# Patient Record
Sex: Female | Born: 1990 | Race: Black or African American | Hispanic: No | Marital: Single | Smoking: Never smoker
Health system: Southern US, Community
[De-identification: ages and names within clinical notes are randomized; demographics above are authoritative.]

## PROBLEM LIST (undated history)

## (undated) DIAGNOSIS — R4589 Other symptoms and signs involving emotional state: Secondary | ICD-10-CM

## (undated) DIAGNOSIS — R4689 Other symptoms and signs involving appearance and behavior: Secondary | ICD-10-CM

## (undated) DIAGNOSIS — R569 Unspecified convulsions: Secondary | ICD-10-CM

## (undated) DIAGNOSIS — F319 Bipolar disorder, unspecified: Secondary | ICD-10-CM

## (undated) DIAGNOSIS — I10 Essential (primary) hypertension: Secondary | ICD-10-CM

## (undated) DIAGNOSIS — G809 Cerebral palsy, unspecified: Secondary | ICD-10-CM

---

## 2017-07-10 ENCOUNTER — Emergency Department (HOSPITAL_COMMUNITY)
Admission: EM | Admit: 2017-07-10 | Discharge: 2017-07-12 | Disposition: A | Discharge status: Home / Self Care | Payer: Medicaid Other | Attending: Emergency Medicine | Admitting: Emergency Medicine

## 2017-07-10 ENCOUNTER — Encounter (HOSPITAL_COMMUNITY): Payer: Self-pay | Admitting: Nurse Practitioner

## 2017-07-10 ENCOUNTER — Other Ambulatory Visit: Payer: Self-pay

## 2017-07-10 DIAGNOSIS — R569 Unspecified convulsions: Secondary | ICD-10-CM | POA: Insufficient documentation

## 2017-07-10 DIAGNOSIS — G809 Cerebral palsy, unspecified: Secondary | ICD-10-CM | POA: Diagnosis not present

## 2017-07-10 DIAGNOSIS — R45851 Suicidal ideations: Secondary | ICD-10-CM | POA: Diagnosis not present

## 2017-07-10 DIAGNOSIS — Z008 Encounter for other general examination: Secondary | ICD-10-CM | POA: Diagnosis not present

## 2017-07-10 DIAGNOSIS — F23 Brief psychotic disorder: Secondary | ICD-10-CM | POA: Diagnosis not present

## 2017-07-10 DIAGNOSIS — F4325 Adjustment disorder with mixed disturbance of emotions and conduct: Secondary | ICD-10-CM | POA: Diagnosis not present

## 2017-07-10 DIAGNOSIS — I10 Essential (primary) hypertension: Secondary | ICD-10-CM | POA: Insufficient documentation

## 2017-07-10 DIAGNOSIS — R51 Headache: Secondary | ICD-10-CM | POA: Diagnosis not present

## 2017-07-10 DIAGNOSIS — R462 Strange and inexplicable behavior: Secondary | ICD-10-CM | POA: Diagnosis present

## 2017-07-10 HISTORY — DX: Cerebral palsy, unspecified: G80.9

## 2017-07-10 HISTORY — DX: Unspecified convulsions: R56.9

## 2017-07-10 HISTORY — DX: Essential (primary) hypertension: I10

## 2017-07-10 LAB — COMPREHENSIVE METABOLIC PANEL
ALT: 18 U/L (ref 14–54)
AST: 20 U/L (ref 15–41)
Albumin: 3 g/dL — ABNORMAL LOW (ref 3.5–5.0)
Alkaline Phosphatase: 48 U/L (ref 38–126)
Anion gap: 9 (ref 5–15)
BILIRUBIN TOTAL: 0.2 mg/dL — AB (ref 0.3–1.2)
BUN: 9 mg/dL (ref 6–20)
CHLORIDE: 108 mmol/L (ref 101–111)
CO2: 21 mmol/L — ABNORMAL LOW (ref 22–32)
CREATININE: 0.85 mg/dL (ref 0.44–1.00)
Calcium: 8.4 mg/dL — ABNORMAL LOW (ref 8.9–10.3)
GFR calc Af Amer: >60 mL/min (ref >60)
GFR calc non Af Amer: >60 mL/min (ref >60)
GLUCOSE: 86 mg/dL (ref 65–99)
POTASSIUM: 3.7 mmol/L (ref 3.5–5.1)
Sodium: 138 mmol/L (ref 135–145)
Total Protein: 6.8 g/dL (ref 6.5–8.1)

## 2017-07-10 LAB — URINALYSIS, ROUTINE W REFLEX MICROSCOPIC
BILIRUBIN URINE: NEGATIVE
Glucose, UA: NEGATIVE mg/dL
Ketones, ur: NEGATIVE mg/dL
Leukocytes, UA: NEGATIVE
Nitrite: NEGATIVE
Protein, ur: 30 mg/dL — AB
RBC / HPF: >50 RBC/hpf — ABNORMAL HIGH (ref 0–5)
SPECIFIC GRAVITY, URINE: 1.02 (ref 1.005–1.030)
pH: 5 (ref 5.0–8.0)

## 2017-07-10 LAB — CBC
HEMATOCRIT: 33 % — AB (ref 36.0–46.0)
Hemoglobin: 10.7 g/dL — ABNORMAL LOW (ref 12.0–15.0)
MCH: 26.4 pg (ref 26.0–34.0)
MCHC: 32.4 g/dL (ref 30.0–36.0)
MCV: 81.5 fL (ref 78.0–100.0)
Platelets: 206 10*3/uL (ref 150–400)
RBC: 4.05 MIL/uL (ref 3.87–5.11)
RDW: 13.3 % (ref 11.5–15.5)
WBC: 7.8 10*3/uL (ref 4.0–10.5)

## 2017-07-10 LAB — RAPID URINE DRUG SCREEN, HOSP PERFORMED
AMPHETAMINES: NOT DETECTED
BARBITURATES: NOT DETECTED
BENZODIAZEPINES: NOT DETECTED
Cocaine: NOT DETECTED
Opiates: NOT DETECTED
TETRAHYDROCANNABINOL: NOT DETECTED

## 2017-07-10 LAB — I-STAT BETA HCG BLOOD, ED (MC, WL, AP ONLY)

## 2017-07-10 LAB — SALICYLATE LEVEL: Salicylate Lvl: <7 mg/dL (ref 2.8–30.0)

## 2017-07-10 LAB — ETHANOL: Alcohol, Ethyl (B): <10 mg/dL (ref <10)

## 2017-07-10 LAB — ACETAMINOPHEN LEVEL: Acetaminophen (Tylenol), Serum: <10 ug/mL — ABNORMAL LOW (ref 10–30)
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
## 2017-07-10 NOTE — ED Notes (Signed)
Bed: WA30 Expected date:  Expected time:  Means of arrival:  Comments: Hall D 

## 2017-07-10 NOTE — ED Notes (Signed)
Case manager [phone] birindia

## 2017-07-10 NOTE — ED Notes (Signed)
TTS being completed. 

## 2017-07-10 NOTE — BH Assessment (Signed)
TTS attempted to contact case manager to get collateral information.  There was no answer and there was not a voicemail.  Will attempt to contact case manager at a later time.
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
## 2017-07-11 DIAGNOSIS — G40909 Epilepsy, unspecified, not intractable, without status epilepticus: Secondary | ICD-10-CM

## 2017-07-11 DIAGNOSIS — F4325 Adjustment disorder with mixed disturbance of emotions and conduct: Secondary | ICD-10-CM | POA: Diagnosis present

## 2017-07-11 DIAGNOSIS — Z79899 Other long term (current) drug therapy: Secondary | ICD-10-CM

## 2017-07-11 DIAGNOSIS — G809 Cerebral palsy, unspecified: Secondary | ICD-10-CM

## 2017-07-11 MED ORDER — ACETAMINOPHEN 325 MG PO TABS
650.0000 mg | ORAL_TABLET | Freq: Once | ORAL | Status: AC
  Administered 2017-07-11: 650 mg via ORAL
  Filled 2017-07-11: qty 2

## 2017-07-11 MED ORDER — GABAPENTIN 400 MG PO CAPS: 400.0000 mg | ORAL_CAPSULE | Freq: Three times a day (TID) | ORAL | 0 refills | Status: DC

## 2017-07-11 MED ORDER — GABAPENTIN 400 MG PO CAPS
400.0000 mg | ORAL_CAPSULE | Freq: Three times a day (TID) | ORAL | Status: DC
  Administered 2017-07-11 (×3): 400 mg via ORAL
  Filled 2017-07-11 (×3): qty 1

## 2017-07-11 NOTE — ED Notes (Signed)
Made EDP Jeraldine Loots aware that pt has headache and would like medication for it. Verbal order for Tylenol  PO given.

## 2017-07-11 NOTE — Discharge Instructions (Signed)
It was our pleasure to provide your ER care today - we hope that you feel better.  For mental health issues and/or crisis, go directly to [facility]. Also see resource guide provided for additional community resources.   Also follow up with primary care doctor in the coming week.   Return to ER if worse, new symptoms, fevers, worsening condition, other concern.
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
## 2017-07-11 NOTE — ED Notes (Signed)
[employer] notified of need for [employer] officer to do wellness check to the home of the patient to notify them that they must come get the patient.

## 2017-07-11 NOTE — Progress Notes (Addendum)
CSW awaiting return call from [facility] officer Floreen Comber as to whether [employer] has made family aware pt needs to be picked up.   [employer] officer Lucas Mallow can be reached at ph: [phone].  Pt's correct address is:  [address], [place] [address]  Pt's correct name is: Patricia [name] 11-02-1990 CSW will continue to follow for D/C needs.  CSW updated APS/CPS worker.  Dorothe Pea. Litzi Binning, LCSW, LCAS, CSI Clinical Social Worker Ph: [phone]

## 2017-07-11 NOTE — ED Notes (Signed)
NT made this RN aware while pt was using [employer] with speaking with social worker patient mentioned she would like medication for her headache.

## 2017-07-11 NOTE — ED Notes (Signed)
[employer] officer called TCU unit to Archivist that a wellness check was done and no family members were home at this time.

## 2017-07-11 NOTE — ED Notes (Signed)
Nanine Means, NP, said that pt might benefit from a Social Work Consult in the morning.
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
## 2017-07-11 NOTE — ED Notes (Signed)
Spoke with Dr. Denton Lank who said that pt can be discharged with outpt resources. She has had a Psychiatric, SW and TSS consult today. Outpt resources provided by Selena Batten with TTS.
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
## 2017-07-11 NOTE — ED Notes (Signed)
Christiane Ha Child psychotherapist at bedside with [employer]

## 2017-07-11 NOTE — Progress Notes (Addendum)
CSW spoke to [employer] who stated pt's brother is finding transporation to pick the pt now but [employer] is unsure of when it will exactly be.  If brother does not pick up, please call [phone] option 1 then option 3 and ask [employer] Dispatch to send a welfare check to pt's home to tell them to tell the pt's family up.  RN updated.   Please reconsult if future social work needs arise.  CSW signing off, as social work intervention is no longer needed.  Dorothe Pea. Brittne Kawasaki, LCSW, LCAS, CSI Clinical Social Worker Ph: [phone]
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
## 2017-07-11 NOTE — ED Notes (Addendum)
TTS at bedside with [employer]
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
## 2017-07-11 NOTE — ED Notes (Signed)
Patient resting in bed with her eyes closed. No signs of distress noted at this time. Sitter at bedside for safety.
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
## 2017-07-12 MED ORDER — CARBAMAZEPINE ER 200 MG PO TB12
400.0000 mg | ORAL_TABLET | Freq: Two times a day (BID) | ORAL | Status: DC
  Administered 2017-07-12: 400 mg via ORAL
  Filled 2017-07-12: qty 2

## 2017-07-12 MED ORDER — CARBAMAZEPINE ER 200 MG PO TB12: 200.0000 mg | ORAL_TABLET | Freq: Two times a day (BID) | ORAL | Status: DC

## 2017-07-12 NOTE — Progress Notes (Signed)
CSW contacted by [employer] [phone]) and informed that he made contact with Mymoni [phone]) at patient's residence and he agreed to pick patient up. Officer reported that he is uncertain of Mymoni's relationship to patient, noting he was the only one that spoke [place].   Patient is to be picked up by Mymoni [phone]). CSW updated patient's RN. CSW signing off, no other needs identified at this time.  Celso Sickle, [place] Clinical Social Worker [facility] Cell#: [phone]

## 2017-07-12 NOTE — Progress Notes (Signed)
Per chart review, patient was assessed by [facility] DSS worker on 07/11/17. Per chart review, "DSS worker has completed assessment of pts home and that it seems like a safe environment". Per chart review, multiple wellness checks have been performed to try to obtain transportation for patient.   CSW followed up with [facility] DSS worker (Mr. Manuela Neptune [phone]) that interviewed patient on 07/11/17 to clarify patient's ability to return home.  DSS worker reported that he obtained patient's mental health hx from family and reported that he feels patient needs a full psychiatric evaluation. CSW explained that patient was seen by psychiatrist and psychiatrically cleared. Per Mr. Freada Bergeron patient's family reported that patient has not been taking medicine and has been showing bizarre behaviors such as stripping. DSS worker reported that patient's home environment is suitable for patient to return home but does not feel patient is ready to return home due to current mental status.   CSW updated psychiatrist Jonnalagadda of reports from [facility] Worker, psychiatrist reported that patient has shown no behaviors at the hospital and there is no reason to re-evaluate patient. Per Psychiatrist Jonnalagadda, patient remains psychiatrically cleared. Patient to discharge home when family can be notified to pick patient up.  CSW contacted [employer] ([phone]) to conduct a wellness check to try to notify patient's family to obtain transportation for patient to discharge back home.   CSW awaiting return call from [facility] officer conducting wellness check.  CSW will continue to try to find transportation for patient to return home.  Celso Sickle, [place] Clinical Social Worker [facility] Cell#: [phone]

## 2017-07-14 ENCOUNTER — Other Ambulatory Visit: Payer: Self-pay

## 2017-07-14 ENCOUNTER — Ambulatory Visit (INDEPENDENT_AMBULATORY_CARE_PROVIDER_SITE_OTHER): Payer: Self-pay | Admitting: Family Medicine

## 2017-07-14 DIAGNOSIS — Z789 Other specified health status: Secondary | ICD-10-CM

## 2017-07-14 DIAGNOSIS — B1910 Unspecified viral hepatitis B without hepatic coma: Secondary | ICD-10-CM

## 2017-07-14 DIAGNOSIS — G40909 Epilepsy, unspecified, not intractable, without status epilepticus: Secondary | ICD-10-CM

## 2017-07-14 DIAGNOSIS — Z758 Other problems related to medical facilities and other health care: Secondary | ICD-10-CM

## 2017-07-14 DIAGNOSIS — Z0289 Encounter for other administrative examinations: Secondary | ICD-10-CM

## 2017-07-14 DIAGNOSIS — F819 Developmental disorder of scholastic skills, unspecified: Secondary | ICD-10-CM

## 2017-07-14 NOTE — Patient Instructions (Addendum)
It was good to see you today.  Come back and see me in about 1 month.    We will discuss your labs at that time from the Health Department.    I have referred you to a brain doctor.

## 2017-07-16 DIAGNOSIS — Z8619 Personal history of other infectious and parasitic diseases: Secondary | ICD-10-CM | POA: Insufficient documentation

## 2017-07-16 DIAGNOSIS — F819 Developmental disorder of scholastic skills, unspecified: Secondary | ICD-10-CM | POA: Insufficient documentation

## 2017-07-16 DIAGNOSIS — B1910 Unspecified viral hepatitis B without hepatic coma: Secondary | ICD-10-CM | POA: Insufficient documentation

## 2017-07-16 DIAGNOSIS — G40909 Epilepsy, unspecified, not intractable, without status epilepticus: Secondary | ICD-10-CM | POA: Insufficient documentation

## 2017-07-16 DIAGNOSIS — Z0289 Encounter for other administrative examinations: Secondary | ICD-10-CM | POA: Insufficient documentation

## 2017-07-16 DIAGNOSIS — Z789 Other specified health status: Secondary | ICD-10-CM | POA: Insufficient documentation

## 2017-07-16 NOTE — Assessment & Plan Note (Addendum)
On tegretol.  Awaiting carbamazepine level.  Will refer to neurology for any further recommendations as to her seizure regimen.  Not controlled, last was just prior to arriving in [name].  - sounds like true seizures -- tonic-clonic movements, LOC, post-ictal state afterwards.
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
## 2017-07-16 NOTE — Assessment & Plan Note (Addendum)
Seen at ED and diagnosed with adjustment disorder.  Prescribed Gapapentin but they never picked this up.  Has been doing better with transition since then per brother.   - Awaiting paperwork from health department.   - Per paperwork she was treated prophylactically and appropriately with Coartem for malaria, praziquantel for schisto, and albendazole for GI parasites.  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  If high absolute eosinophil count, will check titers for this.

## 2017-07-16 NOTE — Assessment & Plan Note (Signed)
Per overseas paperwork.  Awaiting lab work from health dept -- just obtained, not repeating yet so as not to duplicate labs.  May repeat next visit in 4 weeks pending paperwork.

## 2017-07-16 NOTE — Assessment & Plan Note (Signed)
Overall, mostly nonverbal due to cognitive delays.  However, can answer yes or no questions with [place] interpreter.
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
## 2017-07-17 ENCOUNTER — Telehealth: Payer: Self-pay

## 2017-07-17 NOTE — Telephone Encounter (Signed)
Maren Reamer, [facility] nurse assigned to [facility], would like to speak to PCP asap related to this patient and a seizure she witnessed while in the home this morning. She needs some guidance from PCP.  Call back is [phone].  Ples Specter, RN [facility] RN)

## 2017-07-18 NOTE — Telephone Encounter (Signed)
Attempted to call Marisue Humble back but had to leave voicemail.  Told her to send me an email (she has mine) or call when clinic reopens on Tuesday.  If sz's become recurrent or worsen, recommend patient go to ED to be evaluated.

## 2017-08-04 ENCOUNTER — Other Ambulatory Visit: Payer: Self-pay

## 2017-08-04 ENCOUNTER — Ambulatory Visit (INDEPENDENT_AMBULATORY_CARE_PROVIDER_SITE_OTHER): Payer: Medicaid Other | Admitting: Family Medicine

## 2017-08-04 ENCOUNTER — Encounter: Payer: Self-pay | Admitting: Family Medicine

## 2017-08-04 VITALS — BP 102/62 | HR 74 | Temp 98.3°F | Ht 62.0 in | Wt 162.8 lb

## 2017-08-04 DIAGNOSIS — G40909 Epilepsy, unspecified, not intractable, without status epilepticus: Secondary | ICD-10-CM | POA: Diagnosis not present

## 2017-08-04 DIAGNOSIS — Z0289 Encounter for other administrative examinations: Secondary | ICD-10-CM | POA: Diagnosis not present

## 2017-08-04 LAB — POCT URINE PREGNANCY: Preg Test, Ur: NEGATIVE

## 2017-08-04 NOTE — Patient Instructions (Addendum)
It was good to see you today.  Go to your Neurology (brain doctor) appointment this coming Thursday [facility] June [facility] at the scheduled time.

## 2017-08-04 NOTE — Progress Notes (Signed)
Subjective:    Patricia [name] is a 27 y.o. female who [facility] for FU from refugee clinic:  1.  Refugee clinic:  Doing well with transition.  She [facility], who contacted me last week as she is having worsening seizures.  Her brother, who is here with her today, corroborates the story.  Tonic-clonic seizures.  With post-ictal states, tongue chewing, incontinence.  She herself remains essentially nonverbal.  She is taking gabapentin for seizure control but it doesn't sound like this is really helping.    No acute changes other than ongoing seizures.  No fevers or chills.  Family is reportedly doing well with transition to life in the [place].    Her brother has tested positive for hep B.  We do not have health department records.  She has no scleral icterus or jaundice.    ROS as above per HPI.   The following portions of the patient's history were reviewed and updated as appropriate: allergies, current medications, past medical history, family and social history, and problem list. Patient is a nonsmoker.    PMH reviewed.  Past Medical History:  Diagnosis Date  . Cerebral palsy (HCC)   . Hypertension   . Seizures (HCC)    No past surgical history on file.  Medications reviewed. Current Outpatient Medications  Medication Sig Dispense Refill  . gabapentin (NEURONTIN) 400 MG capsule Take 1 capsule (400 mg total) by mouth 3 (three) times daily. 120 capsule 0   No current facility-administered medications for this visit.      Objective:   Physical Exam BP 102/62   Pulse 74   Temp 98.3 F (36.8 C) (Oral)   Ht 5\' 2"  (1.575 m)   Wt 162 lb 12.8 oz (73.8 kg)   LMP 06/11/2017 (Approximate)   SpO2 99%   BMI 29.78 kg/m  Gen:  Alert, cooperative patient who appears stated age in no acute distress.  Vital signs reviewed. HEENT: EOMI,  MMM Cardiac:  Regular rate and rhythm without murmur auscultated.  Good  S1/S2. Pulm:  Clear to auscultation bilaterally  Abd:  Soft/nondistended/nontender.  Good bowel sounds throughout all four quadrants.  No masses noted.  Exts: Non edematous BL  LE, warm and well perfused.  Neuro:  Alert.  Moves all limbs symmetrically.  No focal deficits noted.   No results found for this or any previous visit (from the past 72 hour(s)).

## 2017-08-05 ENCOUNTER — Encounter: Payer: Self-pay | Admitting: Family Medicine

## 2017-08-05 LAB — CBC WITH DIFFERENTIAL/PLATELET
BASOS: 0 %
Basophils Absolute: 0 10*3/uL (ref 0.0–0.2)
EOS (ABSOLUTE): 0.4 10*3/uL (ref 0.0–0.4)
EOS: 8 %
Hematocrit: 36.7 % (ref 34.0–46.6)
Hemoglobin: 11.8 g/dL (ref 11.1–15.9)
IMMATURE GRANS (ABS): 0 10*3/uL (ref 0.0–0.1)
IMMATURE GRANULOCYTES: 0 %
LYMPHS: 37 %
Lymphocytes Absolute: 2 10*3/uL (ref 0.7–3.1)
MCH: 25.8 pg — ABNORMAL LOW (ref 26.6–33.0)
MCHC: 32.2 g/dL (ref 31.5–35.7)
MCV: 80 fL (ref 79–97)
Monocytes Absolute: 0.5 10*3/uL (ref 0.1–0.9)
Monocytes: 9 %
NEUTROS PCT: 46 %
Neutrophils Absolute: 2.4 10*3/uL (ref 1.4–7.0)
PLATELETS: 232 10*3/uL (ref 150–450)
RBC: 4.58 x10E6/uL (ref 3.77–5.28)
RDW: 14.7 % (ref 12.3–15.4)
WBC: 5.3 10*3/uL (ref 3.4–10.8)

## 2017-08-05 LAB — COMPREHENSIVE METABOLIC PANEL
ALT: 18 IU/L (ref 0–32)
AST: 17 IU/L (ref 0–40)
Albumin/Globulin Ratio: 1.2 (ref 1.2–2.2)
Albumin: 4.1 g/dL (ref 3.5–5.5)
Alkaline Phosphatase: 61 IU/L (ref 39–117)
BUN/Creatinine Ratio: 10 (ref 9–23)
BUN: 8 mg/dL (ref 6–20)
Bilirubin Total: <0.2 mg/dL (ref 0.0–1.2)
CALCIUM: 9.2 mg/dL (ref 8.7–10.2)
CO2: 19 mmol/L — AB (ref 20–29)
CREATININE: 0.78 mg/dL (ref 0.57–1.00)
Chloride: 108 mmol/L — ABNORMAL HIGH (ref 96–106)
GFR calc Af Amer: 122 mL/min/{1.73_m2} (ref >59)
GFR, EST NON AFRICAN AMERICAN: 106 mL/min/{1.73_m2} (ref >59)
Globulin, Total: 3.3 g/dL (ref 1.5–4.5)
Glucose: 85 mg/dL (ref 65–99)
Potassium: 4.7 mmol/L (ref 3.5–5.2)
Sodium: 140 mmol/L (ref 134–144)
Total Protein: 7.4 g/dL (ref 6.0–8.5)

## 2017-08-05 LAB — RPR: RPR: NONREACTIVE

## 2017-08-05 LAB — VARICELLA ZOSTER ANTIBODY, IGG: VARICELLA: 830 {index} (ref >165)

## 2017-08-05 LAB — HIV ANTIBODY (ROUTINE TESTING W REFLEX): HIV Screen 4th Generation wRfx: NONREACTIVE

## 2017-08-05 LAB — TSH: TSH: 1.78 u[IU]/mL (ref 0.450–4.500)

## 2017-08-05 LAB — SICKLE CELL SCREEN: SICKLE CELL SCREEN: NEGATIVE

## 2017-08-05 NOTE — Assessment & Plan Note (Signed)
Not controlled.  She has appt scheduled this coming Thursday.  I have contacted the Congregational Nurse team who have ensured they will provide both transportation and interpreter for patient.

## 2017-08-05 NOTE — Assessment & Plan Note (Signed)
As per HPI -- the family seems to be doing well with the transition to the [place].  I see her parents as well.  I have contacted her liaison with Congregational nursing, who has reached out to the Case manager for [employer] regarding epilepsy treatment.

## 2017-08-06 ENCOUNTER — Telehealth: Payer: Self-pay | Admitting: *Deleted

## 2017-08-06 ENCOUNTER — Ambulatory Visit: Payer: Medicaid Other | Admitting: Neurology

## 2017-08-06 NOTE — Telephone Encounter (Signed)
Patient arrived early for her new patient appointment.  She was rescheduled by our office due to [facility] not arriving for the [facility]t.

## 2017-08-07 ENCOUNTER — Encounter: Payer: Self-pay | Admitting: Neurology

## 2017-08-13 ENCOUNTER — Encounter: Payer: Self-pay | Admitting: Neurology

## 2017-08-13 ENCOUNTER — Telehealth: Payer: Self-pay | Admitting: Neurology

## 2017-08-13 ENCOUNTER — Ambulatory Visit (INDEPENDENT_AMBULATORY_CARE_PROVIDER_SITE_OTHER): Payer: Medicaid Other | Admitting: Neurology

## 2017-08-13 VITALS — BP 93/66 | HR 88 | Wt 164.0 lb

## 2017-08-13 DIAGNOSIS — G40909 Epilepsy, unspecified, not intractable, without status epilepticus: Secondary | ICD-10-CM

## 2017-08-13 MED ORDER — LEVETIRACETAM 500 MG PO TABS: 500.0000 mg | ORAL_TABLET | Freq: Two times a day (BID) | ORAL | 3 refills | Status: DC

## 2017-08-13 NOTE — Patient Instructions (Signed)
We will get CT of the head and an EEG study.  We will start Keppra for the seizures.  Look out for drowsiness or irritability or anxiety.

## 2017-08-13 NOTE — Progress Notes (Signed)
Reason for visit: Seizures  Referring physician: Dr. Gwendolyn Grant  Patricia [name] is a 27 y.o. female  History of present illness:  Patricia [name] is a 27 year old left-handed African female with a history of seizures since age 49.  The patient comes in with her brother and an interpreter today.  The seizures were apparently of spontaneous onset, there is no history of head trauma.  There is no family history of seizures.  The patient has been noted to be somewhat cognitively slow throughout her life.  The patient is having about one seizure event a month.  The episodes are associated with dystonic posturing of the fingers of the hands, the patient would then begin to spin around and then she may fall down with a generalized seizure event.  The patient may resolve the seizure within 2 or 3 minutes, it may take her another minute or 2 to start speaking more normally without confusion.  The patient reports dizziness prior to the onset of the seizure.  The patient has no focal numbness or weakness of the face, arms, legs.  She has been on gabapentin taking 400 mg 3 times daily but this has not been effective and fully controlling the seizures.  She apparently has never had a scanning procedure such as CT or MRI of the brain, she has never had an EEG study.  She has 4 brothers and 6 sisters, one brother has hepatitis B, otherwise the siblings are in good health.  The patient comes into the office today for an evaluation.  Past Medical History:  Diagnosis Date  . Cerebral palsy (HCC)   . Hypertension   . Seizures (HCC)     History reviewed. No pertinent surgical history.  Family History  Problem Relation Age of Onset  . Hepatitis Brother     Social history:  reports that she has never smoked. She has never used smokeless tobacco. She reports that she drank alcohol. She reports that she has current or past drug history.  Medications:  Prior to Admission medications   Medication Sig Start Date  End Date Taking? Authorizing Provider  gabapentin (NEURONTIN) 400 MG capsule Take 1 capsule (400 mg total) by mouth 3 (three) times daily. 07/11/17  Yes Charm Rings, NP  levETIRAcetam (KEPPRA) 500 MG tablet Take 1 tablet (500 mg total) by mouth 2 (two) times daily. 08/13/17   York Spaniel, MD     No Known Allergies  ROS:  Out of a complete 14 system review of symptoms, the patient complains only of the following symptoms, and all other reviewed systems are negative.  Memory loss, headache, seizures, tremor Suicidal thoughts, hallucinations  Blood pressure 93/66, pulse 88, weight 164 lb (74.4 kg).  Physical Exam  General: The patient is alert and cooperative at the time of the examination.  Eyes: Pupils are equal, round, and reactive to light. Discs are flat bilaterally.  Neck: The neck is supple, no carotid bruits are noted.  Respiratory: The respiratory examination is clear.  Cardiovascular: The cardiovascular examination reveals a regular rate and rhythm, no obvious murmurs or rubs are noted.  Skin: Extremities are without significant edema.  Neurologic Exam  Mental status: The patient is alert and cooperative, the patient does not speak [place], she seems to have some difficulty following verbal commands.  Cranial nerves: Facial symmetry is present. There is good sensation of the face to pinprick and soft touch bilaterally. The strength of the facial muscles and the muscles to head  turning and shoulder shrug are normal bilaterally. Speech is well enunciated, no aphasia or dysarthria is noted. Extraocular movements are full. Visual fields are full by threat.  When counting fingers, the patient consistently misses the fingers on the right side with double simultaneous stimulation. The tongue is midline, and the patient has symmetric elevation of the soft palate. No obvious hearing deficits are noted.  Motor: The motor testing reveals 5 over 5 strength of all 4 extremities.  Good symmetric motor tone is noted throughout.  Sensory: Sensory testing is intact to pinprick, soft touch and vibration sensation on all 4 extremities. No evidence of extinction is noted.  Coordination: Cerebellar testing reveals good finger-nose-finger and heel-to-shin bilaterally.  Gait and station: Gait is normal. Tandem gait is normal. Romberg is negative. No drift is seen.  Reflexes: Deep tendon reflexes are symmetric and normal bilaterally. Toes are downgoing bilaterally.   Assessment/Plan:  1.  Intractable seizure disorder  2.  Mild mental retardation  The patient could potentially have a relative right homonymous visual field deficit by clinical examination.  The patient will be set up for CT scan of the brain with without contrast, I am not sure the patient will cooperate well enough for MRI evaluation.  The patient will have an EEG study.  She has been on gabapentin which is not a very effective antiepileptic medication.  She will be switched to Keppra taking 500 mg twice daily.  The patient will follow-up in 3 months.  They will call for any dose adjustments of the medication.  The gabapentin will be tapered by 1 capsule every 2 weeks until off the drug.  Marlan Palau. Keith Willis MD 08/13/2017 12:17 PM  [facility] [address] [place] [id number]  Phone [phone] Fax [phone]

## 2017-08-13 NOTE — Telephone Encounter (Signed)
Medicaid order sent to [facility]. They obtain the auth and will reach out to the pt to schedule.  °

## 2017-08-15 ENCOUNTER — Encounter (HOSPITAL_COMMUNITY): Payer: Self-pay | Admitting: Emergency Medicine

## 2017-08-15 ENCOUNTER — Emergency Department (HOSPITAL_COMMUNITY)
Admission: EM | Admit: 2017-08-15 | Discharge: 2017-08-18 | Disposition: A | Discharge status: Home / Self Care | Payer: Medicaid Other | Attending: Emergency Medicine | Admitting: Emergency Medicine

## 2017-08-15 DIAGNOSIS — I10 Essential (primary) hypertension: Secondary | ICD-10-CM | POA: Insufficient documentation

## 2017-08-15 DIAGNOSIS — R4182 Altered mental status, unspecified: Secondary | ICD-10-CM | POA: Diagnosis not present

## 2017-08-15 DIAGNOSIS — G809 Cerebral palsy, unspecified: Secondary | ICD-10-CM | POA: Insufficient documentation

## 2017-08-15 DIAGNOSIS — F4325 Adjustment disorder with mixed disturbance of emotions and conduct: Secondary | ICD-10-CM | POA: Diagnosis not present

## 2017-08-15 DIAGNOSIS — F919 Conduct disorder, unspecified: Secondary | ICD-10-CM | POA: Insufficient documentation

## 2017-08-15 DIAGNOSIS — R462 Strange and inexplicable behavior: Secondary | ICD-10-CM

## 2017-08-15 DIAGNOSIS — F322 Major depressive disorder, single episode, severe without psychotic features: Secondary | ICD-10-CM | POA: Insufficient documentation

## 2017-08-15 DIAGNOSIS — F068 Other specified mental disorders due to known physiological condition: Secondary | ICD-10-CM

## 2017-08-15 DIAGNOSIS — F23 Brief psychotic disorder: Secondary | ICD-10-CM

## 2017-08-15 DIAGNOSIS — F05 Delirium due to known physiological condition: Secondary | ICD-10-CM

## 2017-08-15 LAB — COMPREHENSIVE METABOLIC PANEL
ALT: 16 U/L (ref 14–54)
AST: 18 U/L (ref 15–41)
Albumin: 3.6 g/dL (ref 3.5–5.0)
Alkaline Phosphatase: 57 U/L (ref 38–126)
Anion gap: 4 — ABNORMAL LOW (ref 5–15)
BUN: 14 mg/dL (ref 6–20)
CHLORIDE: 110 mmol/L (ref 101–111)
CO2: 25 mmol/L (ref 22–32)
Calcium: 8.9 mg/dL (ref 8.9–10.3)
Creatinine, Ser: 0.83 mg/dL (ref 0.44–1.00)
Glucose, Bld: 101 mg/dL — ABNORMAL HIGH (ref 65–99)
POTASSIUM: 3.7 mmol/L (ref 3.5–5.1)
Sodium: 139 mmol/L (ref 135–145)
Total Bilirubin: 0.4 mg/dL (ref 0.3–1.2)
Total Protein: 7.4 g/dL (ref 6.5–8.1)

## 2017-08-15 LAB — RAPID URINE DRUG SCREEN, HOSP PERFORMED
AMPHETAMINES: NOT DETECTED
BENZODIAZEPINES: NOT DETECTED
Cocaine: NOT DETECTED
OPIATES: NOT DETECTED
Tetrahydrocannabinol: NOT DETECTED

## 2017-08-15 LAB — SALICYLATE LEVEL

## 2017-08-15 LAB — CBC
HCT: 35.7 % — ABNORMAL LOW (ref 36.0–46.0)
Hemoglobin: 11.8 g/dL — ABNORMAL LOW (ref 12.0–15.0)
MCH: 26.7 pg (ref 26.0–34.0)
MCHC: 33.1 g/dL (ref 30.0–36.0)
MCV: 80.8 fL (ref 78.0–100.0)
PLATELETS: 253 10*3/uL (ref 150–400)
RBC: 4.42 MIL/uL (ref 3.87–5.11)
RDW: 13.6 % (ref 11.5–15.5)
WBC: 6.2 10*3/uL (ref 4.0–10.5)

## 2017-08-15 LAB — I-STAT BETA HCG BLOOD, ED (MC, WL, AP ONLY): I-stat hCG, quantitative: <5 m[IU]/mL (ref <5)

## 2017-08-15 LAB — ACETAMINOPHEN LEVEL: Acetaminophen (Tylenol), Serum: <10 ug/mL — ABNORMAL LOW (ref 10–30)

## 2017-08-15 LAB — ETHANOL
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
## 2017-08-15 NOTE — ED Notes (Signed)
Patient doesn't respond on [employer] interpreter.

## 2017-08-16 ENCOUNTER — Emergency Department (HOSPITAL_COMMUNITY): Payer: Medicaid Other

## 2017-08-16 DIAGNOSIS — F23 Brief psychotic disorder: Secondary | ICD-10-CM

## 2017-08-16 DIAGNOSIS — F068 Other specified mental disorders due to known physiological condition: Secondary | ICD-10-CM

## 2017-08-16 DIAGNOSIS — F05 Delirium due to known physiological condition: Secondary | ICD-10-CM

## 2017-08-16 LAB — POC URINE PREG, ED: PREG TEST UR: NEGATIVE

## 2017-08-16 MED ORDER — HALOPERIDOL LACTATE 2 MG/ML PO CONC
0.5000 mg | Freq: Once | ORAL | Status: AC
  Administered 2017-08-16: 0.5 mg via ORAL
  Filled 2017-08-16: qty 0.3

## 2017-08-16 MED ORDER — HALOPERIDOL LACTATE 2 MG/ML PO CONC
0.5000 mg | Freq: Two times a day (BID) | ORAL | Status: DC
  Administered 2017-08-17 – 2017-08-18 (×3): 0.5 mg via ORAL
  Filled 2017-08-16 (×3): qty 0.3

## 2017-08-16 MED ORDER — HALOPERIDOL 0.5 MG PO TABS
0.5000 mg | ORAL_TABLET | Freq: Two times a day (BID) | ORAL | Status: DC
  Administered 2017-08-16: 0.5 mg via ORAL
  Filled 2017-08-16 (×4): qty 1

## 2017-08-16 MED ORDER — GABAPENTIN 400 MG PO CAPS
400.0000 mg | ORAL_CAPSULE | Freq: Three times a day (TID) | ORAL | Status: DC
  Administered 2017-08-16 – 2017-08-18 (×8): 400 mg via ORAL
  Filled 2017-08-16 (×8): qty 1

## 2017-08-16 MED ORDER — LEVETIRACETAM 500 MG PO TABS
500.0000 mg | ORAL_TABLET | Freq: Two times a day (BID) | ORAL | Status: DC
  Administered 2017-08-16 – 2017-08-18 (×5): 500 mg via ORAL
  Filled 2017-08-16 (×5): qty 1
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
## 2017-08-16 NOTE — ED Notes (Signed)
Pt states "there is nothing wrong with me, I was getting away from my mother and brothers." Asked to further explain, she states, "they are trying to hurt me." Denies pain. Declines answering if she is taking her psychiatric medicines, denies prior reports of "wanting to kill herself." Denies hearing voices. She is requesting a blanket and somewhere to sleep.
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
## 2017-08-16 NOTE — BH Assessment (Signed)
[facility]     08/16/17 Per Dr Jannifer FranklinAkintayo, patient will continued to be observed and monitored overnight for safety overnight with possible for discharge on 6/24 if her condition has improved

## 2017-08-17 DIAGNOSIS — F068 Other specified mental disorders due to known physiological condition: Secondary | ICD-10-CM | POA: Diagnosis not present

## 2017-08-17 NOTE — ED Notes (Signed)
Sleeping, only gets up to take medications
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
## 2017-08-18 DIAGNOSIS — F4325 Adjustment disorder with mixed disturbance of emotions and conduct: Secondary | ICD-10-CM | POA: Diagnosis not present

## 2017-08-18 MED ORDER — HALOPERIDOL LACTATE 2 MG/ML PO CONC: 0.5000 mg | Freq: Two times a day (BID) | ORAL | 0 refills | Status: DC
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
## 2017-08-18 NOTE — Discharge Instructions (Signed)
For your behavioral health needs, you are advised to continue treatment with your current outpatient provider.  If you do not have a provider, contact [facility].  New and returning patients are seen at their walk-in clinic.  Walk-in hours are Monday - Friday from 8:00 am - 3:00 pm.  Walk-in patients are seen on a first come, first served basis.  Try to arrive as early as possible for he best chance of being seen the same day:       [facility]      [address]      [place], [place] [address]      [phone]

## 2017-08-18 NOTE — BHH Suicide Risk Assessment (Signed)
Suicide Risk Assessment  Discharge Assessment   [facility]   Principal Problem: Adjustment disorder with mixed disturbance of emotions and conduct Discharge Diagnoses:  Patient Active Problem List   Diagnosis Date Noted  . Adjustment disorder with mixed disturbance of emotions and conduct [F43.25] 07/11/2017    Priority: High  . Post-ictal confusion [F05] 08/16/2017  . 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 Refugee health examination [Z02.89] 07/16/2017  . Language barrier [Z78.9] 07/16/2017  . Cognitive developmental delay [F81.9] 07/16/2017  . Seizure disorder ([facility]) [G40.909] 07/16/2017  . Hepatitis B [B19.10] 07/16/2017    Total Time spent with patient: 45 minutes  Musculoskeletal: Strength & Muscle Tone: within normal limits Gait & Station: normal Patient leans: N/A  Psychiatric Specialty Exam: Physical Exam  Nursing note and vitals reviewed. Constitutional: She is oriented to person, place, and time. She appears well-developed and well-nourished.  HENT:  Head: Normocephalic.  Neck: Normal range of motion.  Respiratory: Effort normal.  Musculoskeletal: Normal range of motion.  Neurological: She is alert and oriented to person, place, and time.  Psychiatric: She has a normal mood and affect. Her speech is normal and behavior is normal. Judgment and thought content normal. Cognition and memory are impaired.    Review of Systems  Psychiatric/Behavioral:       IDD, CP, and seizure d/o  All other systems reviewed and are negative.   Blood pressure (!) 94/51, pulse 71, temperature 97.9 F (36.6 C), temperature source Oral, resp. rate 16, SpO2 97 %.There is no height or weight on file to calculate BMI.  General Appearance: Casual  Eye Contact:  Good  Speech:  Normal Rate  Volume:  Normal  Mood:  Euthymic  Affect:  Congruent  Thought Process:  At her baseline  Orientation:  Other:  person  Thought Content:  WDL  Suicidal Thoughts:  No   Homicidal Thoughts:  No  Memory:  Immediate;   Fair Recent;   Fair Remote;   Fair  Judgement:  Fair  Insight:  Fair  Psychomotor Activity:  Decreased  Concentration:  Concentration: Fair and Attention Span: Fair  Recall:  FiservFair  Fund of Knowledge:  Fair  Language:  Fair  Akathisia:  No  Handed:  Right  AIMS (if indicated):     Assets:  Housing Leisure Time Physical Health Resilience Social Support  ADL's:  Intact  Cognition:  Impaired,  Moderate  Sleep:      Mental Status Per Nursing Assessment::   On Admission:   bizarre behaviors  Demographic Factors:  Adolescent or young adult  Loss Factors: NA  Historical Factors: NA  Risk Reduction Factors:   Sense of responsibility to family, Living with another person, especially a relative and Positive social support  Continued Clinical Symptoms:  None  Cognitive Features That Contribute To Risk:  None    Suicide Risk:  Minimal: No identifiable suicidal ideation.  Patients presenting with no risk factors but with morbid ruminations; may be classified as minimal risk based on the severity of the depressive symptoms    Plan Of Care/Follow-up recommendations:  Activity:  as tolerated Diet:  heart healthy diet  Omelia Marquart, NP 08/18/2017, 11:54 AM

## 2017-08-18 NOTE — ED Notes (Signed)
Belongings given back to pt from locker 29, one bag

## 2017-08-18 NOTE — Consult Note (Addendum)
[facility] Face-to-Face Psychiatry Consult   Reason for Consult:  Bizarre behaviors Referring Physician:  EDP Patient Identification: Patricia [name] MRN:  [id number] Principal Diagnosis: Adjustment disorder with mixed disturbance of emotions and conduct Diagnosis:   Patient Active Problem List   Diagnosis Date Noted  . Adjustment disorder with mixed disturbance of emotions and conduct [F43.25] 07/11/2017    Priority: High  . Post-ictal confusion [F05] 08/16/2017  . 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 Refugee health examination [Z02.89] 07/16/2017  . Language barrier [Z78.9] 07/16/2017  . Cognitive developmental delay [F81.9] 07/16/2017  . Seizure disorder (HCC) [G40.909] 07/16/2017  . Hepatitis B [B19.10] 07/16/2017    Total Time spent with patient: 30 minutes  Subjective:   Patricia [name] is a 27 y.o. female patient has stabilized.  HPI: 27 yo female who presented to the ED after bizarre behaviors.  Medications were started and she stabilized.  She was believed to be in a post-tical state on admission.  Compliant with medications with no threats to self or others, negative drug use.  Stable to return to the care of her family.  Past Psychiatric History: IDD, seizure disorder  Risk to Self: None Risk to Others: Homicidal Ideation: No Thoughts of Harm to Others: No Current Homicidal Intent: No Current Homicidal Plan: No Access to Homicidal Means: No Identified Victim: none History of harm to others?: No Assessment of Violence: None Noted Violent Behavior Description: none Does patient have access to weapons?: No Criminal Charges Pending?: No Does patient have a court date: No Prior Inpatient Therapy: Prior Inpatient Therapy: No Prior Outpatient Therapy: Prior Outpatient Therapy: (UTA) Does patient have an ACCT team?: No Does patient have Intensive In-House Services?  : No Does patient have [facility] services? : No Does patient have P4CC services?: No  Past Medical  History:  Past Medical History:  Diagnosis Date  . Cerebral palsy (HCC)   . Hypertension   . Seizures (HCC)    History reviewed. No pertinent surgical history. Family History:  Family History  Problem Relation Age of Onset  . Hepatitis Brother    Family Psychiatric  History: none Social History:  Social History   Substance and Sexual Activity  Alcohol Use Not Currently     Social History   Substance and Sexual Activity  Drug Use Not Currently    Social History   Socioeconomic History  . Marital status: Single    Spouse name: Not on file  . Number of children: 1  . Years of education: Not on file  . Highest education level: Not on file  Occupational History    Comment: none  Social Needs  . Financial resource strain: Not on file  . Food insecurity:    Worry: Not on file    Inability: Not on file  . Transportation needs:    Medical: Not on file    Non-medical: Not on file  Tobacco Use  . Smoking status: Never Smoker  . Smokeless tobacco: Never Used  Substance and Sexual Activity  . Alcohol use: Not Currently  . Drug use: Not Currently  . Sexual activity: Not on file  Lifestyle  . Physical activity:    Days per week: Not on file    Minutes per session: Not on file  . Stress: Not on file  Relationships  . Social connections:    Talks on phone: Not on file    Gets together: Not on file    Attends religious service: Not on file  Active member of club or organization: Not on file    Attends meetings of clubs or organizations: Not on file    Relationship status: Not on file  Other Topics Concern  . Not on file  Social History Narrative   Lives with mother, family   Additional Social History:N/A    Allergies:  No Known Allergies  Labs: No results found for this or any previous visit (from the past 48 hour(s)).  Current Facility-Administered Medications  Medication Dose Route Frequency Provider Last Rate Last Dose  . gabapentin (NEURONTIN) capsule  400 mg  400 mg Oral TID Nira Conn, MD   400 mg at 08/18/17 0947  . haloperidol (HALDOL) 2 MG/ML solution 0.5 mg  0.5 mg Oral BID Thedore Mins, MD   0.5 mg at 08/18/17 0946  . levETIRAcetam (KEPPRA) tablet 500 mg  500 mg Oral BID Nira Conn, MD   500 mg at 08/18/17 1610   Current Outpatient Medications  Medication Sig Dispense Refill  . gabapentin (NEURONTIN) 400 MG capsule Take 1 capsule (400 mg total) by mouth 3 (three) times daily. 120 capsule 0  . levETIRAcetam (KEPPRA) 500 MG tablet Take 1 tablet (500 mg total) by mouth 2 (two) times daily. 60 tablet 3    Musculoskeletal: Strength & Muscle Tone: within normal limits Gait & Station: normal Patient leans: N/A  Psychiatric Specialty Exam: Physical Exam  Nursing note and vitals reviewed. Constitutional: She is oriented to person, place, and time. She appears well-developed and well-nourished.  HENT:  Head: Normocephalic.  Neck: Normal range of motion.  Respiratory: Effort normal.  Musculoskeletal: Normal range of motion.  Neurological: She is alert and oriented to person, place, and time.  Psychiatric: She has a normal mood and affect. Her speech is normal and behavior is normal. Judgment and thought content normal. Cognition and memory are impaired.    Review of Systems  Psychiatric/Behavioral: Negative for suicidal ideas.       IDD by history, CP, and seizure d/o  All other systems reviewed and are negative.   Blood pressure (!) 94/51, pulse 71, temperature 97.9 F (36.6 C), temperature source Oral, resp. rate 16, SpO2 97 %.There is no height or weight on file to calculate BMI.  General Appearance: Casual  Eye Contact:  Good  Speech:  Normal Rate  Volume:  Normal  Mood:  Euthymic  Affect:  Congruent  Thought Process:  At her baseline  Orientation:  Other:  person  Thought Content:  WDL  Suicidal Thoughts:  No  Homicidal Thoughts:  No  Memory:  Immediate;   Fair Recent;   Fair Remote;    Fair  Judgement:  Fair  Insight:  Fair  Psychomotor Activity:  Decreased  Concentration:  Concentration: Fair and Attention Span: Fair  Recall:  [employer] of Knowledge:  Fair  Language:  Fair  Akathisia:  No  Handed:  Right  AIMS (if indicated):   N/A  Assets:  Housing Leisure Time Resilience Social Support  ADL's:  Intact  Cognition:  Impaired,  Moderate  Sleep:   N/A     Treatment Plan Summary: Adjustment disorder with mixed disturbance of emotions and conduct: Started Haldol 0.5 mg BID for mood stabilization  Disposition: No evidence of imminent risk to self or others at present.    Nanine Means, NP 08/18/2017 11:14 AM   Patient seen face-to-face for psychiatric evaluation, chart reviewed and case discussed with the physician extender and developed treatment plan. Reviewed the information  documented and agree with the treatment plan.  Juanetta BeetsJacqueline Gus Littler, DO 08/18/17 5:07 PM

## 2017-08-18 NOTE — Progress Notes (Addendum)
11:47am- CSW spoke with pt's brother via [employer] [phone]. CSW was informed by Crissie ReeseHaifa [phone] (intepretor) that brother would be to pick pt up today at 3pm. There are no further CSW needs. CSW will sign off.   CSW received call from Tom with TTS informing CSW that [facility]. SCW reached out to number int hr chart and left VM asking that this person call CSW back regarding pt. CSW reached out to number in LCSW Note Christiane Ha(Jonathan) but no answer. CSW will continue to reach out to person to pick pt up.   Claude MangesKierra S. Izzabella Besse, MSW, LCSW-A Emergency Department Clinical Social Worker [phone]

## 2017-08-26 ENCOUNTER — Ambulatory Visit: Payer: Medicaid Other

## 2017-09-08 ENCOUNTER — Ambulatory Visit (INDEPENDENT_AMBULATORY_CARE_PROVIDER_SITE_OTHER): Payer: Medicaid Other

## 2017-09-08 DIAGNOSIS — R569 Unspecified convulsions: Secondary | ICD-10-CM | POA: Diagnosis not present

## 2017-09-13 ENCOUNTER — Telehealth: Payer: Self-pay | Admitting: Neurology

## 2017-09-13 NOTE — Procedures (Signed)
     History: Patricia [name] is a 27 year old patient with a history of seizures since age 27.  The seizures were of spontaneous onset, and have been associated with mild mental retardation.  The patient has a history of cerebral palsy.  The episodes are associated with dystonic posturing of the fingers and hands, the patient may fall down with the seizure, she may begin to spin around before she falls.  The patient is being evaluated for these events.  This is a routine EEG.  No skull defects are noted.  Medications include Keppra and gabapentin.  EEG classification: Dysrhythmia grade 2 right hemisphere  Description of the recording: The background rhythm of this recording consists of a somewhat poorly modulated medium amplitude 10 Hz background activity that is reactive to eye opening and closure.  As the record progresses, there appears to be a relatively frequent asymmetry of slowing involving the right hemisphere predominantly with a slightly higher amplitude on the right.  At times, the 3 to 5 Hz slowing becomes much higher in amplitude and more prominent throughout the right hemisphere, at sometimes crossing over to the left temporal region as well.  At no time during the recording does there appear to be spike or spike-wave discharges.  Hyperventilation is performed, this results in a mild elevation and generalized slowing.  Photic stimulation is performed and results in a bilateral and symmetric photic driving response.  EKG monitor shows no evidence of cardiac rhythm abnormalities with a heart rate of 66.  Impression: This is an abnormal EEG recording secondary to right hemispheric slowing.  This study suggests a right brain abnormality with a lowered seizure threshold.  No electrographic seizures were recorded however.

## 2017-09-13 NOTE — Telephone Encounter (Signed)
I called concerning the patient.  The CT of the head was unremarkable, no structural abnormalities that would cause seizures.  The EEG study clearly is abnormal with slowing across the right hemisphere, this is likely the source of the seizures.  The patient will continue the Keppra, if the seizures continue they are to call for dose adjustments, or call if she is not tolerating the medication.  For whatever reason, the CT of the head was not done with contrast.  It was ordered with and without contrast.  It looks as if the CT scan was done through the emergency room.   CT head 08/16/17:  IMPRESSION: 1. No acute intracranial process. 2. Chronic sinus disease as above.   EEG 09/08/17:  Impression: This is an abnormal EEG recording secondary to right hemispheric slowing.  This study suggests a right brain abnormality with a lowered seizure threshold.  No electrographic seizures were recorded however.

## 2017-10-14 ENCOUNTER — Encounter: Payer: Self-pay | Admitting: Licensed Clinical Social Worker

## 2017-10-14 NOTE — Progress Notes (Signed)
Type of Service: Clinical Social Work  Social work consult from Dr. Gwendolyn GrantWalden reference Personal Care Services for patient.   Provider has spoken with patient and family. They are interested in moving forward with applying for the service. LCSW received completed and signed new request form for personal care services from Dr Gwendolyn GrantWalden.   Form [facility] to [employer] [employer] at [phone] by front office staff.   Patricia Hineseborah Lunette Tapp, LCSW Licensed Clinical Social Worker [facility]   [phone] 8:54 AM

## 2017-10-15 ENCOUNTER — Emergency Department (HOSPITAL_COMMUNITY)
Admission: EM | Admit: 2017-10-15 | Discharge: 2017-10-15 | Disposition: A | Discharge status: Home / Self Care | Payer: Medicaid Other | Attending: Emergency Medicine | Admitting: Emergency Medicine

## 2017-10-15 ENCOUNTER — Encounter (HOSPITAL_COMMUNITY): Payer: Self-pay | Admitting: Emergency Medicine

## 2017-10-15 ENCOUNTER — Ambulatory Visit: Payer: Medicaid Other | Admitting: Neurology

## 2017-10-15 ENCOUNTER — Other Ambulatory Visit: Payer: Self-pay

## 2017-10-15 DIAGNOSIS — F99 Mental disorder, not otherwise specified: Secondary | ICD-10-CM | POA: Diagnosis present

## 2017-10-15 DIAGNOSIS — G809 Cerebral palsy, unspecified: Secondary | ICD-10-CM

## 2017-10-15 DIAGNOSIS — I10 Essential (primary) hypertension: Secondary | ICD-10-CM | POA: Diagnosis not present

## 2017-10-15 DIAGNOSIS — F4325 Adjustment disorder with mixed disturbance of emotions and conduct: Secondary | ICD-10-CM | POA: Insufficient documentation

## 2017-10-15 DIAGNOSIS — Z79899 Other long term (current) drug therapy: Secondary | ICD-10-CM | POA: Diagnosis not present

## 2017-10-15 MED ORDER — LEVETIRACETAM 500 MG PO TABS: 500.0000 mg | ORAL_TABLET | Freq: Two times a day (BID) | ORAL | Status: DC

## 2017-10-15 MED ORDER — HALOPERIDOL LACTATE 2 MG/ML PO CONC
0.5000 mg | Freq: Two times a day (BID) | ORAL | Status: DC
  Filled 2017-10-15: qty 0.3

## 2017-10-15 NOTE — ED Triage Notes (Signed)
Pt brought by ambulance and [employer], pt was wondering in street taking to self. Hx psych issues., lives with family . Pt is is confused, word salad. Calm and cooperative at this time. No aggressive behavior with EMS. alert yet disoriented.

## 2017-10-15 NOTE — ED Provider Notes (Signed)
Family arrived and is here to take patient home.  Patient is doing well.  Family member has no concerns.  Patient has cerebral palsy/MR and has been known to walk away from family home.  Patient has no complaints and discharged in good condition.   Virgina NorfolkCuratolo, Wren Pryce, DO 10/15/17 2112

## 2017-10-15 NOTE — ED Notes (Signed)
Brother in department, speaks [place].

## 2017-10-15 NOTE — ED Notes (Signed)
Received return call from RidgeburySari, Case Mgr from [phone].  She stated "I have called her brothers and they are on the way to get her."  Requested she hold on to speak to EDP if needed.  Spoke with Dr. Lockie Molauratolo, he stated "I don't need anything, I'm going to cancel the blood, let me know when her brothers arrive and I will d/c her."  Sari, Case Mgr. Informed of conversation with EDP and verbalized understanding.

## 2017-10-15 NOTE — ED Notes (Signed)
Dalled [phone], received recording.  Message left requesting return call.

## 2017-10-15 NOTE — ED Notes (Signed)
Called [phone], left message requesting return call.

## 2017-10-15 NOTE — ED Notes (Signed)
Pt speaks swahili only, EDP at bedside, spoke to family social worker, pt is known MR , family in route to get pt and no need for psych eval per EDP.

## 2017-10-15 NOTE — ED Notes (Signed)
This nursed tried calling back family friend and no response

## 2017-10-15 NOTE — ED Notes (Signed)
Bed: WA28 Expected date:  Expected time:  Means of arrival:  Comments: EMS-hallucinations 

## 2017-10-15 NOTE — ED Provider Notes (Signed)
Family does not get off work until [employer]5pm. They know to come to the ER   Azalia Bilisampos, Shane Badeaux, MD 10/15/17 1615
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
## 2017-10-15 NOTE — ED Provider Notes (Signed)
[facility]-EMERGENCY DEPT Provider Note   CSN: [id number] Arrival date & time: 10/15/17  1219     History   Chief Complaint Chief Complaint  Patient presents with  . Psychiatric Evaluation    HPI Airanna Cambria Osten is a 27 y.o. female.  HPI 27 year old female who was found wandering in the street speaking to her self.  She is brought to the ER for evaluation.  It is reported that the patient was confused but she speaks Swahili and no interpreter was utilized.  Patient is calm and cooperative at this time.  Review of the chart demonstrates that she has mild MR and a history of cerebral palsy.  She is a recent immigrant to this country.  I spoke with her case manager who will contact family and family and/or the case manager will come and provide additional collateral information here in the emergency department.   Past Medical History:  Diagnosis Date  . Cerebral palsy (HCC)   . Hypertension   . Seizures [facility])     Patient Active Problem List   Diagnosis Date Noted  . Post-ictal confusion 08/16/2017  . 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 Refugee health examination 07/16/2017  . Language barrier 07/16/2017  . Cognitive developmental delay 07/16/2017  . Seizure disorder (HCC) 07/16/2017  . Hepatitis B 07/16/2017  . 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 No pertinent surgical history.   OB History   None      Home Medications    Prior to Admission medications   Medication Sig Start Date End Date Taking? Authorizing Provider  gabapentin (NEURONTIN) 400 MG capsule Take 1 capsule (400 mg total) by mouth 3 (three) times daily. 07/11/17   Charm Rings, NP  haloperidol (HALDOL) 2 MG/ML solution Take 0.3 mLs (0.6 mg total) by mouth 2 (two) times daily. 08/18/17   Charm Rings, NP  levETIRAcetam (KEPPRA) 500 MG tablet Take 1 tablet (500 mg total) by mouth 2 (two) times daily. 08/13/17   York Spaniel, MD    Family History Family History  Problem Relation Age of Onset  . Hepatitis Brother     Social History Social History   Tobacco Use  . Smoking status: Never Smoker  . Smokeless tobacco: Never Used  Substance Use Topics  . Alcohol use: Not Currently  . Drug use: Not Currently     Allergies   Patient has no known allergies.   Review of Systems Review of Systems  All other systems reviewed and are negative.    Physical Exam Updated Vital Signs There were no vitals taken for this visit.  Physical Exam  Constitutional: She is oriented to person, place, and time. She appears well-developed and well-nourished.  HENT:  Head: Normocephalic.  Eyes: EOM are normal.  Neck: Normal range of motion.  Cardiovascular: Normal rate.  Pulmonary/Chest: Effort normal and breath sounds normal.  Abdominal: Soft. She exhibits no distension.  Musculoskeletal: Normal range of motion.  Neurological: She is alert and oriented to person, place, and time.  Psychiatric: She has a normal mood and affect.  Nursing note and vitals reviewed.    ED Treatments / Results  Labs (all labs ordered are listed, but only abnormal results are displayed) Labs Reviewed - No data to display  EKG None  Radiology No results found.  Procedures Procedures (including critical care time)  Medications Ordered in ED Medications - No data to display   Initial  Impression / Assessment and Plan / ED Course  I have reviewed the triage vital signs and the nursing notes.  Pertinent labs & imaging results that were available during my care of the patient were reviewed by me and considered in my medical decision making (see chart for details).     Baseline labs will be obtained.  Awaiting additional information from family who are set to come to the emergency department.  Final Clinical Impressions(s) / ED Diagnoses   Final diagnoses:  None    ED Discharge Orders    None         Azalia Bilisampos, Chandni Gagan, MD 10/15/17 [phone]

## 2017-10-15 NOTE — ED Notes (Addendum)
Spoke to a family member, supposed to come pick patient up when gets out of work at 5 pm. Dr. Patria Maneampos stated to this nurse verbally to have family assess patient and if she is not mentally at her norm then collect labs if not patient can go home

## 2017-10-15 NOTE — ED Notes (Signed)
Called [phone], left message requesting return call.

## 2017-11-30 NOTE — Progress Notes (Signed)
[facility]  PATIENT: Fidencia Mccloud DOB: 31-Aug-1990   REASON FOR VISIT: Follow-up for seizure disorder HISTORY FROM: Interpreter brother and case worker    HISTORY OF PRESENT ILLNESS:UPDATE 10/9/2019CM Ms.Denner, 27 year old female returns for follow-up with a history of seizure disorder since the age of 36.  She speaks Swahili and has an Equities trader.  She also has a history of being cognitively slow.  Her seizure events are associated with dystonic posturing of the fingers in the hands and then fall with a generalized seizure event.  The seizure usually resolves in 2 to 3 minutes.  CT of the head performed 08/16/2017 was normal without acute intracranial process.  EEG performed 09/08/2017 This is an abnormal EEG recording secondary to right hemispheric slowing. This study suggests a right brain abnormality with a lowered seizure threshold. No electrographic seizures were recorded however.  The patient's brother states today that she has had approximately 10 seizures since her last visit here with Dr. Anne Hahn.  Her last one occurred last Thursday.  They have not called into the office to let us know that she was continuing with seizure events.  She returns for reevaluation    08/13/17 KWMs. Gaultney is a 27 year old left-handed African female with a history of seizures since age 66.  The patient comes in with her brother and an interpreter today.  The seizures were apparently of spontaneous onset, there is no history of head trauma.  There is no family history of seizures.  The patient has been noted to be somewhat cognitively slow throughout her life.  The patient is having about one seizure event a month.  The episodes are associated with dystonic posturing of the fingers of the hands, the patient would then begin to spin around and then she may fall down with a generalized seizure event.  The patient may resolve the seizure within 2 or 3 minutes, it may take her another  minute or 2 to start speaking more normally without confusion.  The patient reports dizziness prior to the onset of the seizure.  The patient has no focal numbness or weakness of the face, arms, legs.  She has been on gabapentin taking 400 mg 3 times daily but this has not been effective and fully controlling the seizures.  She apparently has never had a scanning procedure such as CT or MRI of the brain, she has never had an EEG study.  She has 4 brothers and 6 sisters, one brother has hepatitis B, otherwise the siblings are in good health.  The patient comes into the office today for an evaluation.  REVIEW OF SYSTEMS: Full 14 system review of systems performed and notable only for those listed, all others are neg:  Constitutional: Fatigue Cardiovascular: neg Ear/Nose/Throat: neg  Skin: neg Eyes: neg Respiratory: neg Gastroitestinal: neg  Hematology/Lymphatic: neg  Endocrine: neg Musculoskeletal:neg Allergy/Immunology: neg Neurological: Seizure, dizziness Psychiatric: neg Sleep : neg   ALLERGIES: No Known Allergies  HOME MEDICATIONS: Outpatient Medications Prior to Visit  Medication Sig Dispense Refill  . gabapentin (NEURONTIN) 400 MG capsule Take 1 capsule (400 mg total) by mouth 3 (three) times daily. 120 capsule 0  . haloperidol (HALDOL) 2 MG/ML solution Take 0.3 mLs (0.6 mg total) by mouth 2 (two) times daily. 240 mL 0  . levETIRAcetam (KEPPRA) 500 MG tablet Take 1 tablet (500 mg total) by mouth 2 (two) times daily. (Patient taking differently: Take 500 mg by mouth 2 (two) times daily. 1 in the am 2 in  the pm for 1 week then 2 twice daily twelve hours apart) 60 tablet 3   No facility-administered medications prior to visit.     PAST MEDICAL HISTORY: Past Medical History:  Diagnosis Date  . Cerebral palsy (HCC)   . Hypertension   . Seizures (HCC)     PAST SURGICAL HISTORY: History reviewed. No pertinent surgical history.  FAMILY HISTORY: Family History  Problem  Relation Age of Onset  . Hepatitis Brother     SOCIAL HISTORY: Social History   Socioeconomic History  . Marital status: Single    Spouse name: Not on file  . Number of children: 1  . Years of education: Not on file  . Highest education level: Not on file  Occupational History    Comment: none  Social Needs  . Financial resource strain: Not on file  . Food insecurity:    Worry: Not on file    Inability: Not on file  . Transportation needs:    Medical: Not on file    Non-medical: Not on file  Tobacco Use  . Smoking status: Never Smoker  . Smokeless tobacco: Never Used  Substance and Sexual Activity  . Alcohol use: Not Currently  . Drug use: Not Currently  . Sexual activity: Not on file  Lifestyle  . Physical activity:    Days per week: Not on file    Minutes per session: Not on file  . Stress: Not on file  Relationships  . Social connections:    Talks on phone: Not on file    Gets together: Not on file    Attends religious service: Not on file    Active member of club or organization: Not on file    Attends meetings of clubs or organizations: Not on file    Relationship status: Not on file  . Intimate partner violence:    Fear of current or ex partner: Not on file    Emotionally abused: Not on file    Physically abused: Not on file    Forced sexual activity: Not on file  Other Topics Concern  . Not on file  Social History Narrative   Lives with mother, family     PHYSICAL EXAM  Vitals:   12/02/17 1443  BP: 99/66  Pulse: 81  Weight: 170 lb 6.4 oz (77.3 kg)  Height: 5\' 2"  (1.575 m)   Body mass index is 31.17 kg/m.  Generalized: Well developed, in no acute distress  Skin no rash or edema Neurological examination   Mentation: Alert and cooperative she has difficulty following verbal commands.  She does not speak English  Cranial nerve II-XII: Pupils were equal round reactive to light extraocular movements were full, visual field were full ,when  counting fingers, the patient consistently misses the fingers on the right side with double simultaneous stimulation. Facial sensation and strength were normal. hearing was intact to finger rubbing bilaterally. Uvula tongue midline. head turning and shoulder shrug were normal and symmetric.Tongue protrusion into cheek strength was normal. Motor: normal bulk and tone, full strength in the BUE, BLE,  Sensory: normal and symmetric to light touch, pinprick, and  Vibration, in upper and lower extremities Coordination: finger-nose-finger, heel-to-shin bilaterally, no dysmetria Reflexes: Brachioradialis 2/2, biceps 2/2, triceps 2/2, patellar 2/2, Achilles 2/2, plantar responses were flexor bilaterally. Gait and Station: Rising up from seated position without assistance, normal stance,  moderate stride, good arm swing, smooth turning, able to perform tiptoe, and heel walking without difficulty. Tandem gait is steady  DIAGNOSTIC DATA (LABS, IMAGING, TESTING) - I reviewed patient records, labs, notes, testing and imaging myself where available.  Lab Results  Component Value Date   WBC 6.2 08/15/2017   HGB 11.8 (L) 08/15/2017   HCT 35.7 (L) 08/15/2017   MCV 80.8 08/15/2017   PLT 253 08/15/2017      Component Value Date/Time   NA 139 08/15/2017 2241   NA 140 08/04/2017 1111   K 3.7 08/15/2017 2241   CL 110 08/15/2017 2241   CO2 25 08/15/2017 2241   GLUCOSE 101 (H) 08/15/2017 2241   BUN 14 08/15/2017 2241   BUN 8 08/04/2017 1111   CREATININE 0.83 08/15/2017 2241   CALCIUM 8.9 08/15/2017 2241   PROT 7.4 08/15/2017 2241   PROT 7.4 08/04/2017 1111   ALBUMIN 3.6 08/15/2017 2241   ALBUMIN 4.1 08/04/2017 1111   AST 18 08/15/2017 2241   ALT 16 08/15/2017 2241   ALKPHOS 57 08/15/2017 2241   BILITOT 0.4 08/15/2017 2241   BILITOT <0.2 08/04/2017 1111   GFRNONAA >60 08/15/2017 2241   GFRAA >60 08/15/2017 2241    Lab Results  Component Value Date   TSH 1.780 08/04/2017      ASSESSMENT AND  PLAN  27 y.o. year old female  has a past medical history of Cerebral palsy (HCC), Hypertension, and Seizures (HCC). 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  Patient was placed on Keppra when last seen by Dr. Anne Hahn 500 mg twice daily.  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  CT of the head was normal on 08/16/17 EEG performed 09/08/2017 This is an abnormal EEG recording secondary to right hemispheric slowing. This study suggests a right brain abnormality with a lowered seizure threshold. No electrographic seizures were recorded however.  Patient did not taper gabapentin.   PLan: The gabapentin will be tapered by 1 capsule every 2 weeks until off the drug. Increase Keppra to 1 tablet in the morning 2 tablets at night for 1 week then 2 tablets twice a day Follow up in 3 months Call for further seizure activity after being on the drug at new dose Nilda Riggs, [address] [facility], [facility], APRN  [facility] [address] [place], [place] [address] [phone]

## 2017-12-02 ENCOUNTER — Encounter: Payer: Self-pay | Admitting: Nurse Practitioner

## 2017-12-02 ENCOUNTER — Ambulatory Visit: Payer: Medicaid Other | Admitting: Nurse Practitioner

## 2017-12-02 VITALS — BP 99/66 | HR 81 | Ht 62.0 in | Wt 170.4 lb

## 2017-12-02 DIAGNOSIS — Z789 Other specified health status: Secondary | ICD-10-CM

## 2017-12-02 DIAGNOSIS — F819 Developmental disorder of scholastic skills, unspecified: Secondary | ICD-10-CM | POA: Diagnosis not present

## 2017-12-02 DIAGNOSIS — G40909 Epilepsy, unspecified, not intractable, without status epilepticus: Secondary | ICD-10-CM

## 2017-12-02 MED ORDER — LEVETIRACETAM 500 MG PO TABS: 500.0000 mg | ORAL_TABLET | Freq: Two times a day (BID) | ORAL | 3 refills | Status: DC

## 2017-12-02 NOTE — Progress Notes (Signed)
I have read the note, and I agree with the clinical assessment and plan.  Patricia [name]   

## 2017-12-02 NOTE — Patient Instructions (Signed)
Increase Keppra to 1 tablet in the morning 2 tablets at night for 1 week then 2 tablets twice a day Follow up in 3 months Call for further seizure activity after being on the drug at new dose

## 2017-12-08 ENCOUNTER — Emergency Department (HOSPITAL_COMMUNITY)
Admission: EM | Admit: 2017-12-08 | Discharge: 2017-12-10 | Disposition: A | Discharge status: Home / Self Care | Payer: Medicaid Other | Attending: Emergency Medicine | Admitting: Emergency Medicine

## 2017-12-08 ENCOUNTER — Encounter (HOSPITAL_COMMUNITY): Payer: Self-pay | Admitting: Emergency Medicine

## 2017-12-08 ENCOUNTER — Other Ambulatory Visit: Payer: Self-pay

## 2017-12-08 DIAGNOSIS — T7611XA Adult physical abuse, suspected, initial encounter: Secondary | ICD-10-CM | POA: Diagnosis not present

## 2017-12-08 DIAGNOSIS — F329 Major depressive disorder, single episode, unspecified: Secondary | ICD-10-CM | POA: Diagnosis present

## 2017-12-08 DIAGNOSIS — F819 Developmental disorder of scholastic skills, unspecified: Secondary | ICD-10-CM

## 2017-12-08 DIAGNOSIS — F333 Major depressive disorder, recurrent, severe with psychotic symptoms: Secondary | ICD-10-CM | POA: Insufficient documentation

## 2017-12-08 DIAGNOSIS — R45851 Suicidal ideations: Secondary | ICD-10-CM | POA: Diagnosis not present

## 2017-12-08 DIAGNOSIS — R569 Unspecified convulsions: Secondary | ICD-10-CM

## 2017-12-08 LAB — COMPREHENSIVE METABOLIC PANEL
ALK PHOS: 53 U/L (ref 38–126)
ALT: 15 U/L (ref 0–44)
ANION GAP: 7 (ref 5–15)
AST: 18 U/L (ref 15–41)
Albumin: 3.6 g/dL (ref 3.5–5.0)
BILIRUBIN TOTAL: 0.4 mg/dL (ref 0.3–1.2)
BUN: 11 mg/dL (ref 6–20)
CALCIUM: 8.7 mg/dL — AB (ref 8.9–10.3)
CO2: 21 mmol/L — ABNORMAL LOW (ref 22–32)
Chloride: 110 mmol/L (ref 98–111)
Creatinine, Ser: 0.69 mg/dL (ref 0.44–1.00)
GFR calc Af Amer: >60 mL/min (ref >60)
GLUCOSE: 92 mg/dL (ref 70–99)
Potassium: 3.3 mmol/L — ABNORMAL LOW (ref 3.5–5.1)
Sodium: 138 mmol/L (ref 135–145)
TOTAL PROTEIN: 7.1 g/dL (ref 6.5–8.1)

## 2017-12-08 LAB — CBC
HEMATOCRIT: 37.2 % (ref 36.0–46.0)
HEMOGLOBIN: 11.5 g/dL — AB (ref 12.0–15.0)
MCH: 25.9 pg — AB (ref 26.0–34.0)
MCHC: 30.9 g/dL (ref 30.0–36.0)
MCV: 83.8 fL (ref 80.0–100.0)
Platelets: 192 10*3/uL (ref 150–400)
RBC: 4.44 MIL/uL (ref 3.87–5.11)
RDW: 13.5 % (ref 11.5–15.5)
WBC: 7.8 10*3/uL (ref 4.0–10.5)
nRBC: 0 % (ref 0.0–0.2)

## 2017-12-08 LAB — I-STAT BETA HCG BLOOD, ED (MC, WL, AP ONLY): I-stat hCG, quantitative: <5 m[IU]/mL (ref <5)

## 2017-12-08 NOTE — ED Notes (Signed)
Bed: ZO10 Expected date:  Expected time:  Means of arrival:  Comments: EMS 27 yo female mental health issues/SI-only speaks Swahili
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
## 2017-12-09 LAB — SALICYLATE LEVEL: Salicylate Lvl: <7 mg/dL (ref 2.8–30.0)

## 2017-12-09 LAB — RAPID URINE DRUG SCREEN, HOSP PERFORMED
Amphetamines: NOT DETECTED
Barbiturates: NOT DETECTED
Benzodiazepines: NOT DETECTED
Cocaine: NOT DETECTED
OPIATES: NOT DETECTED
Tetrahydrocannabinol: NOT DETECTED

## 2017-12-09 LAB — ETHANOL

## 2017-12-09 LAB — ACETAMINOPHEN LEVEL: Acetaminophen (Tylenol), Serum: <10 ug/mL — ABNORMAL LOW (ref 10–30)

## 2017-12-09 MED ORDER — LEVETIRACETAM 500 MG PO TABS
1000.0000 mg | ORAL_TABLET | Freq: Two times a day (BID) | ORAL | Status: DC
  Administered 2017-12-09 – 2017-12-10 (×2): 1000 mg via ORAL
  Filled 2017-12-09 (×2): qty 2

## 2017-12-09 MED ORDER — LEVETIRACETAM 500 MG PO TABS
500.0000 mg | ORAL_TABLET | Freq: Two times a day (BID) | ORAL | Status: DC
  Administered 2017-12-09 (×2): 500 mg via ORAL
  Filled 2017-12-09 (×2): qty 1
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
## 2017-12-09 NOTE — ED Notes (Signed)
Pt alert and oriented, pt denies any pain at this time. Pt denies any hi or avh. Pt stated she wants to walk into traffic. Pt noted with a sitter at  the bedside. Pt stable, will continue to monitor

## 2017-12-09 NOTE — Progress Notes (Signed)
Nurse, Latricia informed of pt disposition.  
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
## 2017-12-09 NOTE — Progress Notes (Signed)
Contacted number listed on pt bracelet.   [phone]  No answer.

## 2017-12-09 NOTE — ED Notes (Signed)
Assessment completed by Dr. Sharma Covert with use of translator. Pt has been calm and cooperative.
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
## 2017-12-09 NOTE — ED Notes (Signed)
Pt having a seizure, pt noted making a loud noise. Pt began turning her head in bed, pt noted with jerking of arms and legs. Pt also noted with staring at the ceiling during seizure. These symptoms lasted for about 45 seconds. Dr Ress walked in right after to assess patient. Electronic interpreter attempted to communicate with patient. Pt did not answer questions.  Pt alert and quietly resting in bed at this time. Will continue to monitor

## 2017-12-09 NOTE — ED Provider Notes (Signed)
Called to bedside for seizure activity. Per nursing report patient had generalized tonic clonic activity that lasted just under one minute. On arrival to the bedside patient is postictal appearing. Examination is significantly limited due to language barrier. Attempted to use the Swahili interpreter but patient did not respond to questioning through the interpreter. She is non-toxic appearing on examination. She can mimic when prompted and moves all extremities symmetrically. On record review in care everywhere she sought neurology on October 9 with recommendation to increase her Keppra to 500 mg in the morning and 1000 mg at night for one week followed by 1000 mg PO BID. It is unclear if she was taking the medications as directed or at the 500 mg BID dosing. Notes in epic mentioned that she has experienced 10 breakthrough seizures since July. Discussed with neurologist on call, will give her an additional 1 g of PO Keppra in the emergency department at this time and continue at the 1 g BID dosing. She is medically cleared for psychiatric evaluation and treatment.   Tilden Fossa, MD 12/10/17 0030

## 2017-12-09 NOTE — ED Notes (Signed)
Patient calm and cooperative eating dinner.

## 2017-12-10 DIAGNOSIS — R569 Unspecified convulsions: Secondary | ICD-10-CM

## 2017-12-10 LAB — URINALYSIS, ROUTINE W REFLEX MICROSCOPIC
Bilirubin Urine: NEGATIVE
Glucose, UA: NEGATIVE mg/dL
Hgb urine dipstick: NEGATIVE
Ketones, ur: 5 mg/dL — AB
Nitrite: NEGATIVE
PROTEIN: NEGATIVE mg/dL
Specific Gravity, Urine: 1.032 — ABNORMAL HIGH (ref 1.005–1.030)
pH: 5 (ref 5.0–8.0)

## 2017-12-10 MED ORDER — RISPERIDONE 0.5 MG PO TABS: 0.5000 mg | ORAL_TABLET | Freq: Every day | ORAL | 0 refills | Status: DC

## 2017-12-10 MED ORDER — RISPERIDONE 0.5 MG PO TABS
0.5000 mg | ORAL_TABLET | Freq: Every day | ORAL | Status: DC
  Administered 2017-12-10: 0.5 mg via ORAL
  Filled 2017-12-10: qty 1

## 2017-12-10 NOTE — Progress Notes (Addendum)
CSW received a call from pt's RN stating pt has been D/C'd and needs transport.  Per RN, pt gave verbal permission via the pt's sitter who speaks Swahili to call the pt's contact Merlinda Frederick at ph: [phone] for transport.  Pt stated she wanted to return home and that a taxi would be okay for transport via the staff-member who spoke Swahili.   CSW called pt's contact (with the pt's verbal permission obtained through a [facility] interpreter) who works for [employer] and left a HIPPA-compliant VM asking for a return call.\\8:42 PM  CSW received a call from pt's contact Merlinda Frederick at ph: [phone] who stated pt's brother's are:  [facility] at ph: [phone] and  Ebeli at ph: [phone]  CSW will continue to follow for D/C needs.    8:53 PM CSW received a phone call from pt's brother Ebeli at ph: [phone] who does not speak English but only Swahili.  CSW enlisted the assistance of a Public librarian interpreter who spoke with the pt's brother Thomasenia Sales who provided the pt's correct address:  [address] [place], [place] [address].  CSW spoke with registration who updated the pt's contact sheet with the pt's correct information (address, relatives).  Pt's correct address was given to the taxi company who took the pt home.  CSW will continue to follow for D/C needs.  Dorothe Pea. Seung Nidiffer, LCSW, LCAS, CSI Clinical Social Worker Ph: [phone]     ]

## 2017-12-10 NOTE — Consult Note (Signed)
[facility] Psych ED Discharge  12/10/2017 11:33 AM Patricia [name]  MRN:  [id number] Principal Problem: Post-ictal state [facility]) Discharge Diagnoses:  Patient Active Problem List   Diagnosis Date Noted  . Cognitive developmental delay [F81.9]     Subjective: Patricia [name] reports that she she is feeling better today since having a seizure overnight. She reports compliance with her seizure medication at home. She denies SI or HI today. She reports that she loves her mother and would like to return home. She reports that she sees people that others cannot see. These people make demeaning comments to her.    Total Time spent with patient: 30 minutes  Past Psychiatric History: None   Past Medical History:  Past Medical History:  Diagnosis Date  . Cognitive developmental delay   . Seizures (HCC)   The histories are not reviewed yet. Please review them in the "History" navigator section and refresh this SmartLink. Family History: No family history on file. Family Psychiatric  History: Denies Social History:  Social History   Substance and Sexual Activity  Alcohol Use Not Currently    Social History   Substance and Sexual Activity  Drug Use Not Currently   Social History   Socioeconomic History  . Marital status: Single    Spouse name: Not on file  . Number of children: Not on file  . Years of education: Not on file  . Highest education level: Not on file  Occupational History  . Not on file  Social Needs  . Financial resource strain: Not on file  . Food insecurity:    Worry: Not on file    Inability: Not on file  . Transportation needs:    Medical: Not on file    Non-medical: Not on file  Tobacco Use  . Smoking status: Unknown If Ever Smoked  Substance and Sexual Activity  . Alcohol use: Not Currently  . Drug use: Not Currently  . Sexual activity: Not on file  Lifestyle  . Physical activity:    Days per week: Not on file    Minutes per session: Not on file  . Stress:  Not on file  Relationships  . Social connections:    Talks on phone: Not on file    Gets together: Not on file    Attends religious service: Not on file    Active member of club or organization: Not on file    Attends meetings of clubs or organizations: Not on file    Relationship status: Not on file  Other Topics Concern  . Not on file  Social History Narrative  . Not on file    Has this patient used any form of tobacco in the last 30 days? (Cigarettes, Smokeless Tobacco, Cigars, and/or Pipes) Prescription not provided because: N/A  Current Medications: Current Facility-Administered Medications  Medication Dose Route Frequency Provider Last Rate Last Dose  . levETIRAcetam (KEPPRA) tablet 1,000 mg  1,000 mg Oral BID Tilden Fossa, MD   1,000 mg at 12/09/17 2302   Current Outpatient Medications  Medication Sig Dispense Refill  . levETIRAcetam (KEPPRA) 500 MG tablet Take 1,000 mg by mouth 2 (two) times daily.     PTA Medications:  (Not in a hospital admission)  Musculoskeletal: Strength & Muscle Tone: within normal limits Gait & Station: normal Patient leans: N/A  Psychiatric Specialty Exam: Physical Exam  Nursing note and vitals reviewed. Constitutional: She is oriented to person, place, and time. She appears well-developed and well-nourished.  HENT:  Head: Normocephalic and atraumatic.  Neck: Normal range of motion.  Respiratory: Effort normal.  Musculoskeletal: Normal range of motion.  Neurological: She is alert and oriented to person, place, and time.  Psychiatric: She has a normal mood and affect. Her speech is normal and behavior is normal. Judgment and thought content normal. Cognition and memory are normal.    Review of Systems  Psychiatric/Behavioral: Positive for hallucinations. Negative for substance abuse and suicidal ideas. The patient does not have insomnia.   All other systems reviewed and are negative.   Blood pressure (!) 103/58, pulse 71,  temperature 97.6 F (36.4 C), temperature source Oral, resp. rate 17, SpO2 100 %.There is no height or weight on file to calculate BMI.  General Appearance: Fairly Groomed, young, African female, wearing paper hospital scrubs and lying in bed. NAD.   Eye Contact:  Good  Speech:  Clear and Coherent and Normal Rate  Volume:  Normal  Mood:  Euthymic  Affect:  Congruent  Thought Process:  Goal Directed, Linear and Descriptions of Associations: Intact  Orientation:  Full (Time, Place, and Person)  Thought Content:  Logical and Hallucinations: Auditory Visual  Suicidal Thoughts:  No  Homicidal Thoughts:  No  Memory:  Immediate;   Good Recent;   Good Remote;   Good  Judgement:  Fair  Insight:  Fair  Psychomotor Activity:  Normal  Concentration:  Concentration: Good and Attention Span: Good  Recall:  Good  Fund of Knowledge:  Good  Language:  Good  Akathisia:  No  Handed:  Right  AIMS (if indicated):   N/A  Assets:  Communication Skills Desire for Improvement  ADL's:  Intact  Cognition:  WNL  Sleep:   N/A   Assessment:  Patricia [name] is a 27 y.o. female who was admitted with psychosis in the setting of seizures. She was witnessed to have a seizure overnight. She reports feeling better today. She denies SI or HI. 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 She does not appear to be responding to internal stimuli. It is unclear whether they are attributed to a postictal state versus cultural vs primary psychiatric condition. Recommend low dose antipsychotic for hallucinations. She will be scheduled follow up with [facility] for medication management.   Demographic Factors:  Adolescent or young adult and Unemployed  Loss Factors: NA  Historical Factors: NA  Risk Reduction Factors:   Sense of responsibility to family, Living with another person, especially a relative, Positive social support and Positive therapeutic relationship  Continued Clinical Symptoms:  Medical Diagnoses and  Treatments/Surgeries  Cognitive Features That Contribute To Risk:  None    Suicide Risk:  Minimal: No identifiable suicidal ideation.  Patients presenting with no risk factors but with morbid ruminations; may be classified as minimal risk based on the severity of the depressive symptoms    Plan Of Care/Follow-up recommendations:  -Start Risperdal 0.5 mg daily for psychosis.  -Continue Keppra 1000 mg BID for seizures.  -Patient will be provided resources with [facility] for follow up.  -Updated patient's case manager by phone with treatment plan.   Disposition: Discharge home.  Cherly Beach, DO 12/10/2017, 11:33 AM

## 2017-12-10 NOTE — BH Assessment (Signed)
[facility] Assessment Progress Note  Per Jacqueline Norman, DO, this pt does not require psychiatric hospitalization at this time.  Pt is to be discharged from [facility] with recommendation to follow up with [facility].  This has been included in pt's discharge instructions.  Pt's nurse has been notified.  Keller Mikels, MA Triage Specialist [phone]     

## 2017-12-10 NOTE — Discharge Instructions (Signed)
For your mental health needs, you are advised to follow up with [facility].  New and returning patients are seen at their walk-in clinic.  Walk-in hours are Monday - Friday from 8:00 am - 3:00 pm.  Walk-in patients are seen on a first come, first served basis.  Try to arrive as early as possible for he best chance of being seen the same day: ° °     [facility] °     [address] °     [place], [place] [address] °     [phone] °

## 2017-12-14 ENCOUNTER — Encounter: Payer: Self-pay | Admitting: Nurse Practitioner

## 2018-01-19 ENCOUNTER — Emergency Department (HOSPITAL_COMMUNITY)
Admission: EM | Admit: 2018-01-19 | Discharge: 2018-01-21 | Disposition: A | Discharge status: Home / Self Care | Payer: Medicaid Other | Attending: Emergency Medicine | Admitting: Emergency Medicine

## 2018-01-19 ENCOUNTER — Encounter (HOSPITAL_COMMUNITY): Payer: Self-pay

## 2018-01-19 DIAGNOSIS — F333 Major depressive disorder, recurrent, severe with psychotic symptoms: Secondary | ICD-10-CM | POA: Insufficient documentation

## 2018-01-19 DIAGNOSIS — G40909 Epilepsy, unspecified, not intractable, without status epilepticus: Secondary | ICD-10-CM | POA: Diagnosis not present

## 2018-01-19 DIAGNOSIS — Z79899 Other long term (current) drug therapy: Secondary | ICD-10-CM | POA: Insufficient documentation

## 2018-01-19 DIAGNOSIS — F329 Major depressive disorder, single episode, unspecified: Secondary | ICD-10-CM | POA: Diagnosis present

## 2018-01-19 DIAGNOSIS — G809 Cerebral palsy, unspecified: Secondary | ICD-10-CM | POA: Diagnosis not present

## 2018-01-19 DIAGNOSIS — I10 Essential (primary) hypertension: Secondary | ICD-10-CM | POA: Insufficient documentation

## 2018-01-19 DIAGNOSIS — R45851 Suicidal ideations: Secondary | ICD-10-CM | POA: Insufficient documentation

## 2018-01-19 DIAGNOSIS — F29 Unspecified psychosis not due to a substance or known physiological condition: Secondary | ICD-10-CM | POA: Diagnosis not present

## 2018-01-19 DIAGNOSIS — F4325 Adjustment disorder with mixed disturbance of emotions and conduct: Secondary | ICD-10-CM | POA: Diagnosis not present

## 2018-01-19 LAB — COMPREHENSIVE METABOLIC PANEL
ALBUMIN: 3.9 g/dL (ref 3.5–5.0)
ALT: 20 U/L (ref 0–44)
AST: 18 U/L (ref 15–41)
Alkaline Phosphatase: 59 U/L (ref 38–126)
Anion gap: 6 (ref 5–15)
BUN: 9 mg/dL (ref 6–20)
CHLORIDE: 112 mmol/L — AB (ref 98–111)
CO2: 22 mmol/L (ref 22–32)
CREATININE: 0.91 mg/dL (ref 0.44–1.00)
Calcium: 8.8 mg/dL — ABNORMAL LOW (ref 8.9–10.3)
GFR calc non Af Amer: >60 mL/min (ref >60)
GLUCOSE: 96 mg/dL (ref 70–99)
Potassium: 3.5 mmol/L (ref 3.5–5.1)
SODIUM: 140 mmol/L (ref 135–145)
Total Bilirubin: 0.5 mg/dL (ref 0.3–1.2)
Total Protein: 7.6 g/dL (ref 6.5–8.1)

## 2018-01-19 LAB — CBC
HCT: 38.5 % (ref 36.0–46.0)
HEMOGLOBIN: 11.9 g/dL — AB (ref 12.0–15.0)
MCH: 26.2 pg (ref 26.0–34.0)
MCHC: 30.9 g/dL (ref 30.0–36.0)
MCV: 84.6 fL (ref 80.0–100.0)
NRBC: 0 % (ref 0.0–0.2)
PLATELETS: 218 10*3/uL (ref 150–400)
RBC: 4.55 MIL/uL (ref 3.87–5.11)
RDW: 13.5 % (ref 11.5–15.5)
WBC: 5.7 10*3/uL (ref 4.0–10.5)

## 2018-01-19 LAB — ACETAMINOPHEN LEVEL: Acetaminophen (Tylenol), Serum: <10 ug/mL — ABNORMAL LOW (ref 10–30)

## 2018-01-19 LAB — I-STAT BETA HCG BLOOD, ED (MC, WL, AP ONLY)

## 2018-01-19 LAB — ETHANOL: Alcohol, Ethyl (B): <10 mg/dL (ref <10)

## 2018-01-19 LAB — SALICYLATE LEVEL

## 2018-01-19 MED ORDER — LEVETIRACETAM 500 MG PO TABS
500.0000 mg | ORAL_TABLET | Freq: Two times a day (BID) | ORAL | Status: DC
  Administered 2018-01-19 – 2018-01-21 (×4): 500 mg via ORAL
  Filled 2018-01-19 (×4): qty 1

## 2018-01-19 MED ORDER — ACETAMINOPHEN 325 MG PO TABS
650.0000 mg | ORAL_TABLET | Freq: Once | ORAL | Status: AC
  Administered 2018-01-19: 650 mg via ORAL
  Filled 2018-01-19: qty 2

## 2018-01-19 MED ORDER — GABAPENTIN 400 MG PO CAPS
400.0000 mg | ORAL_CAPSULE | Freq: Three times a day (TID) | ORAL | Status: DC
  Administered 2018-01-19 – 2018-01-21 (×5): 400 mg via ORAL
  Filled 2018-01-19 (×5): qty 1
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
## 2018-01-19 NOTE — ED Notes (Signed)
Pt resting eating dinner, is calm and cooperative and pleasant. The pt speaks Swahili interpretor machine at the bedside. I will continue to monitor.
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
## 2018-01-20 LAB — RAPID URINE DRUG SCREEN, HOSP PERFORMED
AMPHETAMINES: NOT DETECTED
BARBITURATES: NOT DETECTED
BENZODIAZEPINES: NOT DETECTED
Cocaine: NOT DETECTED
Opiates: NOT DETECTED
TETRAHYDROCANNABINOL: NOT DETECTED

## 2018-01-20 NOTE — ED Notes (Signed)
Pt was oriented to room and unit.  Pt is pleasant and cooperative.  Pt was re-wanded by security and directed to restroom.  Pt is in no distress.  15 minute checks and video monitoring in place.

## 2018-01-20 NOTE — ED Notes (Signed)
Patricia ConnJason Berry, NP, patient meets inpatient criteria. TTS to secure placement. RN informed of disposition.

## 2018-01-20 NOTE — ED Notes (Signed)
PSYCH Provider at bedside. 

## 2018-01-20 NOTE — ED Notes (Signed)
ATTEMPTING TO COLLECT URINE
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
## 2018-01-20 NOTE — BH Assessment (Signed)
[facility]  Per Juanetta BeetsJacqueline Norman, DO, this voluntary [facility] further observation and stabilization.  Pt's nurse, Morrie Sheldonshley, has been notified.  Doylene Canninghomas Elam Ellis, [place] Behavioral Health Coordinator [phone]

## 2018-01-20 NOTE — ED Notes (Signed)
Bed: WBH43 Expected date:  Expected time:  Means of arrival:  Comments: 

## 2018-01-21 ENCOUNTER — Encounter (HOSPITAL_COMMUNITY): Payer: Self-pay | Admitting: Emergency Medicine

## 2018-01-21 ENCOUNTER — Emergency Department (EMERGENCY_DEPARTMENT_HOSPITAL)
Admission: EM | Admit: 2018-01-21 | Discharge: 2018-01-24 | Disposition: A | Discharge status: Home / Self Care | Payer: Medicaid Other | Source: Home / Self Care | Attending: Emergency Medicine | Admitting: Emergency Medicine

## 2018-01-21 DIAGNOSIS — G809 Cerebral palsy, unspecified: Secondary | ICD-10-CM | POA: Insufficient documentation

## 2018-01-21 DIAGNOSIS — R45851 Suicidal ideations: Secondary | ICD-10-CM

## 2018-01-21 DIAGNOSIS — Z79899 Other long term (current) drug therapy: Secondary | ICD-10-CM

## 2018-01-21 DIAGNOSIS — F4325 Adjustment disorder with mixed disturbance of emotions and conduct: Secondary | ICD-10-CM | POA: Insufficient documentation

## 2018-01-21 DIAGNOSIS — F333 Major depressive disorder, recurrent, severe with psychotic symptoms: Secondary | ICD-10-CM | POA: Insufficient documentation

## 2018-01-21 DIAGNOSIS — I10 Essential (primary) hypertension: Secondary | ICD-10-CM | POA: Insufficient documentation

## 2018-01-21 MED ORDER — ZIPRASIDONE MESYLATE 20 MG IM SOLR
20.0000 mg | Freq: Once | INTRAMUSCULAR | Status: AC
  Administered 2018-01-21: 20 mg via INTRAMUSCULAR
  Filled 2018-01-21: qty 20

## 2018-01-21 MED ORDER — LEVETIRACETAM 500 MG PO TABS
500.0000 mg | ORAL_TABLET | Freq: Two times a day (BID) | ORAL | Status: DC
  Administered 2018-01-22 – 2018-01-24 (×5): 500 mg via ORAL
  Filled 2018-01-21 (×6): qty 1

## 2018-01-21 MED ORDER — STERILE WATER FOR INJECTION IJ SOLN
INTRAMUSCULAR | Status: AC
  Administered 2018-01-21: 10 mL
  Filled 2018-01-21: qty 10

## 2018-01-21 NOTE — ED Notes (Signed)
Patient sitting at nurses station near chair on floor refusing to go to room and get undress. EDP informed, pending medication.

## 2018-01-21 NOTE — ED Notes (Signed)
Pt d/c home per MD order. Swahili  Interpretor used to assist in reviewing discharge. Cab voucher provided . Pt ambulatory off unit.

## 2018-01-21 NOTE — ED Notes (Signed)
Patient in bed resting quietly. Calm, cooperative.  
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
## 2018-01-21 NOTE — ED Notes (Signed)
Psychiatry team at bedside

## 2018-01-21 NOTE — ED Triage Notes (Signed)
Patient here via [employer] and EMS for suicidal ideation and aggression. Discharged today, went home and destroyed property, broke mirrors. She states she "w[place] and would want her mother dead as well." She still somewhat aggressive and uncoperative with care team. This triage note obtained through swahili translation.

## 2018-01-21 NOTE — Progress Notes (Addendum)
12:36pm- CSW spoke with brother Benedetto Coonslibi and was informed pt should be send by taxi [place] [address], [place] [phone]. RNCM and CSW have [facility] [place] [facility] and scheduled taxi pick up at 1pm.    There are further CSW needs. CSW singing off.  CSW received call from Tom with TTS. CSW informed that pt is ready to discharge back home. CSW is attempting to speak with brother via interpretor on the phone.    Claude MangesKierra S. Pardeep Pautz, MSW, LCSW-A Emergency Department Clinical Social Worker [phone]

## 2018-01-21 NOTE — ED Notes (Signed)
Patient resting calmly in bed. Calm, cooperative.
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
## 2018-01-21 NOTE — BH Assessment (Addendum)
[facility]  Per Juanetta BeetsJacqueline Norman, DO, this pt does not require psychiatric hospitalization at this time.  [facility]  This has been included in pt's discharge instructions.  This Clinical research associatewriter has called the social work department to ask them to facilitate pt's return to the community.  Voice message was left at 11:40.  As of this writing, return call is pending.  Pt's nurse, Morrie Sheldonshley, has been notified.  Doylene Canninghomas Blaike Vickers, MA Triage Specialist [phone]   Addendum:  At 12:10 this writer reached Cristobal GoldmannKierra, LCSW, who agrees to contact pt's family.  Doylene Canninghomas Faustine Tates, [place] Behavioral Health Coordinator [phone]
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
## 2018-01-21 NOTE — Discharge Instructions (Signed)
For your mental health needs, you are advised to follow up with [facility].  New and returning patients are seen at their walk-in clinic.  Walk-in hours are Monday - Friday from 8:00 am - 3:00 pm.  Walk-in patients are seen on a first come, first served basis.  Try to arrive as early as possible for he best chance of being seen the same day: ° °     [facility] °     [address] °     [place], [place] [address] °     [phone] °

## 2018-01-21 NOTE — ED Notes (Signed)
Bath given

## 2018-01-21 NOTE — BHH Counselor (Signed)
Per Illene SilverBrook, AC, RN [facility]. TTS to seek placement.   Redmond Pullingreylese D Zaia Carre, MS, [facility], [facility] Triage Specialist [phone]

## 2018-01-21 NOTE — BH Assessment (Signed)
[facility] Assessment Progress Note  Case was staffed with Arville CareParks FNP who recommends a inpatient admission to assist with stabilization.

## 2018-01-22 ENCOUNTER — Encounter (HOSPITAL_COMMUNITY): Payer: Self-pay | Admitting: Psychiatry

## 2018-01-22 DIAGNOSIS — R45851 Suicidal ideations: Secondary | ICD-10-CM

## 2018-01-22 MED ORDER — ACETAMINOPHEN 325 MG PO TABS
650.0000 mg | ORAL_TABLET | Freq: Once | ORAL | Status: AC
  Administered 2018-01-22: 650 mg via ORAL
  Filled 2018-01-22: qty 2

## 2018-01-22 NOTE — ED Notes (Signed)
Patient resting in bed quietly, calm, cooperative. Linen changed, shower given.

## 2018-01-22 NOTE — ED Notes (Signed)
Patient in bed resting quietly, calm, cooperative.

## 2018-01-22 NOTE — BH Assessment (Addendum)
[facility]  Per Juanetta BeetsJacqueline Norman, DO, this pt requires psychiatric hospitalization.  Pt presents under IVC initiated by EDP Pricilla LovelessScott Goldston, MD.  At 14:35 this writer spoke to [place] [facility] and obtained [facility], authorization [id number] from 01/22/2018 - 01/28/2018.  Please note that authorization does not mean that pt has been accepted to the facility.  At 16:02 I called [facility] and spoke to Selena BattenKim, who accepted demographic information by telephone.  As of this writing referral information is in the process of being faxed [facility].  Final decision is pending.   Doylene Canninghomas Vassie Kugel, MA Triage Specialist [phone]   Addendum:  At 16:35 this writer called [facility] and spoke to Vonna KotykJay, who confirms receipt of referral information.  Final decision is pending.  Doylene Canninghomas Zaelyn Noack, [place] Behavioral Health Coordinator [phone]
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
## 2018-01-23 DIAGNOSIS — F4325 Adjustment disorder with mixed disturbance of emotions and conduct: Secondary | ICD-10-CM

## 2018-01-23 MED ORDER — GABAPENTIN 100 MG PO CAPS
200.0000 mg | ORAL_CAPSULE | Freq: Three times a day (TID) | ORAL | Status: DC
  Administered 2018-01-23 – 2018-01-24 (×3): 200 mg via ORAL
  Filled 2018-01-23 (×3): qty 2

## 2018-01-23 MED ORDER — GABAPENTIN 100 MG PO CAPS: 100.0000 mg | ORAL_CAPSULE | Freq: Three times a day (TID) | ORAL | Status: DC

## 2018-01-23 MED ORDER — LORAZEPAM 1 MG PO TABS
2.0000 mg | ORAL_TABLET | Freq: Once | ORAL | Status: AC
  Administered 2018-01-23: 2 mg via ORAL
  Filled 2018-01-23: qty 2

## 2018-01-23 NOTE — ED Notes (Signed)
Patient sleeping.  Neurontin held until patient awakens.  Sitter at bedside.
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
## 2018-01-24 ENCOUNTER — Emergency Department (HOSPITAL_COMMUNITY)
Admission: EM | Admit: 2018-01-24 | Discharge: 2018-01-30 | Disposition: A | Discharge status: Other Acute Inpatient Hospital | Payer: Medicaid Other | Attending: Emergency Medicine | Admitting: Emergency Medicine

## 2018-01-24 ENCOUNTER — Other Ambulatory Visit: Payer: Self-pay

## 2018-01-24 ENCOUNTER — Encounter (HOSPITAL_COMMUNITY): Payer: Self-pay | Admitting: Emergency Medicine

## 2018-01-24 DIAGNOSIS — Z046 Encounter for general psychiatric examination, requested by authority: Secondary | ICD-10-CM

## 2018-01-24 DIAGNOSIS — F29 Unspecified psychosis not due to a substance or known physiological condition: Secondary | ICD-10-CM | POA: Diagnosis not present

## 2018-01-24 HISTORY — DX: Other symptoms and signs involving emotional state: R45.89

## 2018-01-24 HISTORY — DX: Other symptoms and signs involving appearance and behavior: R46.89

## 2018-01-24 LAB — CBC WITH DIFFERENTIAL/PLATELET
Abs Immature Granulocytes: 0.01 10*3/uL (ref 0.00–0.07)
Basophils Absolute: 0 10*3/uL (ref 0.0–0.1)
Basophils Relative: 1 %
Eosinophils Absolute: 0.3 10*3/uL (ref 0.0–0.5)
Eosinophils Relative: 6 %
HCT: 42.3 % (ref 36.0–46.0)
HEMOGLOBIN: 13.3 g/dL (ref 12.0–15.0)
Immature Granulocytes: 0 %
Lymphocytes Relative: 42 %
Lymphs Abs: 2.3 10*3/uL (ref 0.7–4.0)
MCH: 26.1 pg (ref 26.0–34.0)
MCHC: 31.4 g/dL (ref 30.0–36.0)
MCV: 82.9 fL (ref 80.0–100.0)
MONO ABS: 0.5 10*3/uL (ref 0.1–1.0)
Monocytes Relative: 9 %
Neutro Abs: 2.3 10*3/uL (ref 1.7–7.7)
Neutrophils Relative %: 42 %
Platelets: 213 10*3/uL (ref 150–400)
RBC: 5.1 MIL/uL (ref 3.87–5.11)
RDW: 13.2 % (ref 11.5–15.5)
WBC: 5.5 10*3/uL (ref 4.0–10.5)
nRBC: 0 % (ref 0.0–0.2)

## 2018-01-24 LAB — COMPREHENSIVE METABOLIC PANEL
ALT: 12 U/L (ref 0–44)
AST: 15 U/L (ref 15–41)
Albumin: 3.6 g/dL (ref 3.5–5.0)
Alkaline Phosphatase: 56 U/L (ref 38–126)
Anion gap: 11 (ref 5–15)
BUN: 11 mg/dL (ref 6–20)
CO2: 19 mmol/L — ABNORMAL LOW (ref 22–32)
CREATININE: 0.85 mg/dL (ref 0.44–1.00)
Calcium: 9.4 mg/dL (ref 8.9–10.3)
Chloride: 106 mmol/L (ref 98–111)
Glucose, Bld: 90 mg/dL (ref 70–99)
Potassium: 3.4 mmol/L — ABNORMAL LOW (ref 3.5–5.1)
Sodium: 136 mmol/L (ref 135–145)
Total Bilirubin: 0.4 mg/dL (ref 0.3–1.2)
Total Protein: 7.2 g/dL (ref 6.5–8.1)

## 2018-01-24 LAB — ETHANOL: Alcohol, Ethyl (B): <10 mg/dL (ref <10)

## 2018-01-24 LAB — I-STAT BETA HCG BLOOD, ED (MC, WL, AP ONLY): I-stat hCG, quantitative: <5 m[IU]/mL (ref <5)

## 2018-01-24 LAB — ACETAMINOPHEN LEVEL: Acetaminophen (Tylenol), Serum: <10 ug/mL — ABNORMAL LOW (ref 10–30)

## 2018-01-24 LAB — SALICYLATE LEVEL: Salicylate Lvl: <7 mg/dL (ref 2.8–30.0)

## 2018-01-24 MED ORDER — GABAPENTIN 100 MG PO CAPS: 200.0000 mg | ORAL_CAPSULE | Freq: Three times a day (TID) | ORAL | 0 refills | Status: DC

## 2018-01-24 MED ORDER — STERILE WATER FOR INJECTION IJ SOLN
INTRAMUSCULAR | Status: AC
  Filled 2018-01-24: qty 10

## 2018-01-24 MED ORDER — LORAZEPAM 2 MG/ML IJ SOLN
INTRAMUSCULAR | Status: AC
  Filled 2018-01-24: qty 1

## 2018-01-24 MED ORDER — GABAPENTIN 100 MG PO CAPS
200.0000 mg | ORAL_CAPSULE | Freq: Three times a day (TID) | ORAL | Status: DC
  Administered 2018-01-25 – 2018-01-30 (×15): 200 mg via ORAL
  Filled 2018-01-24 (×15): qty 2

## 2018-01-24 MED ORDER — ACETAMINOPHEN 500 MG PO TABS
1000.0000 mg | ORAL_TABLET | Freq: Once | ORAL | Status: AC
  Administered 2018-01-24: 1000 mg via ORAL
  Filled 2018-01-24: qty 2

## 2018-01-24 MED ORDER — LORAZEPAM 2 MG/ML IJ SOLN
1.0000 mg | Freq: Once | INTRAMUSCULAR | Status: AC
  Administered 2018-01-24: 1 mg via INTRAMUSCULAR

## 2018-01-24 MED ORDER — LEVETIRACETAM 500 MG PO TABS
500.0000 mg | ORAL_TABLET | Freq: Two times a day (BID) | ORAL | Status: DC
  Administered 2018-01-25 – 2018-01-30 (×11): 500 mg via ORAL
  Filled 2018-01-24 (×11): qty 1

## 2018-01-24 MED ORDER — ZIPRASIDONE MESYLATE 20 MG IM SOLR
20.0000 mg | INTRAMUSCULAR | Status: DC | PRN
  Administered 2018-01-24 – 2018-01-30 (×5): 20 mg via INTRAMUSCULAR
  Filled 2018-01-24 (×5): qty 20

## 2018-01-24 NOTE — B[facility] Counselor (Signed)
TTS attempted to assess patient with Catha NottinghamJamison, NP, in the room and TTS on tele-psych. Patient refused to wake with multiple attempts by Catha NottinghamJamison, NP, and Nurse Becky. TTS will attempt to see patient later this date.
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
## 2018-01-24 NOTE — ED Notes (Signed)
Pt discharged to home. Left unit ambulatory accompanied by nurse tech. Left in stable condition. Cab voucher given and per NT, voucher was presented to cab driver.

## 2018-01-24 NOTE — [facility] Suicide Risk Assessment (Addendum)
Suicide Risk Assessment  Discharge Assessment   [facility]   Principal Problem: Adjustment disorder with mixed disturbance of emotions and conduct Discharge Diagnoses: Principal Problem:   Adjustment disorder with mixed disturbance of emotions and conduct   Total Time spent with patient: 30 minutes  Musculoskeletal: Strength & Muscle Tone: within normal limits Gait & Station: normal Patient leans: N/A  Psychiatric Specialty Exam: Physical Exam  Nursing note and vitals reviewed. Constitutional: She is oriented to person, place, and time. She appears well-developed and well-nourished.  HENT:  Head: Normocephalic.  Neck: Normal range of motion.  Respiratory: Effort normal.  Musculoskeletal: Normal range of motion.  Neurological: She is alert and oriented to person, place, and time.  Psychiatric: Her speech is normal and behavior is normal. Thought content normal. Her mood appears anxious. Her affect is blunt. Cognition and memory are impaired. She expresses impulsivity.    Review of Systems  Psychiatric/Behavioral: The patient is nervous/anxious.   All other systems reviewed and are negative.   Blood pressure 99/65, pulse 80, temperature 98.7 F (37.1 C), temperature source Oral, resp. rate 16, SpO2 100 %.There is no height or weight on file to calculate BMI.  General Appearance: Casual  Eye Contact:  Good  Speech:  Normal Rate  Volume:  Normal  Mood:  Anxious, mild  Affect:  Blunt  Thought Process:  Coherent and Descriptions of Associations: Intact  Orientation:  Full (Time, Place, and Person)  Thought Content:  WDL and Logical  Suicidal Thoughts:  No  Homicidal Thoughts:  No  Memory:  Immediate;   Good Recent;   Good Remote;   Good  Judgement:  Fair  Insight:  Fair  Psychomotor Activity:  Normal  Concentration:  Concentration: Good and Attention Span: Good  Recall:  Good  Fund of Knowledge:  Fair  Language:  Good  Akathisia:  No   Handed:  Right  AIMS (if indicated):     Assets:  Housing Leisure Time Physical Health Resilience Social Support  ADL's:  Intact  Cognition:  Impaired,  Mild  Sleep:      Mental Status Per Nursing Assessment::   On Admission:   suicidal ideations  Demographic Factors:  Adolescent or young adult  Loss Factors: NA  Historical Factors: Impulsivity  Risk Reduction Factors:   Sense of responsibility to family, Living with another person, especially a relative, Positive social support and Positive therapeutic relationship  Continued Clinical Symptoms:  Anxiety mild  Cognitive Features That Contribute To Risk:  None    Suicide Risk:  Minimal: No identifiable suicidal ideation.  Patients presenting with no risk factors but with morbid ruminations; may be classified as minimal risk based on the severity of the depressive symptoms    Plan Of Care/Follow-up recommendations:  Activity:  as tolerated  Diet:  heart healthy diet  LORD, JAMISON, NP 01/24/2018, 11:36 AM
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
## 2018-01-24 NOTE — Discharge Instructions (Signed)
Adjustment Disorder, Adult Adjustment disorder is a group of symptoms that can develop after a stressful life event, such as the loss of a job or serious physical illness. The symptoms can affect how you feel, think, and act. They may interfere with your relationships. Adjustment disorder increases your risk of suicide and substance abuse. If this disorder is not managed early, it can develop into a more serious condition, such as major depressive disorder or post-traumatic stress disorder. What are the causes? This condition happens when you have trouble recovering from or coping with a stressful life event. What increases the risk? You are more likely to develop this condition if:  You have had depression or anxiety.  You are being treated for a long-term (chronic) illness.  You are being treated for an illness that cannot be cured (terminal illness).  You have a family history of mental illness.  What are the signs or symptoms? Symptoms of this condition include:  Extreme trouble doing daily tasks, such as going to work.  Sadness, depression, or crying spells.  Worrying a lot.  Loss of enjoyment.  Change in appetite or weight.  Feelings of loss or hopelessness.  Thoughts of suicide.  Anxiety, worry, or nervousness.  Trouble sleeping.  Avoiding family and friends.  Fighting or vandalism.  Complaining of feeling sick without being ill.  Feeling dazed or disconnected.  Nightmares.  Trouble sleeping.  Irritability.  Reckless driving.  Poor work performance.  Ignoring bills.  Symptoms of this condition start within three months of the stressful event. They do not last more than six months, unless the stressful circumstances last longer. Normal grieving after the death of a loved one is not a symptom of this condition. How is this diagnosed? To diagnose this condition, your health care provider will ask about what has happened in your life and how it has  affected you. He or she may also ask about your medical history and your use of medicines, alcohol, and other substances. Your health care provider may do a physical exam and order lab tests or other studies. You may be referred to a mental health specialist. How is this treated? Treatment options for this condition include:  Counseling or talk therapy. Talk therapy is usually provided by mental health specialists.  Medicines. Certain medicines may help with depression, anxiety, and sleep.  Support groups. These offer emotional support, advice, and guidance. They are made up of people who have had similar experiences.  Observation and time. This is sometimes called "watchful waiting." In this treatment, health care providers monitor your health and behavior without other treatment. Adjustment disorder sometimes gets better on its own with time.  Follow these instructions at home:  Take over-the-counter and prescription medicines only as told by your health care provider.  Keep all follow-up visits as told by your health care provider. This is important. Contact a health care provider if:  Your symptoms do not improve in six months.  Your symptoms get worse. Get help right away if:  You have serious thoughts about hurting yourself or someone else. If you ever feel like you may hurt yourself or others, or have thoughts about taking your own life, get help right away. You can go to your nearest emergency department or call:  Your local emergency services (911 in the [place]  A suicide crisis helpline, such as the National Suicide Prevention Lifeline at 1-800-273-8255. This is open 24 hours a day.  Summary  Adjustment disorder is a group of   symptoms that can develop after a stressful life event, such as the loss of a job or serious physical illness. The symptoms can affect how you feel, think, and act. They may interfere with your relationships.  Symptoms of this condition start within  three months of the stressful event. They do not last more than six months, unless the stressful circumstances last longer.  Treatment may include talk therapy, medicines, participation in a support group, or observation to see if symptoms improve.  Contact your health care provider if your symptoms get worse or do not improve in six months.  If you ever feel like you may hurt yourself or others, or have thoughts about taking your own life, get help right away. This information is not intended to replace advice given to you by your health care provider. Make sure you discuss any questions you have with your health care provider. Document Released: 10/15/2005 Document Revised: 04/11/2016 Document Reviewed: 04/11/2016 Elsevier Interactive Patient Education  2018 Elsevier Inc.  

## 2018-01-24 NOTE — ED Notes (Signed)
Patient removed all of her clothing and attempting to walk into hallway. Refusing to stay in room. Security called and [employer] at bedside.

## 2018-01-24 NOTE — ED Notes (Addendum)
ALL belongings - 1 labeled belongings bag - inventoried - placed in [employer] [facility] - NO Valuables noted.
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
## 2018-01-24 NOTE — ED Notes (Signed)
1st Exam completed by Dr Rosalia Hammersay - [facility], copy sent to [facility] Records, original placed in folder for Magistrate, and all 3 sets on clipboard.
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
## 2018-01-24 NOTE — Progress Notes (Signed)
TTS spoke with Iona Hansenarey, RN who states the pt is still sleeping and unable to be assessed. Pt reportedly speaks Swahili. TTS to assess when pt is alert and able to participate.   Princess BruinsAquicha Jenevieve Kirschbaum, MSW, LCSW Therapeutic Triage Specialist  [phone]

## 2018-01-24 NOTE — ED Notes (Signed)
Pt now awake - got up from stretcher and voided in floor of room. Pt then sat back down on stretcher.
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
## 2018-01-24 NOTE — Progress Notes (Signed)
CSW received call from patient's nurse stating patient needs transportation assistance at discharge. CSW aware patient does not speak or read English and is able to provide patient with a taxi voucher assist the patient with returning home.  CSW spoke with patient's brother at [phone] through [facility] [phone]. Brother confirmed address on facesheet, is aware of patient discharge, and is agreeable to patient discharging home with taxi voucher.  CSW to provide patient's nurse with taxi voucher.    Patricia Cutterharlotte Chaya Dehaan, LCSW-A Clinical Social Worker [phone]

## 2018-01-24 NOTE — ED Notes (Addendum)
Pt arrived to [facility] via stretcher - wearing burgundy scrubs - pt noted to be sleeping - eyes closed, respirations even, unlabored. Urine cup placed on bedside table.

## 2018-01-25 LAB — URINALYSIS, ROUTINE W REFLEX MICROSCOPIC
BILIRUBIN URINE: NEGATIVE
Glucose, UA: NEGATIVE mg/dL
Hgb urine dipstick: NEGATIVE
Ketones, ur: NEGATIVE mg/dL
Nitrite: NEGATIVE
Protein, ur: NEGATIVE mg/dL
Specific Gravity, Urine: 1.032 — ABNORMAL HIGH (ref 1.005–1.030)
pH: 5 (ref 5.0–8.0)

## 2018-01-25 LAB — RAPID URINE DRUG SCREEN, HOSP PERFORMED
Amphetamines: NOT DETECTED
Barbiturates: NOT DETECTED
Benzodiazepines: POSITIVE — AB
Cocaine: NOT DETECTED
Opiates: NOT DETECTED
Tetrahydrocannabinol: NOT DETECTED

## 2018-01-25 MED ORDER — STERILE WATER FOR INJECTION IJ SOLN
INTRAMUSCULAR | Status: AC
  Administered 2018-01-25: 05:00:00
  Filled 2018-01-25: qty 10

## 2018-01-25 MED ORDER — IBUPROFEN 400 MG PO TABS: 400.0000 mg | ORAL_TABLET | Freq: Four times a day (QID) | ORAL | Status: DC | PRN

## 2018-01-25 MED ORDER — ACETAMINOPHEN 325 MG PO TABS
650.0000 mg | ORAL_TABLET | Freq: Four times a day (QID) | ORAL | Status: DC | PRN
  Administered 2018-01-25: 650 mg via ORAL
  Filled 2018-01-25: qty 2

## 2018-01-25 NOTE — ED Notes (Signed)
[facility] now [facility].  Have been unsuccessful w/ interpreter services d/t connection issues in area.  Per Ala DachFord, he is on the way over to complete TTS w/ pt.

## 2018-01-25 NOTE — Progress Notes (Signed)
Pt. meets criteria for inpatient treatment per , NP.  Referred out to the following hospitals: [facility]  [facility]  [facility]  [facility]  [facility]  [facility]  [facility]  [facility]  [facility]  [facility]       Disposition CSW will continue to follow for placement.  Timmothy EulerJean T. Kaylyn LimSutter, MSW, LCSWA Disposition Clinical Social Work [phone] ([phone] (office)
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
## 2018-01-25 NOTE — Progress Notes (Signed)
CSW spoke with RN to confirm that pt is still being recommended for inpt psych placement at this time per TTS note.TTS seeking placement at this time from CSW's knowledge.  ED CSW following in the event that pt is cleared.    Claude MangesKierra S. Donella Pascarella, MSW, LCSW-A Emergency Department Clinical Social Worker [phone]

## 2018-01-25 NOTE — Progress Notes (Signed)
Per psych NP, pt likely to be cleared for discharge tomorrow after being reassessed in the morning. CSW will follow for assistance in transitioning pt home. Per previous notes, pt requesting to go home earlier in the day.   CSW will leave handoff for daytime CSW to follow if pt's disposition is changed to discharge.   Montine CircleKelsy Lilias Lorensen, Silverio LayLCSWA [facility]  [phone]

## 2018-01-25 NOTE — ED Notes (Signed)
Pt attempting to leave unit despite being informed via interpreter line that she is IVC.  Attempts to deescalate unsuccessful.  Security to unit.  Pt fell to floor and began to undress in common area.  Assisted to room.  Geodon administered.  Interpreter on the phone during interaction.  Per interpreter by continued to say, "I want to go home."

## 2018-01-25 NOTE — Progress Notes (Signed)
Smiling on assessment and eating without issues.  Needs to be reassessed for discharge tomorrow and turned over to social work for assistance in resolving home issues.  Patricia [name], PMHNP

## 2018-01-25 NOTE — ED Provider Notes (Signed)
27 year old female here for hallucinations.  Please see previous providers note for full H&P.  Patient resting comfortably in exam bed.  She received Geodon approximately 2 hours prior to my evaluation.  Will allow patient to continue resting as she looks to be in no acute distress.  No changes to medical plan at this time.  Awaiting disposition per behavioral health.  Vitals:   01/24/18 1803 01/24/18 2156  BP: 112/72 (!) 101/57  Pulse: 61 61  Resp: 17 15  Temp: 97.9 F (36.6 C) 98.2 F (36.8 C)  SpO2: 98% 99%      Eyvonne MechanicHedges, [place], PA-C 01/25/18 [phone]    Doug SouJacubowitz, Sam, MD 01/25/18 [phone]
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
## 2018-01-25 NOTE — ED Provider Notes (Signed)
4:04 AM  D/w Ala DachFord with behavioral health.  They report patient meets inpatient criteria and they will seek placement.   Patricia [name], Layla MawKristen N, DO 01/25/18 [phone]

## 2018-01-26 ENCOUNTER — Encounter (HOSPITAL_COMMUNITY): Payer: Self-pay | Admitting: Registered Nurse

## 2018-01-26 NOTE — ED Notes (Signed)
Pt woke - ambulated to bathroom and back to room w/o difficulty. Lying on bed w/eyes closed. Respirations even, unlabored.

## 2018-01-26 NOTE — Progress Notes (Signed)
CSW aware that per NP note pt is still in need of inpt psych placement at this time. RN notified. CSW will follow for any further ED CSW needs.    Claude MangesKierra S. Aveleen Nevers, MSW, LCSW-A Emergency Department Clinical Social Worker [phone]

## 2018-01-26 NOTE — ED Notes (Signed)
Telepsych completed. Pt in shower.
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
## 2018-01-26 NOTE — BH Assessment (Signed)
BHH Assessment Progress Note   Patient was seen by TTS and Shuvon Rankin for re-assessment.  Patient continues to communicate a plan to kill herself by stabbing, she was laughing inappropriately during her assessment and she appeared to be responding to external visual hallucinations in the room as she was being assessed.  Therefore, continued inpatient treatment is recommended.  TTS and Social Work to continue to explore placement options.
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
## 2018-01-27 NOTE — Progress Notes (Addendum)
CSW called and left a HIPAA compliant vm for Patricia [name], with [employer], an immigration and refugee support organization, to assess whether this organization could facilitate patient getting an assessment for Care Coordination at [facility].  Patricia EulerJean T. Kaylyn LimSutter, MSW, LCSWA Disposition Clinical Social Work [phone] ([phone] (office)    @10 :00 Mr. [name] returned call and advised that pt's [employer] Case Worker is now a person named Patricia [name], [phone],  CSW left HIPAA compliant VM.

## 2018-01-27 NOTE — ED Notes (Signed)
TTS and interpreter bedside.

## 2018-01-27 NOTE — ED Notes (Signed)
Patient in shower 

## 2018-01-27 NOTE — Progress Notes (Signed)
Disposition CSW contacted [facility] to see if pt has a GafferCare Coordinator.  CSW spoke to Access clinician who indicated that patient did not have a Care Coordinator but was referred for Care Coordination back in September 2019.  That referral made it to the IDD Care Coordinator, Raymond GurneyJennifer Gustafson, who would have begun collecting information about patient's needs.  CSW was transferred to Ms. Gustafson's vm and left a message requesting a call back for information.  Timmothy EulerJean T. Kaylyn LimSutter, MSW, LCSWA Disposition Clinical Social Work [phone] (cell) [phone] (office)

## 2018-01-28 MED ORDER — STERILE WATER FOR INJECTION IJ SOLN
INTRAMUSCULAR | Status: AC
  Filled 2018-01-28: qty 10

## 2018-01-28 MED ORDER — LORAZEPAM 1 MG PO TABS
1.0000 mg | ORAL_TABLET | Freq: Once | ORAL | Status: AC
  Administered 2018-01-29: 1 mg via ORAL
  Filled 2018-01-28 (×2): qty 1

## 2018-01-28 NOTE — ED Notes (Signed)
RN spoke with Dr.Campos regarding soft BPs; EDP ok with pressures due to past soft pressures being patient's norm. Pt is A&Ox4 and asymptotic at this time; RN to continue to [facility],RN

## 2018-01-28 NOTE — ED Notes (Signed)
PA informed of trending low Bps, will assess q15 for 1 hr then q30

## 2018-01-28 NOTE — ED Provider Notes (Signed)
I have been asked by nursing staff to evaluate patient for yelling/crying and refusing to take p.o. medication.  Patient is a 27 year old Swahili speaking female who is on involuntarily committed for suicidal ideations, awaiting inpatient admission to psychiatric hospital.  Upon evaluation patient is lying naked on her back in the room screaming and Swahili.  Multiple security guards outside of door, consider on phone with Swahili interpreter.  Interpreter reports that patient is asking to be killed "just like her mother ".  Patient is inconsolable and will not answer any other questions, repeating the same phrase and Swahili over and over again.  Patient with as needed order for Geodon, has been given by nursing staff. ----------------------------- Review of previous work-up Beta-hCG negative Ethanol negative Tylenol negative Salicylate negative CMP nonacute CBC within normal limits UDS positive for benzodiazepines Urinalysis nonacute, likely contaminated multiple squames present, nitrite negative. Vital signs appear stable, blood pressure appears to run soft. Physical Exam  BP (!) 102/56 (BP Location: Left Arm)   Pulse 68   Temp 98.1 F (36.7 C) (Oral)   Resp 20   SpO2 99%   Physical Exam  Constitutional: She appears well-developed and well-nourished. She appears distressed (Yelling and crying).  HENT:  Head: Normocephalic and atraumatic.  Right Ear: External ear normal.  Left Ear: External ear normal.  Nose: Nose normal.  Eyes: Pupils are equal, round, and reactive to light. EOM are normal.  Neck: Trachea normal and normal range of motion. No tracheal deviation present.  Pulmonary/Chest: Effort normal. No respiratory distress.  Musculoskeletal: Normal range of motion.  Moving all extremities without difficulty.  Neurological: She is alert.  Skin: Skin is warm and dry.  Psychiatric: She has a normal mood and affect. Her behavior is normal.   ED Course/Procedures      Procedures  MDM  Patient given her as needed Geodon ordered by nursing staff.  Reevaluated approximately 15 minutes after Geodon was given, patient is now calm and cooperative laying in bed still muttering to herself.  No longer combative.   Note: Portions of this report may have been transcribed using voice recognition software. Every effort was made to ensure accuracy; however, inadvertent computerized transcription errors may still be present.   Elizabeth PalauMorelli, Daphyne Miguez A, PA-C 01/28/18 1630    Raeford RazorKohut, Stephen, MD 01/29/18 1544

## 2018-01-28 NOTE — ED Notes (Signed)
Pt sitting on ground, refusing via interpreter to get up and go back in room stating, "I don't want to be here."  Pt was non-disruptive, then abruptly stood up and attempted to leave. Has been informed by interpreter today that she is involuntary and unable to leave. Attempted to radio security at this time, battery dead. Pt then removed bottom half of clothing and sat on ground outside of room. Refused via interpreter, Dr Juleen ChinaKohut informed. PO ativan ordered, offered to pt and explained via interpreter that it was to prevent a seizure and it would help pt calm down. Pt states "I do not want to take medication." Removed top of scrubs.  Refused to get dressed, began screaming, "I don't want to be here!" via interpreter. Carried to bed by security, immediately got back out of bed and began screaming on floor, "I don't want to be here, I want to die!" Began crawling under bed and reaching for cord. PRN Geodon administered with verbal permission from PA. Replaced in bed, continued screaming, "I just want to die!" "Take me back to [place]!"  Began kicking held by security to prevent injury to staff but no restraints required and released once kicking subsided. RN and sitter with interpreter at bedside throughout, monitored until pt calmed down and went to sleep. Stated via interpreter she had a seizure, but no seizure like activity noted and pt was responsive and answering questions immediately before episode

## 2018-01-28 NOTE — Progress Notes (Signed)
CSW received vm from Care Coordinator, Raymond GurneyJennifer Gustafson at [facility] [phone]).  Left a return vm and will await call back for collaboration on pt's care.  Timmothy EulerJean T. Kaylyn LimSutter, MSW, LCSWA Disposition Clinical Social Work [phone] (cell) [phone] (office)
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
## 2018-01-28 NOTE — ED Notes (Signed)
BREAKFAST TRAY ORDERED 

## 2018-01-29 MED ORDER — STERILE WATER FOR INJECTION IJ SOLN
INTRAMUSCULAR | Status: AC
  Administered 2018-01-29: 10 mL
  Filled 2018-01-29: qty 10

## 2018-01-29 NOTE — ED Notes (Signed)
ORDERED A REG BREAKFAST NO SHARPS--Patricia [name]  

## 2018-01-29 NOTE — ED Provider Notes (Signed)
Morning round on psychiatric holding.  Nurse reports patient has been calm and cooperative, sleeping comfortably, no acute distress.  No outbursts since yesterday.  Patient's blood pressures lower since time of arrival, unchanged, this appears to be patient's baseline.  Blood pressure from 8 days ago similar, 90/60.  No intervention necessary at this time.  RN to continue monitoring patient's blood pressure. Physical Exam  BP (!) 89/55 (BP Location: Left Arm)   Pulse 75   Temp 98.1 F (36.7 C) (Oral)   Resp 16   SpO2 100%   Physical Exam  Constitutional: She appears well-developed and well-nourished. No distress.  HENT:  Head: Normocephalic and atraumatic.  Right Ear: External ear normal.  Left Ear: External ear normal.  Nose: Nose normal.  Neck: Trachea normal. No tracheal deviation present.  Pulmonary/Chest: Effort normal. No respiratory distress.  Skin: Skin is warm and dry.  Patient sleeping comfortably, no acute distress, respiratory rate regular, no respiratory distress.  Patient allowed to continue sleeping.  ED Course/Procedures     Procedures  MDM  Patient at 114 hours into her psychiatric hold.  Labs reviewed and are unremarkable.  Urinalysis likely contaminated, large amount of squamous epithelium cells present, nitrite negative.  Vital signs stable, blood pressure has been running on the low side, likely patient's baseline.  Plan at this time is to continue holding, awaiting psychiatric placement.  RN to inform provider regarding patient's medical needs.   Note: Portions of this report may have been transcribed using voice recognition software. Every effort was made to ensure accuracy; however, inadvertent computerized transcription errors may still be present.   Bill SalinasMorelli, Tania Steinhauser A, PA-C 01/29/18 [phone]    Doug SouJacubowitz, Sam, MD 01/29/18 [phone]
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
## 2018-01-29 NOTE — ED Notes (Signed)
Dinner tray delivered. Pt ate 100%

## 2018-01-29 NOTE — Progress Notes (Signed)
CSW spoke with clinician, Jake SharkHarold, at [facility] who shared that there is a denial letter for I/DD care coordination services for pt (service requested by [facility]) as there is no documentation of any testing noting that pt is I/DD. He suggested CSW contact [facility] to assess if pt was on their "Do not admit" list prior to completing a Diversion Exception. CSW spoke with [facility]'s admissions charge RN, Armoni, who stated that [facility] does not have a "Do not admit" list but if there was any diagnosis in medical notes from clinician's or doctors noting an I/DD diagnosis, regardless if there has been psychological testing or not, a Diversion Exception would required by [facility] to be approved. [facility] clinical staff have reported that without I/DD diagnostic testing showing cognitive limitations, such requests would be denied.   CSW followed up with the following hospitals regarding referrals:   [facility]: "Still reviewing".    Referral information also sent to the following hospitals: [facility]  Wells GuilesSarah Kathey Simer, [place], [place] Disposition CSW [facility] [phone]

## 2018-01-29 NOTE — ED Notes (Signed)
Patient ambulatory to bathroom with steady gait at this time 

## 2018-01-30 MED ORDER — STERILE WATER FOR INJECTION IJ SOLN
INTRAMUSCULAR | Status: AC
  Administered 2018-01-30: 1.2 mL
  Filled 2018-01-30: qty 10

## 2018-01-30 NOTE — ED Provider Notes (Signed)
10:35 AM Patient accepted to [facility]. Dr. Loyola Mastornwall is accepting physician. On repeat exam the patient is standing up, in no distress, is aware of transfer.    Gerhard MunchLockwood, Laporche Martelle, MD 01/30/18 [phone]

## 2018-01-30 NOTE — ED Notes (Addendum)
Pt becoming increasingly agitated. Contacted the translator service on the [employer] and attempted to talk the patient into a calmer state with the help of a translator. Process was not working with the translator and the patient became increasingly agitated. Patient walked out into the hallway and staged a "sit in" in-front of the exit door. Patient would not cooperate and return to her room. Patient was instructed, through translation, to return to her room or she would be given medications to calm and relax her. Patient would not comply and patient was assisted off the floor and into her room. Once in her room, patient still would not comply with staff. Patient became increasingly agitated and attempted to lunge at staff. Geodon was administered at 0434 as a result of the increased agitation.

## 2018-01-30 NOTE — ED Notes (Addendum)
Pt is now noted to be awake and sitting on the side of the bed, also noted to be talking but to no one in particular. Unable to discern what pt is saying due to language barrier.

## 2018-01-30 NOTE — ED Notes (Signed)
ALL belongings - 1 labeled belongings bag - NO Valuables noted - Deputy - Pt aware.
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
## 2018-01-30 NOTE — ED Notes (Addendum)
Pt now awake - spoke w/pt via interpretor - Swaheli. Advised pt and her brother, Janna Archbili, [phone] - of her acceptance and being transported [facility]. Both voiced agreement w/tx plan. Pt eating breakfast.

## 2018-01-30 NOTE — ED Notes (Signed)
Breakfast tray ordered 

## 2018-02-02 ENCOUNTER — Ambulatory Visit: Payer: Medicaid Other | Admitting: Family Medicine

## 2018-02-22 ENCOUNTER — Emergency Department (HOSPITAL_COMMUNITY)
Admission: EM | Admit: 2018-02-22 | Discharge: 2018-02-26 | Disposition: A | Discharge status: Home / Self Care | Payer: Medicaid Other | Attending: Emergency Medicine | Admitting: Emergency Medicine

## 2018-02-22 ENCOUNTER — Encounter (HOSPITAL_COMMUNITY): Payer: Self-pay

## 2018-02-22 DIAGNOSIS — Y929 Unspecified place or not applicable: Secondary | ICD-10-CM | POA: Diagnosis not present

## 2018-02-22 DIAGNOSIS — F329 Major depressive disorder, single episode, unspecified: Secondary | ICD-10-CM | POA: Diagnosis not present

## 2018-02-22 DIAGNOSIS — X58XXXA Exposure to other specified factors, initial encounter: Secondary | ICD-10-CM | POA: Diagnosis not present

## 2018-02-22 DIAGNOSIS — R45851 Suicidal ideations: Secondary | ICD-10-CM

## 2018-02-22 DIAGNOSIS — R4689 Other symptoms and signs involving appearance and behavior: Secondary | ICD-10-CM | POA: Diagnosis not present

## 2018-02-22 DIAGNOSIS — T1491XA Suicide attempt, initial encounter: Secondary | ICD-10-CM | POA: Insufficient documentation

## 2018-02-22 DIAGNOSIS — Y999 Unspecified external cause status: Secondary | ICD-10-CM | POA: Diagnosis not present

## 2018-02-22 DIAGNOSIS — Y939 Activity, unspecified: Secondary | ICD-10-CM | POA: Insufficient documentation

## 2018-02-22 LAB — ETHANOL: Alcohol, Ethyl (B): <10 mg/dL (ref <10)

## 2018-02-22 LAB — COMPREHENSIVE METABOLIC PANEL
ALT: 14 U/L (ref 0–44)
AST: 18 U/L (ref 15–41)
Albumin: 4 g/dL (ref 3.5–5.0)
Alkaline Phosphatase: 44 U/L (ref 38–126)
Anion gap: 11 (ref 5–15)
BUN: 18 mg/dL (ref 6–20)
CO2: 17 mmol/L — ABNORMAL LOW (ref 22–32)
Calcium: 9.5 mg/dL (ref 8.9–10.3)
Chloride: 109 mmol/L (ref 98–111)
Creatinine, Ser: 1.05 mg/dL — ABNORMAL HIGH (ref 0.44–1.00)
GFR calc Af Amer: >60 mL/min (ref >60)
GFR calc non Af Amer: >60 mL/min (ref >60)
Glucose, Bld: 91 mg/dL (ref 70–99)
Potassium: 3.7 mmol/L (ref 3.5–5.1)
Sodium: 137 mmol/L (ref 135–145)
Total Bilirubin: 0.5 mg/dL (ref 0.3–1.2)
Total Protein: 7.6 g/dL (ref 6.5–8.1)

## 2018-02-22 LAB — CBC WITH DIFFERENTIAL/PLATELET
Abs Immature Granulocytes: 0.02 10*3/uL (ref 0.00–0.07)
Basophils Absolute: 0 10*3/uL (ref 0.0–0.1)
Basophils Relative: 1 %
EOS PCT: 7 %
Eosinophils Absolute: 0.5 10*3/uL (ref 0.0–0.5)
HCT: 37.2 % (ref 36.0–46.0)
Hemoglobin: 11.6 g/dL — ABNORMAL LOW (ref 12.0–15.0)
Immature Granulocytes: 0 %
LYMPHS PCT: 46 %
Lymphs Abs: 3 10*3/uL (ref 0.7–4.0)
MCH: 25.3 pg — ABNORMAL LOW (ref 26.0–34.0)
MCHC: 31.2 g/dL (ref 30.0–36.0)
MCV: 81 fL (ref 80.0–100.0)
Monocytes Absolute: 0.6 10*3/uL (ref 0.1–1.0)
Monocytes Relative: 8 %
Neutro Abs: 2.5 10*3/uL (ref 1.7–7.7)
Neutrophils Relative %: 38 %
Platelets: 247 10*3/uL (ref 150–400)
RBC: 4.59 MIL/uL (ref 3.87–5.11)
RDW: 13.7 % (ref 11.5–15.5)
WBC: 6.7 10*3/uL (ref 4.0–10.5)
nRBC: 0 % (ref 0.0–0.2)

## 2018-02-22 LAB — HCG, QUANTITATIVE, PREGNANCY: hCG, Beta Chain, Quant, S: <1 m[IU]/mL (ref <5)

## 2018-02-22 MED ORDER — ZOLPIDEM TARTRATE 5 MG PO TABS
5.0000 mg | ORAL_TABLET | Freq: Every evening | ORAL | Status: DC | PRN
  Administered 2018-02-24: 5 mg via ORAL
  Filled 2018-02-22: qty 1

## 2018-02-22 MED ORDER — ACETAMINOPHEN 325 MG PO TABS: 650.0000 mg | ORAL_TABLET | ORAL | Status: DC | PRN

## 2018-02-22 MED ORDER — ZIPRASIDONE HCL 20 MG PO CAPS
40.0000 mg | ORAL_CAPSULE | Freq: Two times a day (BID) | ORAL | Status: DC
  Administered 2018-02-22 – 2018-02-26 (×8): 40 mg via ORAL
  Filled 2018-02-22 (×2): qty 2
  Filled 2018-02-22: qty 1
  Filled 2018-02-22 (×5): qty 2
  Filled 2018-02-22: qty 1
  Filled 2018-02-22: qty 2

## 2018-02-22 NOTE — ED Notes (Signed)
Nira ConnJason Berry, NP, patient meets inpatient criteria. TTS to secure placement. Cricket, RN, informed of disposition.
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
## 2018-02-22 NOTE — ED Notes (Signed)
istat not collected, add on order sent to main lab for blood already collected

## 2018-02-22 NOTE — ED Notes (Signed)
[facility] called to report pt meets inpatient criteria and they will be seeking placement
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
## 2018-02-22 NOTE — ED Notes (Signed)
The pt is alert and oriented to situation and place. She refused to answer any other questions.
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
## 2018-02-22 NOTE — ED Provider Notes (Signed)
[facility] Provider Note   CSN: [id number] Arrival date & time: 02/22/18  1745     History   Chief Complaint Chief Complaint  Patient presents with  . Suicidal    HPI Storey Dionisio Davidli Rapozo is a 27 y.o. female. Level 5 caveat secondary to patient's mental illness and lack of cooperation with exam Patient also speaks only Swahili and some history is obtained through Swahili interpreter HPI  27 year old female history of suicidal behavior [place] [facility]  They were called as the patient was running out into whenever which is very busy traffic.  She has stated that she wishes to die.  She has had multiple similar presentations in the past.  Past Medical History:  Diagnosis Date  . Cerebral palsy (HCC)   . Hypertension   . Seizures (HCC)   . Suicidal behavior     Patient Active Problem List   Diagnosis Date Noted  . Post-ictal state (HCC)   . Post-ictal confusion 08/16/2017  . 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 Refugee health examination 07/16/2017  . Language barrier 07/16/2017  . Seizure disorder (HCC) 07/16/2017  . Hepatitis B 07/16/2017  . 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   OB History   No obstetric history on file.      Home Medications    Prior to Admission medications   Medication Sig Start Date End Date Taking? Authorizing Provider  gabapentin (NEURONTIN) 100 MG capsule Take 2 capsules (200 mg total) by mouth 3 (three) times daily. 01/24/18   Charm RingsLord, Jamison Y, NP  levETIRAcetam (KEPPRA) 500 MG tablet Take 1 tablet (500 mg total) by mouth 2 (two) times daily. 12/02/17   Nilda RiggsMartin, Nancy Carolyn, NP    Family History Family History  Problem Relation Age of Onset  . Hepatitis Brother     Social History Social History   Tobacco Use  . Smoking status: Never Smoker  . Smokeless tobacco: Never Used  Substance Use  Topics  . Alcohol use: Not Currently  . Drug use: Not Currently     Allergies   Patient has no known allergies.   Review of Systems Review of Systems  Unable to perform ROS: Psychiatric disorder     Physical Exam Updated Vital Signs BP 97/86   Pulse 70   SpO2 100%   Physical Exam Vitals signs and nursing note reviewed.  Constitutional:      Appearance: She is well-developed.     Comments: Unkempt  HENT:     Head: Normocephalic and atraumatic.     Right Ear: External ear normal.     Left Ear: External ear normal.     Nose: Nose normal.  Eyes:     Conjunctiva/sclera: Conjunctivae normal.     Pupils: Pupils are equal, round, and reactive to light.  Neck:     Musculoskeletal: Normal range of motion and neck supple.  Cardiovascular:     Rate and Rhythm: Normal rate and regular rhythm.     Heart sounds: Normal heart sounds.  Pulmonary:     Effort: Pulmonary effort is normal.     Breath sounds: Normal breath sounds.  Abdominal:     General: Bowel sounds are normal.     Palpations: Abdomen is soft.  Musculoskeletal: Normal range of motion.  Skin:    General: Skin is warm and dry.  Neurological:     Mental Status: She is  alert.     Deep Tendon Reflexes: Reflexes are normal and symmetric.  Psychiatric:        Mood and Affect: Mood is depressed. Affect is inappropriate.        Behavior: Behavior normal.        Thought Content: Thought content includes suicidal ideation.        Judgment: Judgment is impulsive and inappropriate.      ED Treatments / Results  Labs (all labs ordered are listed, but only abnormal results are displayed) Labs Reviewed  COMPREHENSIVE METABOLIC PANEL - Abnormal; Notable for the following components:      Result Value   CO2 17 (*)    Creatinine, Ser 1.05 (*)    All other components within normal limits  CBC WITH DIFFERENTIAL/PLATELET - Abnormal; Notable for the following components:   Hemoglobin 11.6 (*)    MCH 25.3 (*)    All  other components within normal limits  ETHANOL  HCG, QUANTITATIVE, PREGNANCY  RAPID URINE DRUG SCREEN, HOSP PERFORMED    EKG None  Radiology No results found.  Procedures Procedures (including critical care time)  Medications Ordered in ED Medications  acetaminophen (TYLENOL) tablet 650 mg (has no administration in time range)  zolpidem (AMBIEN) tablet 5 mg (has no administration in time range)  ziprasidone (GEODON) capsule 40 mg (40 mg Oral Given 02/22/18 1907)     Initial Impression / Assessment and Plan / ED Course  I have reviewed the triage vital signs and the nursing notes.  Pertinent labs & imaging results that were available during my care of the patient were reviewed by me and considered in my medical decision making (see chart for details).    27 year old female presents today with suicide attempt by trying to run into traffic.  She has spoken of wanting to kill herself through an interpreter.  She has had multiple similar presentations in the past.  Patient appears medically stable for gastric evaluation. TTS is attempting to secure placement for inpatient  Final Clinical Impressions(s) / ED Diagnoses   Final diagnoses:  Suicidal ideation  Suicidal behavior with attempted self-injury [facility](HCC)    ED Discharge Orders    None       Margarita Grizzleay, Shuntia Exton, MD 02/22/18 2317

## 2018-02-22 NOTE — ED Notes (Signed)
This RN advised in report pt will be moved to [facility] per MD, labs not resulted and in the process of IVC no sitter assigned at this point. Charge RN aware and agreeable to MD plan however she will call staffing to find out if pt will get sitter at 7pm.

## 2018-02-22 NOTE — ED Notes (Signed)
EKG for med clearance delayed and completed in purple zone

## 2018-02-22 NOTE — ED Notes (Signed)
[employer] here to serve patient IVC papers

## 2018-02-22 NOTE — ED Notes (Signed)
Pt given Geodon prior to handoff

## 2018-02-23 ENCOUNTER — Other Ambulatory Visit: Payer: Self-pay

## 2018-02-23 LAB — RAPID URINE DRUG SCREEN, HOSP PERFORMED
AMPHETAMINES: NOT DETECTED
Barbiturates: NOT DETECTED
Benzodiazepines: NOT DETECTED
Cocaine: NOT DETECTED
Opiates: NOT DETECTED
Tetrahydrocannabinol: NOT DETECTED

## 2018-02-23 MED ORDER — GABAPENTIN 100 MG PO CAPS
200.0000 mg | ORAL_CAPSULE | Freq: Three times a day (TID) | ORAL | Status: DC
  Administered 2018-02-23 – 2018-02-26 (×9): 200 mg via ORAL
  Filled 2018-02-23 (×10): qty 2

## 2018-02-23 MED ORDER — LEVETIRACETAM 500 MG PO TABS
500.0000 mg | ORAL_TABLET | Freq: Two times a day (BID) | ORAL | Status: DC
  Administered 2018-02-23 – 2018-02-26 (×7): 500 mg via ORAL
  Filled 2018-02-23 (×8): qty 1

## 2018-02-23 NOTE — Progress Notes (Signed)
Pt meets inpatient criteria per Nira ConnJason Berry, NP. Referral information has been sent to the following hospitals for review: [facility]  [facility]  [facility]  [facility]  [facility]   Disposition will continue to assist with inpatient placement needs.   Wells GuilesSarah Ellianne Gowen, LCSW, LCAS Disposition CSW [facility] [phone]

## 2018-02-23 NOTE — ED Notes (Signed)
Pt eating

## 2018-02-23 NOTE — ED Notes (Addendum)
Urine specimen cup placed at bedside. Will obtain VS's when pt awakens.

## 2018-02-23 NOTE — BH Assessment (Signed)
Through an interpreter, pt states she is doing good. After answering this question, pt fell back asleep and was unable to be awoken. Despite numerous tries, pt was unable to be aroused. Re-assessment will be attempted at a later time.

## 2018-02-23 NOTE — ED Notes (Signed)
Regular Diet was ordered for Lunch at 1030am. 

## 2018-02-23 NOTE — ED Notes (Signed)
Regular Diet was ordered for Breakfast. 

## 2018-02-23 NOTE — ED Notes (Signed)
TTS attempted to reassess patient; pt arousable but would not stay awake long enough to participate in assessment; TTS to try later when patient is awake-Monique,RN

## 2018-02-23 NOTE — ED Notes (Signed)
Pt now awake - dinner tray delivered to pt.

## 2018-02-24 NOTE — ED Notes (Signed)
Regular Diet was ordered for Lunch. 

## 2018-02-24 NOTE — ED Provider Notes (Signed)
28 year old female here with suicidal behavior.  Please see previous providers note for full H&P.  No changes overnight, patient resting comfortably in exam bed.  Awaiting behavioral health disposition.  Vitals:   02/23/18 1743 02/23/18 2125  BP: (!) 94/51 (!) 86/50  Pulse: 80 84  Resp: 15 18  Temp: 98.4 F (36.9 C) 98.3 F (36.8 C)  SpO2: 98% 97%      Eyvonne Mechanic, PA-C 02/24/18 8280    Mancel Bale, MD 02/24/18 [phone]

## 2018-02-24 NOTE — ED Notes (Signed)
Meal tray arrived

## 2018-02-24 NOTE — ED Notes (Signed)
Pt taking shower.  

## 2018-02-25 NOTE — ED Notes (Signed)
Patient ate all her dinner, patient now resting.

## 2018-02-25 NOTE — ED Notes (Signed)
Patient taking a shower at this time.  

## 2018-02-25 NOTE — BH Assessment (Signed)
Reassessment- Interpreter Used   Pt reports She ate and slept well. Pt reports she still has thoughts of running into traffic. Pt denies HI and AH/VH. Pt continues to meet inpatient criteria.

## 2018-02-25 NOTE — ED Notes (Signed)
Patient watching TV, calm and cooperative with staff. No needs at this time.

## 2018-02-25 NOTE — Progress Notes (Signed)
Pt continues to meet inpatient criteria. Referral information has been re-faxed to the following hospitals for review: [facility]  [facility]  [facility]   Disposition will continue to assist with placement needs.   Wells Guiles, LCSW, LCAS Disposition CSW [facility]/TTS [phone]

## 2018-02-25 NOTE — ED Notes (Signed)
Dinner tray ordered for patient.

## 2018-02-25 NOTE — ED Notes (Signed)
Patient ambulatory to bathroom with steady gait at this time 

## 2018-02-25 NOTE — ED Notes (Signed)
Patient given meal tray.
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
## 2018-02-26 NOTE — ED Notes (Addendum)
Patient verbalizes understanding of discharge instructions via Swahili interpreter. Opportunity for questioning and answers were provided. Armband removed by staff, pt discharged from ED. IVC rescind paperwork faxed to [employer]. Ambulated out into lobby, given taxi voucher

## 2018-02-26 NOTE — ED Provider Notes (Signed)
I was informed by behavioral health that patient is cleared for discharge.  I updated patient and she is comfortable with the plan.   Terrilee Files, MD 02/26/18 1110

## 2018-02-26 NOTE — ED Notes (Signed)
Breakfast Tray ordered  

## 2018-03-30 NOTE — Progress Notes (Deleted)
[facility]  PATIENT: Patricia [name] DOB: 01/03/1991   REASON FOR VISIT: Follow-up for seizure disorder HISTORY FROM: Interpreter brother and case worker    HISTORY OF PRESENT ILLNESS:UPDATE 10/9/2019CM Patricia [name], 28 year old female returns for follow-up with a history of seizure disorder since the age of 28.  She speaks Swahili and has an Equities traderinterpreter.  She also has a history of being cognitively slow.  Her seizure events are associated with dystonic posturing of the fingers in the hands and then fall with a generalized seizure event.  The seizure usually resolves in 2 to 3 minutes.  CT of the head performed 08/16/2017 was normal without acute intracranial process.  EEG performed 09/08/2017 This is an abnormal EEG recording secondary to right hemispheric slowing. This study suggests a right brain abnormality with a lowered seizure threshold. No electrographic seizures were recorded however.  The patient's brother states today that she has had approximately 10 seizures since her last visit here with Dr. Anne HahnWillis.  Her last one occurred last Thursday.  They have not called into the office to let us know that she was continuing with seizure events.  She returns for reevaluation    08/13/17 KWMs. [name] is a 28 year old left-handed African female with a history of seizures since age 28.  The patient comes in with her brother and an interpreter today.  The seizures were apparently of spontaneous onset, there is no history of head trauma.  There is no family history of seizures.  The patient has been noted to be somewhat cognitively slow throughout her life.  The patient is having about one seizure event a month.  The episodes are associated with dystonic posturing of the fingers of the hands, the patient would then begin to spin around and then she may fall down with a generalized seizure event.  The patient may resolve the seizure within 2 or 3 minutes, it may take her another  minute or 2 to start speaking more normally without confusion.  The patient reports dizziness prior to the onset of the seizure.  The patient has no focal numbness or weakness of the face, arms, legs.  She has been on gabapentin taking 400 mg 3 times daily but this has not been effective and fully controlling the seizures.  She apparently has never had a scanning procedure such as CT or MRI of the brain, she has never had an EEG study.  She has 4 brothers and 6 sisters, one brother has hepatitis B, otherwise the siblings are in good health.  The patient comes into the office today for an evaluation.  REVIEW OF SYSTEMS: Full 14 system review of systems performed and notable only for those listed, all others are neg:  Constitutional: Fatigue Cardiovascular: neg Ear/Nose/Throat: neg  Skin: neg Eyes: neg Respiratory: neg Gastroitestinal: neg  Hematology/Lymphatic: neg  Endocrine: neg Musculoskeletal:neg Allergy/Immunology: neg Neurological: Seizure, dizziness Psychiatric: neg Sleep : neg   ALLERGIES: No Known Allergies  HOME MEDICATIONS: No outpatient medications prior to visit.   No facility-administered medications prior to visit.     PAST MEDICAL HISTORY: Past Medical History:  Diagnosis Date  . Cerebral palsy (HCC)   . Hypertension   . Seizures (HCC)   . Suicidal behavior     PAST SURGICAL HISTORY: No past surgical history on file.  FAMILY HISTORY: Family History  Problem Relation Age of Onset  . Hepatitis Brother     SOCIAL HISTORY: Social History   Socioeconomic History  . Marital status: Single  Spouse name: Not on file  . Number of children: 1  . Years of education: Not on file  . Highest education level: Not on file  Occupational History    Comment: none  Social Needs  . Financial resource strain: Not on file  . Food insecurity:    Worry: Not on file    Inability: Not on file  . Transportation needs:    Medical: Not on file    Non-medical: Not on  file  Tobacco Use  . Smoking status: Never Smoker  . Smokeless tobacco: Never Used  Substance and Sexual Activity  . Alcohol use: Not Currently  . Drug use: Not Currently  . Sexual activity: Not on file  Lifestyle  . Physical activity:    Days per week: Not on file    Minutes per session: Not on file  . Stress: Not on file  Relationships  . Social connections:    Talks on phone: Not on file    Gets together: Not on file    Attends religious service: Not on file    Active member of club or organization: Not on file    Attends meetings of clubs or organizations: Not on file    Relationship status: Not on file  . Intimate partner violence:    Fear of current or ex partner: Not on file    Emotionally abused: Not on file    Physically abused: Not on file    Forced sexual activity: Not on file  Other Topics Concern  . Not on file  Social History Narrative   ** Merged History Encounter **       Lives with mother, family     PHYSICAL EXAM  There were no vitals filed for this visit. There is no height or weight on file to calculate BMI.  Generalized: Well developed, in no acute distress  Skin no rash or edema Neurological examination   Mentation: Alert and cooperative she has difficulty following verbal commands.  She does not speak English  Cranial nerve II-XII: Pupils were equal round reactive to light extraocular movements were full, visual field were full ,when counting fingers, the patient consistently misses the fingers on the right side with double simultaneous stimulation. Facial sensation and strength were normal. hearing was intact to finger rubbing bilaterally. Uvula tongue midline. head turning and shoulder shrug were normal and symmetric.Tongue protrusion into cheek strength was normal. Motor: normal bulk and tone, full strength in the BUE, BLE,  Sensory: normal and symmetric to light touch, pinprick, and  Vibration, in upper and lower extremities Coordination:  finger-nose-finger, heel-to-shin bilaterally, no dysmetria Reflexes: Brachioradialis 2/2, biceps 2/2, triceps 2/2, patellar 2/2, Achilles 2/2, plantar responses were flexor bilaterally. Gait and Station: Rising up from seated position without assistance, normal stance,  moderate stride, good arm swing, smooth turning, able to perform tiptoe, and heel walking without difficulty. Tandem gait is steady  DIAGNOSTIC DATA (LABS, IMAGING, TESTING) - I reviewed patient records, labs, notes, testing and imaging myself where available.  Lab Results  Component Value Date   WBC 6.7 02/22/2018   HGB 11.6 (L) 02/22/2018   HCT 37.2 02/22/2018   MCV 81.0 02/22/2018   PLT 247 02/22/2018      Component Value Date/Time   NA 137 02/22/2018 1849   NA 140 08/04/2017 1111   K 3.7 02/22/2018 1849   CL 109 02/22/2018 1849   CO2 17 (L) 02/22/2018 1849   GLUCOSE 91 02/22/2018 1849   BUN 18  02/22/2018 1849   BUN 8 08/04/2017 1111   CREATININE 1.05 (H) 02/22/2018 1849   CALCIUM 9.5 02/22/2018 1849   PROT 7.6 02/22/2018 1849   PROT 7.4 08/04/2017 1111   ALBUMIN 4.0 02/22/2018 1849   ALBUMIN 4.1 08/04/2017 1111   AST 18 02/22/2018 1849   ALT 14 02/22/2018 1849   ALKPHOS 44 02/22/2018 1849   BILITOT 0.5 02/22/2018 1849   BILITOT <0.2 08/04/2017 1111   GFRNONAA >60 02/22/2018 1849   GFRAA >60 02/22/2018 1849    Lab Results  Component Value Date   TSH 1.780 08/04/2017      ASSESSMENT AND PLAN  28 y.o. year old female  has a past medical history of Cerebral palsy (HCC), Hypertension, Seizures (HCC), and Suicidal behavior. 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  Patient was placed on Keppra when last seen by Dr. Anne HahnWillis 500 mg twice daily.  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  CT of the head was normal on 08/16/17 EEG  performed 09/08/2017 This is an abnormal EEG recording secondary to right hemispheric slowing. This study suggests a right brain abnormality with a lowered seizure threshold. No electrographic seizures were recorded however.  Patient did not taper gabapentin.   PLan: The gabapentin will be tapered by 1 capsule every 2 weeks until off the drug. Increase Keppra to 1 tablet in the morning 2 tablets at night for 1 week then 2 tablets twice a day Follow up in 3 months Call for further seizure activity after being on the drug at new dose Nilda RiggsNancy Carolyn Vamsi Apfel, [facility] CenterGNP, [facility]BC, APRN  [facility] [address], [address] [place], [place] [address] [phone]

## 2018-03-31 ENCOUNTER — Telehealth: Payer: Self-pay

## 2018-03-31 ENCOUNTER — Ambulatory Visit: Payer: Medicaid Other | Admitting: Nurse Practitioner

## 2018-03-31 NOTE — Telephone Encounter (Signed)
Patient was a no call/no show for their appointment today.   

## 2018-04-01 ENCOUNTER — Encounter: Payer: Self-pay | Admitting: Nurse Practitioner

## 2018-04-02 ENCOUNTER — Encounter (HOSPITAL_COMMUNITY): Payer: Self-pay

## 2018-04-02 ENCOUNTER — Emergency Department (HOSPITAL_COMMUNITY)
Admission: EM | Admit: 2018-04-02 | Discharge: 2018-04-03 | Disposition: A | Discharge status: Home / Self Care | Payer: Medicaid Other | Attending: Emergency Medicine | Admitting: Emergency Medicine

## 2018-04-02 ENCOUNTER — Other Ambulatory Visit: Payer: Self-pay

## 2018-04-02 DIAGNOSIS — I10 Essential (primary) hypertension: Secondary | ICD-10-CM | POA: Insufficient documentation

## 2018-04-02 DIAGNOSIS — F4325 Adjustment disorder with mixed disturbance of emotions and conduct: Secondary | ICD-10-CM | POA: Diagnosis not present

## 2018-04-02 DIAGNOSIS — R45851 Suicidal ideations: Secondary | ICD-10-CM | POA: Insufficient documentation

## 2018-04-02 DIAGNOSIS — F329 Major depressive disorder, single episode, unspecified: Secondary | ICD-10-CM | POA: Diagnosis present

## 2018-04-02 LAB — COMPREHENSIVE METABOLIC PANEL
ALT: 12 U/L (ref 0–44)
AST: 14 U/L — ABNORMAL LOW (ref 15–41)
Albumin: 4.2 g/dL (ref 3.5–5.0)
Alkaline Phosphatase: 55 U/L (ref 38–126)
Anion gap: 5 (ref 5–15)
BUN: 12 mg/dL (ref 6–20)
CO2: 20 mmol/L — ABNORMAL LOW (ref 22–32)
Calcium: 8.8 mg/dL — ABNORMAL LOW (ref 8.9–10.3)
Chloride: 112 mmol/L — ABNORMAL HIGH (ref 98–111)
Creatinine, Ser: 0.81 mg/dL (ref 0.44–1.00)
GFR calc Af Amer: >60 mL/min (ref >60)
GFR calc non Af Amer: >60 mL/min (ref >60)
Glucose, Bld: 83 mg/dL (ref 70–99)
Potassium: 3.6 mmol/L (ref 3.5–5.1)
Sodium: 137 mmol/L (ref 135–145)
Total Bilirubin: 0.5 mg/dL (ref 0.3–1.2)
Total Protein: 8.1 g/dL (ref 6.5–8.1)

## 2018-04-02 LAB — CBC
HEMATOCRIT: 42.5 % (ref 36.0–46.0)
Hemoglobin: 13.3 g/dL (ref 12.0–15.0)
MCH: 26 pg (ref 26.0–34.0)
MCHC: 31.3 g/dL (ref 30.0–36.0)
MCV: 83.2 fL (ref 80.0–100.0)
Platelets: 267 10*3/uL (ref 150–400)
RBC: 5.11 MIL/uL (ref 3.87–5.11)
RDW: 13.7 % (ref 11.5–15.5)
WBC: 5.8 10*3/uL (ref 4.0–10.5)
nRBC: 0 % (ref 0.0–0.2)

## 2018-04-02 LAB — SALICYLATE LEVEL: Salicylate Lvl: <7 mg/dL (ref 2.8–30.0)

## 2018-04-02 LAB — ETHANOL: Alcohol, Ethyl (B): <10 mg/dL (ref <10)

## 2018-04-02 LAB — I-STAT BETA HCG BLOOD, ED (MC, WL, AP ONLY): I-stat hCG, quantitative: <5 m[IU]/mL (ref <5)

## 2018-04-02 LAB — ACETAMINOPHEN LEVEL: Acetaminophen (Tylenol), Serum: <10 ug/mL — ABNORMAL LOW (ref 10–30)

## 2018-04-02 MED ORDER — ACETAMINOPHEN 325 MG PO TABS: 650.0000 mg | ORAL_TABLET | ORAL | Status: DC | PRN

## 2018-04-02 MED ORDER — GABAPENTIN 100 MG PO CAPS
200.0000 mg | ORAL_CAPSULE | Freq: Two times a day (BID) | ORAL | Status: DC
  Administered 2018-04-02 – 2018-04-03 (×2): 200 mg via ORAL
  Filled 2018-04-02 (×2): qty 2

## 2018-04-02 MED ORDER — LEVETIRACETAM 500 MG PO TABS
500.0000 mg | ORAL_TABLET | Freq: Two times a day (BID) | ORAL | Status: DC
  Administered 2018-04-02 – 2018-04-03 (×2): 500 mg via ORAL
  Filled 2018-04-02 (×2): qty 1
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
## 2018-04-02 NOTE — BH Assessment (Signed)
[facility] Assessment Progress Note  Case was staffed with Arville Care FNP who recommended patient be observed and monitored for safety. Psychiatry will see patient in the a.m.
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
## 2018-04-02 NOTE — ED Notes (Signed)
Bed: [facility]43 Expected date:  Expected time:  Means of arrival:  Comments: Triage 4 

## 2018-04-02 NOTE — ED Notes (Signed)
Interpreter used during assessment. Pt delayed response, interpreter had trouble understanding pt. Pt was able to answer a few questions. Pt say she's suicidal, but refused to answer anymore questions afterwards. Pt safe, will continue to monitor.

## 2018-04-02 NOTE — ED Notes (Signed)
Pt is calm and cooperative.  No distress noted.  15 minute checks and video monitoring in place.

## 2018-04-02 NOTE — ED Notes (Signed)
Pt encouraged to give urine sample

## 2018-04-02 NOTE — ED Notes (Signed)
Bed: [facility] Expected date:  Expected time:  Means of arrival:  Comments: 

## 2018-04-03 DIAGNOSIS — F4325 Adjustment disorder with mixed disturbance of emotions and conduct: Secondary | ICD-10-CM

## 2018-04-03 LAB — RAPID URINE DRUG SCREEN, HOSP PERFORMED
Amphetamines: NOT DETECTED
BARBITURATES: NOT DETECTED
Benzodiazepines: NOT DETECTED
Cocaine: NOT DETECTED
Opiates: NOT DETECTED
Tetrahydrocannabinol: NOT DETECTED

## 2018-04-03 MED ORDER — RISPERIDONE 0.5 MG PO TABS
0.5000 mg | ORAL_TABLET | Freq: Two times a day (BID) | ORAL | Status: DC
  Administered 2018-04-03: 0.5 mg via ORAL
  Filled 2018-04-03: qty 1

## 2018-04-03 MED ORDER — RISPERIDONE 0.5 MG PO TABS: 0.5000 mg | ORAL_TABLET | Freq: Two times a day (BID) | ORAL | 0 refills | Status: DC

## 2018-04-03 MED ORDER — LEVETIRACETAM 500 MG PO TABS: 500.0000 mg | ORAL_TABLET | Freq: Two times a day (BID) | ORAL | 0 refills | Status: DC

## 2018-04-03 NOTE — Progress Notes (Signed)
CSW spoke with patients brother, Natya Marrese [phone], via [employer] to notify him of patients release from hospital. Per previous notes, patient receives taxi voucher to return home. CSW confirmed patients address of [address] [place] [address] and that patient could receive taxi voucher. Brother voiced no concerns during phone conversation- CSW will provide patient with taxi voucher to return home.   Stacy Gardner, LCSW Clinical Social Worker  [employer]  [phone]

## 2018-04-03 NOTE — ED Notes (Signed)
Transported home by cab voucher.  Nanine Means, DNP spoke with pt through the [facility] interpreter and pt agreed to go home and remain safe. This Clinical research associate escorted her to change clothing, called the cab service, and with the [employer] interpreter along, put pt in the cab and handed the cab the voucher. Cab driver closed the door of the cab and this writer walked away at that time.

## 2018-04-03 NOTE — ED Notes (Signed)
Discharge instructions read to pt through [facility] interpreter. She acknowledged understanding and refused to get out of bed because, "They are mean to me at home."

## 2018-04-03 NOTE — Consult Note (Addendum)
[facility] Psych ED Discharge  04/03/2018 11:07 AM Patricia [name]  MRN:  [id number] Principal Problem: Adjustment disorder with mixed disturbance of emotions and conduct Discharge Diagnoses: Principal Problem:   Adjustment disorder with mixed disturbance of emotions and conduct  Subjective: Patient upset with her mother's boyfriend because she felt he yelled at her.  When and sat on the street because she was mad.  Total Time spent with patient: 45 minutes  Past Psychiatric History:  History of CP, IDD with multiple past Emergency Room Visits with similar reports. CSW reports recent CPS visits which do not report con  HPI: 28 yr female well known by staff lying with history of CP, IDD, and low threshold for frustration. Today, she is lying comfortably  in bed interviewed by mental health team utilizing [place] ([place] [phone]) language line for interpretation in patient's preferred language of Swahili. Patient smiled when she heard Swahili dilect. She reports frustration with her mom's boyfriend who lives with her mom and her. She feels her mom's boyfriend hinders her relationship with her mom. When asked if she felt safe she nodded and stated "You can take me back"  and is willing to go home. Her only complaint is that she is hungry at this time, breakfast and lunch provided.  Patient is well known to this ED for similar presentations.  Typically, she gets upset and goes to sit in the street which gets her to the hospital.  Calm and cooperative, no distress, resting quietly in her room.    Past Medical History:  Past Medical History:  Diagnosis Date  . Cerebral palsy (HCC)   . Hypertension   . Seizures (HCC)   . Suicidal behavior    History reviewed. No pertinent surgical history. Family History:  Family History  Problem Relation Age of Onset  . Hepatitis Brother    Family Psychiatric  History: none Social History:  Social History   Substance and Sexual Activity  Alcohol Use Not Currently      Social History   Substance and Sexual Activity  Drug Use Not Currently    Social History   Socioeconomic History  . Marital status: Single    Spouse name: Not on file  . Number of children: 1  . Years of education: Not on file  . Highest education level: Not on file  Occupational History    Comment: none  Social Needs  . Financial resource strain: Not on file  . Food insecurity:    Worry: Not on file    Inability: Not on file  . Transportation needs:    Medical: Not on file    Non-medical: Not on file  Tobacco Use  . Smoking status: Never Smoker  . Smokeless tobacco: Never Used  Substance and Sexual Activity  . Alcohol use: Not Currently  . Drug use: Not Currently  . Sexual activity: Not on file  Lifestyle  . Physical activity:    Days per week: Not on file    Minutes per session: Not on file  . Stress: Not on file  Relationships  . Social connections:    Talks on phone: Not on file    Gets together: Not on file    Attends religious service: Not on file    Active member of club or organization: Not on file    Attends meetings of clubs or organizations: Not on file    Relationship status: Not on file  Other Topics Concern  . Not on file  Social  History Narrative   ** Merged History Encounter **       Lives with mother, family    Has this patient used any form of tobacco in the last 30 days? (Cigarettes, Smokeless Tobacco, Cigars, and/or Pipes) Prescription not provided because: NA  Current Medications: Current Facility-Administered Medications  Medication Dose Route Frequency Provider Last Rate Last Dose  . acetaminophen (TYLENOL) tablet 650 mg  650 mg Oral Q4H PRN Robinson, [place] N, PA-C      . gabapentin (NEURONTIN) capsule 200 mg  200 mg Oral BID Laveda AbbeParks, Laurie Britton, NP   200 mg at 04/03/18 1102  . levETIRAcetam (KEPPRA) tablet 500 mg  500 mg Oral BID Laveda AbbeParks, Laurie Britton, NP   500 mg at 04/03/18 1102   Current Outpatient Medications   Medication Sig Dispense Refill  . levETIRAcetam (KEPPRA) 500 MG tablet Take 500 mg by mouth 2 (two) times daily.    . risperiDONE (RISPERDAL) 0.5 MG tablet Take 0.5 mg by mouth at bedtime.     PTA Medications: (Not in a hospital admission)   Musculoskeletal: Strength & Muscle Tone: within normal limits Gait & Station: not assessed Patient leans: Right  Psychiatric Specialty Exam: Physical Exam  Nursing note and vitals reviewed. Constitutional: She appears well-developed and well-nourished.  HENT:  Head: Normocephalic and atraumatic.  Neck: Normal range of motion. Neck supple.  Cardiovascular: Normal rate and regular rhythm.  Respiratory: Effort normal.  Musculoskeletal: Normal range of motion.  Neurological: She is alert.  Psychiatric: She has a normal mood and affect. Her speech is normal and behavior is normal. Thought content normal. Cognition and memory are impaired. She expresses impulsivity.    Review of Systems  Constitutional: Negative.   Respiratory: Negative for cough.   Psychiatric/Behavioral: Negative for depression, hallucinations, substance abuse and suicidal ideas. The patient is not nervous/anxious.   All other systems reviewed and are negative.   Blood pressure (!) 95/55, pulse 60, temperature 98.5 F (36.9 C), temperature source Oral, resp. rate 14, weight 77.3 kg, SpO2 96 %.Body mass index is 31.17 kg/m.  General Appearance: Fairly Groomed  Eye Contact:  Fair  Speech:  Normal Rate  Volume:  Normal  Mood:  Euthymic  Affect:  Appropriate  Thought Process:  Descriptions of Associations: Circumstantial  Orientation:  Full (Time, Place, and Person)  Thought Content:  Rumination  Suicidal Thoughts:  No  Homicidal Thoughts:  No  Memory:  Immediate;   Fair Recent;   Fair Remote;   Fair  Judgement:  Impaired  Insight:  Shallow  Psychomotor Activity:  Normal  Concentration:  Concentration: Fair and Attention Span: Fair  Recall:  [employer]  Fund of  Knowledge:  Fair  Language:  Good  Akathisia:  No  Handed:  Right  AIMS (if indicated):     Assets:  Housing Physical Health Resilience Social Support  ADL's:  Intact  Cognition:  WNL  Sleep:      Demographic Factors:  Unemployed  Loss Factors: NA  Historical Factors: Impulsivity  Risk Reduction Factors:   Sense of responsibility to family, Living with another person, especially a relative and Positive social support  Continued Clinical Symptoms:  None  Cognitive Features That Contribute To Risk:  None    Suicide Risk:  Minimal: No identifiable suicidal ideation.  Patients presenting with no risk factors but with morbid ruminations; may be classified as minimal risk based on the severity of the depressive symptoms   Plan Of Care/Follow-up recommendations:  Adjustment disorder with mixed  disturbance of emotions and conduct: -Increased Risperdal 0.5 mg daily to BID  -Child psychotherapistocial worker assisted with taxi to send her home and called her brother who knows she is on the way Activity:  As tolerated Diet:  Regular  Disposition: discharge home Nanine MeansLORD, JAMISON, NP 04/03/2018, 11:07 AM  Patient seen face-to-face for psychiatric evaluation, chart reviewed and case discussed with the physician extender and developed treatment plan. Reviewed the information documented and agree with the treatment plan. Thedore MinsMojeed Kortnie Stovall, MD

## 2018-04-07 NOTE — Progress Notes (Deleted)
[facility]  PATIENT: Patricia [name] DOB: [birth year]   REASON FOR VISIT: Follow-up for seizure disorder HISTORY FROM: Interpreter brother and case worker    HISTORY OF PRESENT ILLNESS:UPDATE 10/9/2019CM PatriciaCox, 28 year old female returns for follow-up with a history of seizure disorder since the age of 37.  She speaks Swahili and has an Equities trader.  She also has a history of being cognitively slow.  Her seizure events are associated with dystonic posturing of the fingers in the hands and then fall with a generalized seizure event.  The seizure usually resolves in 2 to 3 minutes.  CT of the head performed 08/16/2017 was normal without acute intracranial process.  EEG performed 09/08/2017 This is an abnormal EEG recording secondary to right hemispheric slowing. This study suggests a right brain abnormality with a lowered seizure threshold. No electrographic seizures were recorded however.  The patient's brother states today that she has had approximately 10 seizures since her last visit here with Dr. Anne Hahn.  Her last one occurred last Thursday.  They have not called into the office to let us know that she was continuing with seizure events.  She returns for reevaluation    08/13/17 Patricia [name]. Figiel is a 28 year old left-handed African female with a history of seizures since age 71.  The patient comes in with her brother and an interpreter today.  The seizures were apparently of spontaneous onset, there is no history of head trauma.  There is no family history of seizures.  The patient has been noted to be somewhat cognitively slow throughout her life.  The patient is having about one seizure event a month.  The episodes are associated with dystonic posturing of the fingers of the hands, the patient would then begin to spin around and then she may fall down with a generalized seizure event.  The patient may resolve the seizure within 2 or 3 minutes, it may take her another  minute or 2 to start speaking more normally without confusion.  The patient reports dizziness prior to the onset of the seizure.  The patient has no focal numbness or weakness of the face, arms, legs.  She has been on gabapentin taking 400 mg 3 times daily but this has not been effective and fully controlling the seizures.  She apparently has never had a scanning procedure such as CT or MRI of the brain, she has never had an EEG study.  She has 4 brothers and 6 sisters, one brother has hepatitis B, otherwise the siblings are in good health.  The patient comes into the office today for an evaluation.  REVIEW OF SYSTEMS: Full 14 system review of systems performed and notable only for those listed, all others are neg:  Constitutional: Fatigue Cardiovascular: neg Ear/Nose/Throat: neg  Skin: neg Eyes: neg Respiratory: neg Gastroitestinal: neg  Hematology/Lymphatic: neg  Endocrine: neg Musculoskeletal:neg Allergy/Immunology: neg Neurological: Seizure, dizziness Psychiatric: neg Sleep : neg   ALLERGIES: No Known Allergies  HOME MEDICATIONS: Outpatient Medications Prior to Visit  Medication Sig Dispense Refill  . levETIRAcetam (KEPPRA) 500 MG tablet Take 1 tablet (500 mg total) by mouth 2 (two) times daily. 60 tablet 0  . risperiDONE (RISPERDAL) 0.5 MG tablet Take 1 tablet (0.5 mg total) by mouth 2 (two) times daily. 60 tablet 0   No facility-administered medications prior to visit.     PAST MEDICAL HISTORY: Past Medical History:  Diagnosis Date  . Cerebral palsy (HCC)   . Hypertension   . Seizures (HCC)   .  Suicidal behavior     PAST SURGICAL HISTORY: No past surgical history on file.  FAMILY HISTORY: Family History  Problem Relation Age of Onset  . Hepatitis Brother     SOCIAL HISTORY: Social History   Socioeconomic History  . Marital status: Single    Spouse name: Not on file  . Number of children: 1  . Years of education: Not on file  . Highest education level:  Not on file  Occupational History    Comment: none  Social Needs  . Financial resource strain: Not on file  . Food insecurity:    Worry: Not on file    Inability: Not on file  . Transportation needs:    Medical: Not on file    Non-medical: Not on file  Tobacco Use  . Smoking status: Never Smoker  . Smokeless tobacco: Never Used  Substance and Sexual Activity  . Alcohol use: Not Currently  . Drug use: Not Currently  . Sexual activity: Not on file  Lifestyle  . Physical activity:    Days per week: Not on file    Minutes per session: Not on file  . Stress: Not on file  Relationships  . Social connections:    Talks on phone: Not on file    Gets together: Not on file    Attends religious service: Not on file    Active member of club or organization: Not on file    Attends meetings of clubs or organizations: Not on file    Relationship status: Not on file  . Intimate partner violence:    Fear of current or ex partner: Not on file    Emotionally abused: Not on file    Physically abused: Not on file    Forced sexual activity: Not on file  Other Topics Concern  . Not on file  Social History Narrative   ** Merged History Encounter **       Lives with mother, family     PHYSICAL EXAM  There were no vitals filed for this visit. There is no height or weight on file to calculate BMI.  Generalized: Well developed, in no acute distress  Skin no rash or edema Neurological examination   Mentation: Alert and cooperative she has difficulty following verbal commands.  She does not speak English  Cranial nerve II-XII: Pupils were equal round reactive to light extraocular movements were full, visual field were full ,when counting fingers, the patient consistently misses the fingers on the right side with double simultaneous stimulation. Facial sensation and strength were normal. hearing was intact to finger rubbing bilaterally. Uvula tongue midline. head turning and shoulder shrug  were normal and symmetric.Tongue protrusion into cheek strength was normal. Motor: normal bulk and tone, full strength in the BUE, BLE,  Sensory: normal and symmetric to light touch, pinprick, and  Vibration, in upper and lower extremities Coordination: finger-nose-finger, heel-to-shin bilaterally, no dysmetria Reflexes: Brachioradialis 2/2, biceps 2/2, triceps 2/2, patellar 2/2, Achilles 2/2, plantar responses were flexor bilaterally. Gait and Station: Rising up from seated position without assistance, normal stance,  moderate stride, good arm swing, smooth turning, able to perform tiptoe, and heel walking without difficulty. Tandem gait is steady  DIAGNOSTIC DATA (LABS, IMAGING, TESTING) - I reviewed patient records, labs, notes, testing and imaging myself where available.  Lab Results  Component Value Date   WBC 5.8 04/02/2018   HGB 13.3 04/02/2018   HCT 42.5 04/02/2018   MCV 83.2 04/02/2018   PLT 267 04/02/2018        Component Value Date/Time   NA 137 04/02/2018 1309   NA 140 08/04/2017 1111   K 3.6 04/02/2018 1309   CL 112 (H) 04/02/2018 1309   CO2 20 (L) 04/02/2018 1309   GLUCOSE 83 04/02/2018 1309   BUN 12 04/02/2018 1309   BUN 8 08/04/2017 1111   CREATININE 0.81 04/02/2018 1309   CALCIUM 8.8 (L) 04/02/2018 1309   PROT 8.1 04/02/2018 1309   PROT 7.4 08/04/2017 1111   ALBUMIN 4.2 04/02/2018 1309   ALBUMIN 4.1 08/04/2017 1111   AST 14 (L) 04/02/2018 1309   ALT 12 04/02/2018 1309   ALKPHOS 55 04/02/2018 1309   BILITOT 0.5 04/02/2018 1309   BILITOT <0.2 08/04/2017 1111   GFRNONAA >60 04/02/2018 1309   GFRAA >60 04/02/2018 1309    Lab Results  Component Value Date   TSH 1.780 08/04/2017      ASSESSMENT AND PLAN  28 y.o. year old female  has a past medical history of Cerebral palsy (HCC), Hypertension, Seizures (HCC), and Suicidal behavior. 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  Patient was placed on Keppra when last seen  by Dr. Anne Hahn 500 mg twice daily.  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  CT of the head was normal on 08/16/17 EEG performed 09/08/2017 This is an abnormal EEG recording secondary to right hemispheric slowing. This study suggests a right brain abnormality with a lowered seizure threshold. No electrographic seizures were recorded however.  Patient did not taper gabapentin.   PLan: The gabapentin will be tapered by 1 capsule every 2 weeks until off the drug. Increase Keppra to 1 tablet in the morning 2 tablets at night for 1 week then 2 tablets twice a day Follow up in 3 months Call for further seizure activity after being on the drug at new dose Nilda Riggs, [facility], [facility], APRN  [facility] [address] [place], [place] [address] [phone]

## 2018-04-08 ENCOUNTER — Ambulatory Visit: Payer: Medicaid Other | Admitting: Nurse Practitioner

## 2018-04-09 NOTE — Progress Notes (Deleted)
[facility]  PATIENT: Patricia [name] DOB: [birth year]   REASON FOR VISIT: Follow-up for seizure disorder HISTORY FROM: Interpreter brother and case worker    HISTORY OF PRESENT ILLNESS:UPDATE 10/9/2019CM Patricia [name], 28 year old female returns for follow-up with a history of seizure disorder since the age of 37.  She speaks Swahili and has an Equities trader.  She also has a history of being cognitively slow.  Her seizure events are associated with dystonic posturing of the fingers in the hands and then fall with a generalized seizure event.  The seizure usually resolves in 2 to 3 minutes.  CT of the head performed 08/16/2017 was normal without acute intracranial process.  EEG performed 09/08/2017 This is an abnormal EEG recording secondary to right hemispheric slowing. This study suggests a right brain abnormality with a lowered seizure threshold. No electrographic seizures were recorded however.  The patient's brother states today that she has had approximately 10 seizures since her last visit here with Dr. Anne Hahn.  Her last one occurred last Thursday.  They have not called into the office to let Patricia [name] know that she was continuing with seizure events.  She returns for reevaluation    08/13/17 KWMs. Patricia [name] is a 28 year old left-handed African female with a history of seizures since age 71.  The patient comes in with her brother and an interpreter today.  The seizures were apparently of spontaneous onset, there is no history of head trauma.  There is no family history of seizures.  The patient has been noted to be somewhat cognitively slow throughout her life.  The patient is having about one seizure event a month.  The episodes are associated with dystonic posturing of the fingers of the hands, the patient would then begin to spin around and then she may fall down with a generalized seizure event.  The patient may resolve the seizure within 2 or 3 minutes, it may take her another  minute or 2 to start speaking more normally without confusion.  The patient reports dizziness prior to the onset of the seizure.  The patient has no focal numbness or weakness of the face, arms, legs.  She has been on gabapentin taking 400 mg 3 times daily but this has not been effective and fully controlling the seizures.  She apparently has never had a scanning procedure such as CT or MRI of the brain, she has never had an EEG study.  She has 4 brothers and 6 sisters, one brother has hepatitis B, otherwise the siblings are in good health.  The patient comes into the office today for an evaluation.  REVIEW OF SYSTEMS: Full 14 system review of systems performed and notable only for those listed, all others are neg:  Constitutional: Fatigue Cardiovascular: neg Ear/Nose/Throat: neg  Skin: neg Eyes: neg Respiratory: neg Gastroitestinal: neg  Hematology/Lymphatic: neg  Endocrine: neg Musculoskeletal:neg Allergy/Immunology: neg Neurological: Seizure, dizziness Psychiatric: neg Sleep : neg   ALLERGIES: No Known Allergies  HOME MEDICATIONS: Outpatient Medications Prior to Visit  Medication Sig Dispense Refill  . levETIRAcetam (KEPPRA) 500 MG tablet Take 1 tablet (500 mg total) by mouth 2 (two) times daily. 60 tablet 0  . risperiDONE (RISPERDAL) 0.5 MG tablet Take 1 tablet (0.5 mg total) by mouth 2 (two) times daily. 60 tablet 0   No facility-administered medications prior to visit.     PAST MEDICAL HISTORY: Past Medical History:  Diagnosis Date  . Cerebral palsy (HCC)   . Hypertension   . Seizures (HCC)   .  Suicidal behavior     PAST SURGICAL HISTORY: No past surgical history on file.  FAMILY HISTORY: Family History  Problem Relation Age of Onset  . Hepatitis Brother     SOCIAL HISTORY: Social History   Socioeconomic History  . Marital status: Single    Spouse name: Not on file  . Number of children: 1  . Years of education: Not on file  . Highest education level:  Not on file  Occupational History    Comment: none  Social Needs  . Financial resource strain: Not on file  . Food insecurity:    Worry: Not on file    Inability: Not on file  . Transportation needs:    Medical: Not on file    Non-medical: Not on file  Tobacco Use  . Smoking status: Never Smoker  . Smokeless tobacco: Never Used  Substance and Sexual Activity  . Alcohol use: Not Currently  . Drug use: Not Currently  . Sexual activity: Not on file  Lifestyle  . Physical activity:    Days per week: Not on file    Minutes per session: Not on file  . Stress: Not on file  Relationships  . Social connections:    Talks on phone: Not on file    Gets together: Not on file    Attends religious service: Not on file    Active member of club or organization: Not on file    Attends meetings of clubs or organizations: Not on file    Relationship status: Not on file  . Intimate partner violence:    Fear of current or ex partner: Not on file    Emotionally abused: Not on file    Physically abused: Not on file    Forced sexual activity: Not on file  Other Topics Concern  . Not on file  Social History Narrative   ** Merged History Encounter **       Lives with mother, family     PHYSICAL EXAM  There were no vitals filed for this visit. There is no height or weight on file to calculate BMI.  Generalized: Well developed, in no acute distress  Skin no rash or edema Neurological examination   Mentation: Alert and cooperative she has difficulty following verbal commands.  She does not speak English  Cranial nerve II-XII: Pupils were equal round reactive to light extraocular movements were full, visual field were full ,when counting fingers, the patient consistently misses the fingers on the right side with double simultaneous stimulation. Facial sensation and strength were normal. hearing was intact to finger rubbing bilaterally. Uvula tongue midline. head turning and shoulder shrug  were normal and symmetric.Tongue protrusion into cheek strength was normal. Motor: normal bulk and tone, full strength in the BUE, BLE,  Sensory: normal and symmetric to light touch, pinprick, and  Vibration, in upper and lower extremities Coordination: finger-nose-finger, heel-to-shin bilaterally, no dysmetria Reflexes: Brachioradialis 2/2, biceps 2/2, triceps 2/2, patellar 2/2, Achilles 2/2, plantar responses were flexor bilaterally. Gait and Station: Rising up from seated position without assistance, normal stance,  moderate stride, good arm swing, smooth turning, able to perform tiptoe, and heel walking without difficulty. Tandem gait is steady  DIAGNOSTIC DATA (LABS, IMAGING, TESTING) - I reviewed patient records, labs, notes, testing and imaging myself where available.  Lab Results  Component Value Date   WBC 5.8 04/02/2018   HGB 13.3 04/02/2018   HCT 42.5 04/02/2018   MCV 83.2 04/02/2018   PLT 267 04/02/2018  Component Value Date/Time   NA 137 04/02/2018 1309   NA 140 08/04/2017 1111   K 3.6 04/02/2018 1309   CL 112 (H) 04/02/2018 1309   CO2 20 (L) 04/02/2018 1309   GLUCOSE 83 04/02/2018 1309   BUN 12 04/02/2018 1309   BUN 8 08/04/2017 1111   CREATININE 0.81 04/02/2018 1309   CALCIUM 8.8 (L) 04/02/2018 1309   PROT 8.1 04/02/2018 1309   PROT 7.4 08/04/2017 1111   ALBUMIN 4.2 04/02/2018 1309   ALBUMIN 4.1 08/04/2017 1111   AST 14 (L) 04/02/2018 1309   ALT 12 04/02/2018 1309   ALKPHOS 55 04/02/2018 1309   BILITOT 0.5 04/02/2018 1309   BILITOT <0.2 08/04/2017 1111   GFRNONAA >60 04/02/2018 1309   GFRAA >60 04/02/2018 1309    Lab Results  Component Value Date   TSH 1.780 08/04/2017      ASSESSMENT AND PLAN  28 y.o. year old female  has a past medical history of Cerebral palsy (HCC), Hypertension, Seizures (HCC), and Suicidal behavior. 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  Patient was placed on Keppra when last seen  by Dr. Anne Hahn 500 mg twice daily.  Brother reports she has had approximately 10 seizures since that time but they did not call into the office to let Patricia [name] know.Right homonymous visual field deficit by clinical examination.  CT of the head was normal on 08/16/17 EEG performed 09/08/2017 This is an abnormal EEG recording secondary to right hemispheric slowing. This study suggests a right brain abnormality with a lowered seizure threshold. No electrographic seizures were recorded however.  Patient did not taper gabapentin.   PLan: The gabapentin will be tapered by 1 capsule every 2 weeks until off the drug. Increase Keppra to 1 tablet in the morning 2 tablets at night for 1 week then 2 tablets twice a day Follow up in 3 months Call for further seizure activity after being on the drug at new dose Nilda Riggs, [facility], [facility], APRN  [facility] [address] [place], [place] [address] [phone]

## 2018-04-12 ENCOUNTER — Telehealth: Payer: Self-pay | Admitting: *Deleted

## 2018-04-12 ENCOUNTER — Ambulatory Visit: Payer: Medicaid Other | Admitting: Nurse Practitioner

## 2018-04-12 NOTE — Telephone Encounter (Signed)
Pt no showed appt for today.  

## 2018-04-13 ENCOUNTER — Encounter: Payer: Self-pay | Admitting: Nurse Practitioner

## 2018-04-25 ENCOUNTER — Emergency Department (HOSPITAL_COMMUNITY)
Admission: EM | Admit: 2018-04-25 | Discharge: 2018-04-27 | Disposition: A | Discharge status: Other Acute Inpatient Hospital | Payer: Medicaid Other | Attending: Emergency Medicine | Admitting: Emergency Medicine

## 2018-04-25 DIAGNOSIS — F99 Mental disorder, not otherwise specified: Secondary | ICD-10-CM | POA: Diagnosis present

## 2018-04-25 DIAGNOSIS — R45851 Suicidal ideations: Secondary | ICD-10-CM | POA: Insufficient documentation

## 2018-04-25 DIAGNOSIS — F333 Major depressive disorder, recurrent, severe with psychotic symptoms: Secondary | ICD-10-CM | POA: Insufficient documentation

## 2018-04-25 DIAGNOSIS — F28 Other psychotic disorder not due to a substance or known physiological condition: Secondary | ICD-10-CM

## 2018-04-25 LAB — CBC WITH DIFFERENTIAL/PLATELET
Abs Immature Granulocytes: 0.02 10*3/uL (ref 0.00–0.07)
Basophils Absolute: 0.1 10*3/uL (ref 0.0–0.1)
Basophils Relative: 1 %
Eosinophils Absolute: 0.5 10*3/uL (ref 0.0–0.5)
Eosinophils Relative: 6 %
HCT: 37.5 % (ref 36.0–46.0)
Hemoglobin: 11.8 g/dL — ABNORMAL LOW (ref 12.0–15.0)
Immature Granulocytes: 0 %
Lymphocytes Relative: 41 %
Lymphs Abs: 3.1 10*3/uL (ref 0.7–4.0)
MCH: 26.2 pg (ref 26.0–34.0)
MCHC: 31.5 g/dL (ref 30.0–36.0)
MCV: 83.1 fL (ref 80.0–100.0)
Monocytes Absolute: 0.6 10*3/uL (ref 0.1–1.0)
Monocytes Relative: 8 %
Neutro Abs: 3.4 10*3/uL (ref 1.7–7.7)
Neutrophils Relative %: 44 %
Platelets: 221 10*3/uL (ref 150–400)
RBC: 4.51 MIL/uL (ref 3.87–5.11)
RDW: 13.7 % (ref 11.5–15.5)
WBC: 7.6 10*3/uL (ref 4.0–10.5)
nRBC: 0 % (ref 0.0–0.2)

## 2018-04-25 LAB — I-STAT BETA HCG BLOOD, ED (MC, WL, AP ONLY): I-stat hCG, quantitative: <5 m[IU]/mL (ref <5)

## 2018-04-25 LAB — ETHANOL: Alcohol, Ethyl (B): <10 mg/dL (ref <10)

## 2018-04-25 LAB — SALICYLATE LEVEL: Salicylate Lvl: <7 mg/dL (ref 2.8–30.0)

## 2018-04-25 LAB — COMPREHENSIVE METABOLIC PANEL
ALK PHOS: 65 U/L (ref 38–126)
ALT: 11 U/L (ref 0–44)
AST: 17 U/L (ref 15–41)
Albumin: 3.7 g/dL (ref 3.5–5.0)
Anion gap: 7 (ref 5–15)
BUN: 12 mg/dL (ref 6–20)
CO2: 17 mmol/L — AB (ref 22–32)
Calcium: 9.1 mg/dL (ref 8.9–10.3)
Chloride: 112 mmol/L — ABNORMAL HIGH (ref 98–111)
Creatinine, Ser: 0.93 mg/dL (ref 0.44–1.00)
GFR calc Af Amer: >60 mL/min (ref >60)
GFR calc non Af Amer: >60 mL/min (ref >60)
Glucose, Bld: 94 mg/dL (ref 70–99)
Potassium: 3.6 mmol/L (ref 3.5–5.1)
SODIUM: 136 mmol/L (ref 135–145)
Total Bilirubin: 0.5 mg/dL (ref 0.3–1.2)
Total Protein: 7.3 g/dL (ref 6.5–8.1)

## 2018-04-25 LAB — URINALYSIS, ROUTINE W REFLEX MICROSCOPIC
BILIRUBIN URINE: NEGATIVE
Glucose, UA: NEGATIVE mg/dL
Hgb urine dipstick: NEGATIVE
KETONES UR: NEGATIVE mg/dL
Leukocytes,Ua: NEGATIVE
Nitrite: NEGATIVE
Protein, ur: NEGATIVE mg/dL
Specific Gravity, Urine: 1.032 — ABNORMAL HIGH (ref 1.005–1.030)
pH: 5 (ref 5.0–8.0)

## 2018-04-25 LAB — ACETAMINOPHEN LEVEL: Acetaminophen (Tylenol), Serum: <10 ug/mL — ABNORMAL LOW (ref 10–30)

## 2018-04-25 NOTE — ED Notes (Signed)
Culture sent to lab with urine 
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
## 2018-04-26 ENCOUNTER — Encounter (HOSPITAL_COMMUNITY): Payer: Self-pay | Admitting: Behavioral Health

## 2018-04-26 LAB — RAPID URINE DRUG SCREEN, HOSP PERFORMED
Amphetamines: NOT DETECTED
Barbiturates: NOT DETECTED
Benzodiazepines: NOT DETECTED
Cocaine: NOT DETECTED
Opiates: NOT DETECTED
Tetrahydrocannabinol: NOT DETECTED

## 2018-04-26 MED ORDER — LEVETIRACETAM 500 MG PO TABS
500.0000 mg | ORAL_TABLET | Freq: Two times a day (BID) | ORAL | Status: DC
  Administered 2018-04-26 – 2018-04-27 (×3): 500 mg via ORAL
  Filled 2018-04-26 (×3): qty 1

## 2018-04-26 MED ORDER — LEVETIRACETAM 500 MG PO TABS: 500.0000 mg | ORAL_TABLET | Freq: Two times a day (BID) | ORAL | Status: DC

## 2018-04-26 MED ORDER — ACETAMINOPHEN 325 MG PO TABS: 650.0000 mg | ORAL_TABLET | ORAL | Status: DC | PRN

## 2018-04-26 NOTE — BH Assessment (Signed)
Received TTS consult request. Pt speaks Swahili and [employer] was used via telephone. Pt was invited to communicate several times but did not give any response. TTS will complete assessment when Pt is willing to talk.   Pamalee Leyden, [facility], [facility], [facility] Triage Specialist [phone]

## 2018-04-26 NOTE — ED Triage Notes (Signed)
PT awake and alert ,TTS done.

## 2018-04-26 NOTE — Progress Notes (Addendum)
04/26/2018 at 1958: Pt accepted at [facility]. Can arrive whenever.  Room: 721 Bed 1 Accepting: Dr. Paulla Dolly Attending: " " Call to Report: [phone]  Address:  [address] [place], [place] [address]  Please call when transport arrives

## 2018-04-26 NOTE — Progress Notes (Signed)
Pt meets inpatient criteria per Reola Calkins, NP. Referral information has been sent to the following hospitals for review:  [facility]  [facility]  [facility]  [facility]  [facility]  [facility]   Disposition will continue to assist with inpatient placement needs.   Wells Guiles, LCSW, LCAS Disposition CSW [facility]/TTS [phone]
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
## 2018-04-26 NOTE — ED Triage Notes (Signed)
TTS done today, IVC papers faxed to [facility]

## 2018-04-26 NOTE — ED Notes (Signed)
Breakfast Tray ordered  
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
## 2018-04-27 ENCOUNTER — Encounter (HOSPITAL_COMMUNITY): Payer: Self-pay

## 2018-04-27 ENCOUNTER — Other Ambulatory Visit: Payer: Self-pay

## 2018-04-27 NOTE — ED Provider Notes (Signed)
Pt has been accepted to [facility]. Vitals are stable. No complaints per nursing staff.   Bethel Born, PA-C 04/27/18 6967    Azalia Bilis, MD 04/27/18 1056

## 2018-04-27 NOTE — Discharge Instructions (Addendum)
Transfer to [employer] [facility]

## 2018-04-27 NOTE — ED Notes (Signed)
Patient was given Cookies and Drink.

## 2018-04-27 NOTE — ED Provider Notes (Signed)
BH team indicates patient accepted to [facility], Dr Earlene Plater.  Pt is awake and alert. Denies any c/o. Appears stable, nad. Is eating/drinking.   Pt currently appears stable for transfer.      Cathren Laine, MD 04/27/18 1051

## 2018-04-27 NOTE — ED Notes (Signed)
Spoke c pt via interpreter, advised of Tx plan, accepted to [facility]. Pt voiced understanding & agreement, offered for pt to contact her family to advise, pt responded stating she wants them to leave her alone.

## 2018-04-27 NOTE — ED Notes (Signed)
Pt is being transported to [place] via [facility] from [employer]. Deputy was given pt's one belongings bag.

## 2018-04-27 NOTE — ED Notes (Signed)
Pt called family from the phone at the desk.

## 2018-10-24 ENCOUNTER — Encounter (HOSPITAL_COMMUNITY): Payer: Self-pay

## 2018-10-24 ENCOUNTER — Emergency Department (HOSPITAL_COMMUNITY)
Admission: EM | Admit: 2018-10-24 | Discharge: 2018-10-25 | Disposition: A | Discharge status: Home / Self Care | Payer: Medicaid Other | Attending: Emergency Medicine | Admitting: Emergency Medicine

## 2018-10-24 ENCOUNTER — Other Ambulatory Visit: Payer: Self-pay

## 2018-10-24 DIAGNOSIS — F32A Depression, unspecified: Secondary | ICD-10-CM

## 2018-10-24 DIAGNOSIS — I10 Essential (primary) hypertension: Secondary | ICD-10-CM | POA: Insufficient documentation

## 2018-10-24 DIAGNOSIS — R45851 Suicidal ideations: Secondary | ICD-10-CM | POA: Insufficient documentation

## 2018-10-24 DIAGNOSIS — G809 Cerebral palsy, unspecified: Secondary | ICD-10-CM | POA: Insufficient documentation

## 2018-10-24 DIAGNOSIS — Z79899 Other long term (current) drug therapy: Secondary | ICD-10-CM | POA: Diagnosis not present

## 2018-10-24 DIAGNOSIS — F329 Major depressive disorder, single episode, unspecified: Secondary | ICD-10-CM | POA: Insufficient documentation

## 2018-10-24 DIAGNOSIS — F4325 Adjustment disorder with mixed disturbance of emotions and conduct: Secondary | ICD-10-CM | POA: Diagnosis present

## 2018-10-24 DIAGNOSIS — Z046 Encounter for general psychiatric examination, requested by authority: Secondary | ICD-10-CM | POA: Insufficient documentation

## 2018-10-24 DIAGNOSIS — T1491XA Suicide attempt, initial encounter: Secondary | ICD-10-CM

## 2018-10-24 LAB — COMPREHENSIVE METABOLIC PANEL
ALT: 14 U/L (ref 0–44)
AST: 16 U/L (ref 15–41)
Albumin: 4.3 g/dL (ref 3.5–5.0)
Alkaline Phosphatase: 58 U/L (ref 38–126)
Anion gap: 10 (ref 5–15)
BUN: 17 mg/dL (ref 6–20)
CO2: 18 mmol/L — ABNORMAL LOW (ref 22–32)
Calcium: 8.9 mg/dL (ref 8.9–10.3)
Chloride: 110 mmol/L (ref 98–111)
Creatinine, Ser: 1.14 mg/dL — ABNORMAL HIGH (ref 0.44–1.00)
GFR calc Af Amer: >60 mL/min (ref >60)
GFR calc non Af Amer: >60 mL/min (ref >60)
Glucose, Bld: 97 mg/dL (ref 70–99)
Potassium: 3.3 mmol/L — ABNORMAL LOW (ref 3.5–5.1)
Sodium: 138 mmol/L (ref 135–145)
Total Bilirubin: 0.5 mg/dL (ref 0.3–1.2)
Total Protein: 8.5 g/dL — ABNORMAL HIGH (ref 6.5–8.1)

## 2018-10-24 LAB — CBC
HCT: 38.5 % (ref 36.0–46.0)
Hemoglobin: 12.1 g/dL (ref 12.0–15.0)
MCH: 25.9 pg — ABNORMAL LOW (ref 26.0–34.0)
MCHC: 31.4 g/dL (ref 30.0–36.0)
MCV: 82.4 fL (ref 80.0–100.0)
Platelets: 246 10*3/uL (ref 150–400)
RBC: 4.67 MIL/uL (ref 3.87–5.11)
RDW: 14.1 % (ref 11.5–15.5)
WBC: 6.8 10*3/uL (ref 4.0–10.5)
nRBC: 0 % (ref 0.0–0.2)

## 2018-10-24 LAB — ACETAMINOPHEN LEVEL: Acetaminophen (Tylenol), Serum: <10 ug/mL — ABNORMAL LOW (ref 10–30)

## 2018-10-24 LAB — ETHANOL: Alcohol, Ethyl (B): <10 mg/dL (ref <10)

## 2018-10-24 LAB — SALICYLATE LEVEL: Salicylate Lvl: <7 mg/dL (ref 2.8–30.0)

## 2018-10-24 LAB — I-STAT BETA HCG BLOOD, ED (MC, WL, AP ONLY): I-stat hCG, quantitative: <5 m[IU]/mL (ref <5)
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
## 2018-10-24 NOTE — ED Provider Notes (Signed)
[facility]-EMERGENCY DEPT Provider Note   CSN: [id number] Arrival date & time: 10/24/18  2126     History   Chief Complaint Chief Complaint  Patient presents with  . Suicidal    HPI Patricia [name] is a 28 y.o. female.     The history is provided by the patient. A language interpreter was used.  Depression This is a new problem. The problem occurs constantly. The problem has not changed since onset.Nothing aggravates the symptoms. Nothing relieves the symptoms. She has tried nothing for the symptoms. The treatment provided mild relief.  Pt reported to have been lying in the street on [address].  Pt has a history of depression. She speaks swahili.  Pt reports by interpreteur that she wanted to hurt herself because her family is mean to her   Past Medical History:  Diagnosis Date  . Cerebral palsy (HCC)   . Hypertension   . Seizures (HCC)   . Suicidal behavior     Patient Active Problem List   Diagnosis Date Noted  . Post-ictal state (HCC)   . Post-ictal confusion 08/16/2017  . 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 Refugee health examination 07/16/2017  . Language barrier 07/16/2017  . Seizure disorder (HCC) 07/16/2017  . Hepatitis B 07/16/2017  . 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 No pertinent surgical history.   OB History   No obstetric history on file.      Home Medications    Prior to Admission medications   Medication Sig Start Date End Date Taking? Authorizing Provider  levETIRAcetam (KEPPRA) 500 MG tablet Take 1 tablet (500 mg total) by mouth 2 (two) times daily. 04/03/18   Charm RingsLord, Jamison Y, NP  risperiDONE (RISPERDAL) 0.5 MG tablet Take 1 tablet (0.5 mg total) by mouth 2 (two) times daily. 04/03/18   Charm RingsLord, Jamison Y, NP    Family History Family History  Problem Relation Age of Onset  . Hepatitis Brother     Social History Social History   Tobacco Use  . Smoking  status: Never Smoker  Substance Use Topics  . Alcohol use: Yes    Comment: occasional alcohol use  . Drug use: Never     Allergies   Patient has no known allergies.   Review of Systems Review of Systems  Psychiatric/Behavioral: Positive for depression.  All other systems reviewed and are negative.    Physical Exam Updated Vital Signs BP (!) 135/98 (BP Location: Left Arm)   Pulse 68   Temp 98.6 F (37 C) (Oral)   Resp 18   Ht 5\' 7"  (1.702 m)   Wt 77.1 kg   SpO2 100%   BMI 26.63 kg/m   Physical Exam Vitals signs and nursing note reviewed.  HENT:     Head: Normocephalic.     Nose: Nose normal.     Mouth/Throat:     Mouth: Mucous membranes are moist.  Neck:     Musculoskeletal: Normal range of motion.  Cardiovascular:     Rate and Rhythm: Normal rate.     Pulses: Normal pulses.  Pulmonary:     Effort: Pulmonary effort is normal.  Abdominal:     General: Abdomen is flat.  Musculoskeletal: Normal range of motion.  Skin:    General: Skin is warm.  Neurological:     General: No focal deficit present.     Mental Status: She is alert.  Psychiatric:  Mood and Affect: Mood normal.      ED Treatments / Results  Labs (all labs ordered are listed, but only abnormal results are displayed) Labs Reviewed  COMPREHENSIVE METABOLIC PANEL - Abnormal; Notable for the following components:      Result Value   Potassium 3.3 (*)    CO2 18 (*)    Creatinine, Ser 1.14 (*)    Total Protein 8.5 (*)    All other components within normal limits  ACETAMINOPHEN LEVEL - Abnormal; Notable for the following components:   Acetaminophen (Tylenol), Serum <10 (*)    All other components within normal limits  CBC - Abnormal; Notable for the following components:   MCH 25.9 (*)    All other components within normal limits  ETHANOL  SALICYLATE LEVEL  RAPID URINE DRUG SCREEN, HOSP PERFORMED  I-STAT BETA HCG BLOOD, ED (MC, WL, AP ONLY)    EKG None  Radiology No results  found.  Procedures Procedures (including critical care time)  Medications Ordered in ED Medications - No data to display   Initial Impression / Assessment and Plan / ED Course  I have reviewed the triage vital signs and the nursing notes.  Pertinent labs & imaging results that were available during my care of the patient were reviewed by me and considered in my medical decision making (see chart for details).       TTS consult    Final Clinical Impressions(s) / ED Diagnoses   Final diagnoses:  Suicidal behavior with attempted self-injury [facility])  Depression, unspecified depression type    ED Discharge Orders    None       Fransico Meadow, [place] 10/25/18 0014    Orpah Greek, MD 10/25/18 [phone]

## 2018-10-25 NOTE — Discharge Instructions (Signed)
For your mental health needs, you are advised to follow up with [facility].  Call them at your earliest opportunity to schedule an intake appointment: ° °     [facility] °     [address] °     [place], [place] [address] °     [phone] °     Crisis number: [phone] °
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
## 2018-10-25 NOTE — BH Assessment (Addendum)
[facility] Assessment Progress Note  Per Buford Dresser, DO, this pt does not require psychiatric hospitalization at this time.  Pt is to be discharged from [facility].  Earleen Newport, FNP requests that this Dentist pt's case Freight forwarder.  After ascertaining that pt is a [facility] resident with [place], this Probation officer contacted the [facility] to ask about services that pt is currently receiving.  Call was placed at 11:42, and I spoke to [place].  She reports that pt does not have a care coordinator at this time.  Pt has been hospitalized at [facility] in the past, but they have no indication with whom she was to follow up.  This will be staffed with [facility] as soon as I am able to reach her, but for now, discharge instructions advise pt to follow up with [facility].  Pt's nurse, Eustaquio Maize, has been notified.  Jalene Mullet, MA Triage Specialist [phone]   Addendum:  After staffing findings with [facility], she agrees to discharge pt with recommendation to follow up with [facility], per discharge instructions.  Eustaquio Maize has been notified.  Jalene Mullet, [place] Coordinator [phone]

## 2018-10-25 NOTE — ED Notes (Signed)
Brother of pt called, I tried to explain that she is being [facility] and they can need come pick her up. Not sure if brother understood.
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
## 2018-11-25 ENCOUNTER — Ambulatory Visit: Payer: Medicaid Other | Admitting: Neurology

## 2018-11-25 ENCOUNTER — Telehealth: Payer: Self-pay | Admitting: *Deleted

## 2018-11-25 NOTE — Telephone Encounter (Signed)
Patient was no show for follow up with NP today.  

## 2018-11-25 NOTE — Progress Notes (Deleted)
PATIENT: Patricia [name] DOB: Feb 06, 1991  REASON FOR VISIT: follow up HISTORY FROM: patient  HISTORY OF PRESENT ILLNESS: Today 11/25/18  Patricia [name] is a 28 year old female with history of seizure disorder since age of 16.  She speaks Swahili.  Since last seen in October 2019, she was seen in the ER 12/08/2017 for seizure. She has been seen 7 times in the ER for psychiatric issues, including suicidal ideation.  Her most recent visit was October 24, 2018. HISTORY UPDATE 10/9/2019CM Patricia [name], 28 year old female returns for follow-up with a history of seizure disorder since the age of 52.  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  She also has a history of being cognitively slow.  Her seizure events are associated with dystonic posturing of the fingers in the hands and then fall with a generalized seizure event.  The seizure usually resolves in 2 to 3 minutes.  CT of the head performed 08/16/2017 was normal without acute intracranial process.  EEG performed 09/08/2017 This is an abnormal EEG recording secondary to right hemispheric slowing. This study suggests a right brain abnormality with a lowered seizure threshold. No electrographic seizures were recorded however.  The patient's brother states today that she has had approximately 10 seizures since her last visit here with Dr. Jannifer Franklin.  Her last one occurred last Thursday.  They have not called into the office to let us know that she was continuing with seizure events.  She returns for reevaluation  REVIEW OF SYSTEMS: Out of a complete 14 system review of symptoms, the patient complains only of the following symptoms, and all other reviewed systems are negative.  ALLERGIES: No Known Allergies  HOME MEDICATIONS: No outpatient medications prior to visit.   No facility-administered medications prior to visit.     PAST MEDICAL HISTORY: Past Medical History:  Diagnosis Date  . Cerebral palsy ([place])   . Hypertension   . Seizures ([place])    . Suicidal behavior     PAST SURGICAL HISTORY: No past surgical history on file.  FAMILY HISTORY: Family History  Problem Relation Age of Onset  . Hepatitis Brother     SOCIAL HISTORY: Social History   Socioeconomic History  . Marital status: Single    Spouse name: Not on file  . Number of children: 1  . Years of education: Not on file  . Highest education level: Not on file  Occupational History    Comment: none  Social Needs  . Financial resource strain: Not on file  . Food insecurity    Worry: Not on file    Inability: Not on file  . Transportation needs    Medical: Not on file    Non-medical: Not on file  Tobacco Use  . Smoking status: Never Smoker  Substance and Sexual Activity  . Alcohol use: Yes    Comment: occasional alcohol use  . Drug use: Never  . Sexual activity: Not on file  Lifestyle  . Physical activity    Days per week: Not on file    Minutes per session: Not on file  . Stress: Not on file  Relationships  . Social Herbalist on phone: Not on file    Gets together: Not on file    Attends religious service: Not on file    Active member of club or organization: Not on file    Attends meetings of clubs or organizations: Not on file    Relationship status: Not on file  .  Intimate partner violence    Fear of current or ex partner: Not on file    Emotionally abused: Not on file    Physically abused: Not on file    Forced sexual activity: Not on file  Other Topics Concern  . Not on file  Social History Narrative   ** Merged History Encounter **       ** Merged History Encounter **       Lives with mother, family      PHYSICAL EXAM  There were no vitals filed for this visit. There is no height or weight on file to calculate BMI.  Generalized: Well developed, in no acute distress   Neurological examination  Mentation: Alert oriented to time, place, history taking. Follows all commands speech and language fluent Cranial  nerve II-XII: Pupils were equal round reactive to light. Extraocular movements were full, visual field were full on confrontational test. Facial sensation and strength were normal. Uvula tongue midline. Head turning and shoulder shrug  were normal and symmetric. Motor: The motor testing reveals 5 over 5 strength of all 4 extremities. Good symmetric motor tone is noted throughout.  Sensory: Sensory testing is intact to soft touch on all 4 extremities. No evidence of extinction is noted.  Coordination: Cerebellar testing reveals good finger-nose-finger and heel-to-shin bilaterally.  Gait and station: Gait is normal. Tandem gait is normal. Romberg is negative. No drift is seen.  Reflexes: Deep tendon reflexes are symmetric and normal bilaterally.   DIAGNOSTIC DATA (LABS, IMAGING, TESTING) - I reviewed patient records, labs, notes, testing and imaging myself where available.  Lab Results  Component Value Date   WBC 6.8 10/24/2018   HGB 12.1 10/24/2018   HCT 38.5 10/24/2018   MCV 82.4 10/24/2018   PLT 246 10/24/2018      Component Value Date/Time   NA 138 10/24/2018 2140   NA 140 08/04/2017 1111   K 3.3 (L) 10/24/2018 2140   CL 110 10/24/2018 2140   CO2 18 (L) 10/24/2018 2140   GLUCOSE 97 10/24/2018 2140   BUN 17 10/24/2018 2140   BUN 8 08/04/2017 1111   CREATININE 1.14 (H) 10/24/2018 2140   CALCIUM 8.9 10/24/2018 2140   PROT 8.5 (H) 10/24/2018 2140   PROT 7.4 08/04/2017 1111   ALBUMIN 4.3 10/24/2018 2140   ALBUMIN 4.1 08/04/2017 1111   AST 16 10/24/2018 2140   ALT 14 10/24/2018 2140   ALKPHOS 58 10/24/2018 2140   BILITOT 0.5 10/24/2018 2140   BILITOT <0.2 08/04/2017 1111   GFRNONAA >60 10/24/2018 2140   GFRAA >60 10/24/2018 2140   No results found for: CHOL, HDL, LDLCALC, LDLDIRECT, TRIG, CHOLHDL No results found for: RAQT6A No results found for: VITAMINB12 Lab Results  Component Value Date   TSH 1.780 08/04/2017      ASSESSMENT AND PLAN 28 y.o. year old female  has a  past medical history of Cerebral palsy (HCC), Hypertension, Seizures (HCC), and Suicidal behavior. here with ***   I spent 15 minutes with the patient. 50% of this time was spent   Margie Ege, [place], DNP 11/25/2018, 7:32 AM [facility] [address], [address] [place], [place] [address] [phone]

## 2018-12-21 ENCOUNTER — Emergency Department (HOSPITAL_COMMUNITY): Payer: Medicaid Other

## 2018-12-21 ENCOUNTER — Emergency Department (HOSPITAL_COMMUNITY)
Admission: EM | Admit: 2018-12-21 | Discharge: 2018-12-23 | Disposition: A | Discharge status: Home / Self Care | Payer: Medicaid Other | Attending: Emergency Medicine | Admitting: Emergency Medicine

## 2018-12-21 ENCOUNTER — Encounter (HOSPITAL_COMMUNITY): Payer: Self-pay | Admitting: Radiology

## 2018-12-21 DIAGNOSIS — M25572 Pain in left ankle and joints of left foot: Secondary | ICD-10-CM | POA: Insufficient documentation

## 2018-12-21 DIAGNOSIS — Z23 Encounter for immunization: Secondary | ICD-10-CM | POA: Insufficient documentation

## 2018-12-21 DIAGNOSIS — Y92410 Unspecified street and highway as the place of occurrence of the external cause: Secondary | ICD-10-CM | POA: Diagnosis not present

## 2018-12-21 DIAGNOSIS — Y9389 Activity, other specified: Secondary | ICD-10-CM | POA: Insufficient documentation

## 2018-12-21 DIAGNOSIS — R519 Headache, unspecified: Secondary | ICD-10-CM | POA: Diagnosis not present

## 2018-12-21 DIAGNOSIS — R52 Pain, unspecified: Secondary | ICD-10-CM

## 2018-12-21 DIAGNOSIS — M545 Low back pain: Secondary | ICD-10-CM | POA: Insufficient documentation

## 2018-12-21 DIAGNOSIS — T1491XA Suicide attempt, initial encounter: Secondary | ICD-10-CM

## 2018-12-21 DIAGNOSIS — Y999 Unspecified external cause status: Secondary | ICD-10-CM | POA: Insufficient documentation

## 2018-12-21 DIAGNOSIS — S60512A Abrasion of left hand, initial encounter: Secondary | ICD-10-CM | POA: Diagnosis not present

## 2018-12-21 DIAGNOSIS — S8991XA Unspecified injury of right lower leg, initial encounter: Secondary | ICD-10-CM | POA: Diagnosis present

## 2018-12-21 DIAGNOSIS — S70311A Abrasion, right thigh, initial encounter: Secondary | ICD-10-CM | POA: Diagnosis not present

## 2018-12-21 DIAGNOSIS — S70212A Abrasion, left hip, initial encounter: Secondary | ICD-10-CM | POA: Insufficient documentation

## 2018-12-21 DIAGNOSIS — R109 Unspecified abdominal pain: Secondary | ICD-10-CM | POA: Diagnosis not present

## 2018-12-21 DIAGNOSIS — R079 Chest pain, unspecified: Secondary | ICD-10-CM | POA: Insufficient documentation

## 2018-12-21 DIAGNOSIS — S82141A Displaced bicondylar fracture of right tibia, initial encounter for closed fracture: Secondary | ICD-10-CM | POA: Diagnosis not present

## 2018-12-21 DIAGNOSIS — Z046 Encounter for general psychiatric examination, requested by authority: Secondary | ICD-10-CM | POA: Insufficient documentation

## 2018-12-21 DIAGNOSIS — M542 Cervicalgia: Secondary | ICD-10-CM | POA: Diagnosis not present

## 2018-12-21 DIAGNOSIS — F329 Major depressive disorder, single episode, unspecified: Secondary | ICD-10-CM | POA: Insufficient documentation

## 2018-12-21 DIAGNOSIS — X810XXA Intentional self-harm by jumping or lying in front of motor vehicle, initial encounter: Secondary | ICD-10-CM | POA: Insufficient documentation

## 2018-12-21 DIAGNOSIS — S90812A Abrasion, left foot, initial encounter: Secondary | ICD-10-CM | POA: Diagnosis not present

## 2018-12-21 DIAGNOSIS — Z20828 Contact with and (suspected) exposure to other viral communicable diseases: Secondary | ICD-10-CM | POA: Insufficient documentation

## 2018-12-21 DIAGNOSIS — S70312A Abrasion, left thigh, initial encounter: Secondary | ICD-10-CM | POA: Insufficient documentation

## 2018-12-21 LAB — I-STAT BETA HCG BLOOD, ED (MC, WL, AP ONLY): I-stat hCG, quantitative: <5 m[IU]/mL (ref <5)

## 2018-12-21 LAB — COMPREHENSIVE METABOLIC PANEL
ALT: 42 U/L (ref 0–44)
AST: 64 U/L — ABNORMAL HIGH (ref 15–41)
Albumin: 3.7 g/dL (ref 3.5–5.0)
Alkaline Phosphatase: 62 U/L (ref 38–126)
Anion gap: 12 (ref 5–15)
BUN: 17 mg/dL (ref 6–20)
CO2: 17 mmol/L — ABNORMAL LOW (ref 22–32)
Calcium: 8.8 mg/dL — ABNORMAL LOW (ref 8.9–10.3)
Chloride: 109 mmol/L (ref 98–111)
Creatinine, Ser: 1.29 mg/dL — ABNORMAL HIGH (ref 0.44–1.00)
GFR calc Af Amer: >60 mL/min (ref >60)
GFR calc non Af Amer: 57 mL/min — ABNORMAL LOW (ref >60)
Glucose, Bld: 99 mg/dL (ref 70–99)
Potassium: 3.5 mmol/L (ref 3.5–5.1)
Sodium: 138 mmol/L (ref 135–145)
Total Bilirubin: 0.6 mg/dL (ref 0.3–1.2)
Total Protein: 7.3 g/dL (ref 6.5–8.1)

## 2018-12-21 LAB — CBC
HCT: 38.9 % (ref 36.0–46.0)
Hemoglobin: 12.4 g/dL (ref 12.0–15.0)
MCH: 25.9 pg — ABNORMAL LOW (ref 26.0–34.0)
MCHC: 31.9 g/dL (ref 30.0–36.0)
MCV: 81.4 fL (ref 80.0–100.0)
Platelets: 237 10*3/uL (ref 150–400)
RBC: 4.78 MIL/uL (ref 3.87–5.11)
RDW: 13.7 % (ref 11.5–15.5)
WBC: 7.6 10*3/uL (ref 4.0–10.5)
nRBC: 0 % (ref 0.0–0.2)

## 2018-12-21 LAB — TYPE AND SCREEN
ABO/RH(D): A POS
Antibody Screen: NEGATIVE

## 2018-12-21 LAB — I-STAT CHEM 8, ED
BUN: 20 mg/dL (ref 6–20)
Calcium, Ion: 1.06 mmol/L — ABNORMAL LOW (ref 1.15–1.40)
Chloride: 109 mmol/L (ref 98–111)
Creatinine, Ser: 1.2 mg/dL — ABNORMAL HIGH (ref 0.44–1.00)
Glucose, Bld: 94 mg/dL (ref 70–99)
HCT: 38 % (ref 36.0–46.0)
Hemoglobin: 12.9 g/dL (ref 12.0–15.0)
Potassium: 3.5 mmol/L (ref 3.5–5.1)
Sodium: 141 mmol/L (ref 135–145)
TCO2: 17 mmol/L — ABNORMAL LOW (ref 22–32)

## 2018-12-21 LAB — CDS SEROLOGY

## 2018-12-21 LAB — ABO/RH: ABO/RH(D): A POS

## 2018-12-21 LAB — ETHANOL: Alcohol, Ethyl (B): <10 mg/dL (ref <10)

## 2018-12-21 LAB — PROTIME-INR
INR: 1.2 (ref 0.8–1.2)
Prothrombin Time: 14.8 seconds (ref 11.4–15.2)

## 2018-12-21 LAB — LACTIC ACID, PLASMA: Lactic Acid, Venous: 1.2 mmol/L (ref 0.5–1.9)

## 2018-12-21 MED ORDER — IOHEXOL 300 MG/ML  SOLN
100.0000 mL | Freq: Once | INTRAMUSCULAR | Status: AC | PRN
  Administered 2018-12-21: 100 mL via INTRAVENOUS

## 2018-12-21 MED ORDER — SODIUM CHLORIDE 0.9 % IV BOLUS
1000.0000 mL | Freq: Once | INTRAVENOUS | Status: AC
  Administered 2018-12-21: 1000 mL via INTRAVENOUS

## 2018-12-21 MED ORDER — ONDANSETRON HCL 4 MG/2ML IJ SOLN
4.0000 mg | Freq: Once | INTRAMUSCULAR | Status: AC
  Administered 2018-12-21: 4 mg via INTRAVENOUS
  Filled 2018-12-21: qty 2

## 2018-12-21 MED ORDER — SODIUM CHLORIDE 0.9 % IV SOLN
INTRAVENOUS | Status: AC | PRN
  Administered 2018-12-21: 1000 mL via INTRAVENOUS

## 2018-12-21 MED ORDER — TETANUS-DIPHTH-ACELL PERTUSSIS 5-2.5-18.5 LF-MCG/0.5 IM SUSP
0.5000 mL | Freq: Once | INTRAMUSCULAR | Status: AC
  Administered 2018-12-21: 0.5 mL via INTRAMUSCULAR
  Filled 2018-12-21: qty 0.5

## 2018-12-21 MED ORDER — FENTANYL CITRATE (PF) 100 MCG/2ML IJ SOLN
50.0000 ug | Freq: Once | INTRAMUSCULAR | Status: AC
  Administered 2018-12-21: 50 ug via INTRAVENOUS
  Filled 2018-12-21: qty 2

## 2018-12-21 MED ORDER — SODIUM CHLORIDE 0.9 % IV SOLN: INTRAVENOUS | Status: DC
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
## 2018-12-21 NOTE — ED Notes (Signed)
Pt back from CT
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
## 2018-12-21 NOTE — ED Notes (Signed)
Ipad interpretor now being used

## 2018-12-21 NOTE — ED Notes (Signed)
Provider completing IVC paperwork.

## 2018-12-21 NOTE — ED Notes (Signed)
Pt returned from CT w/ emesis on the left side of her face and on the bed.
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
## 2018-12-21 NOTE — ED Notes (Signed)
IVC paperwork served by Garment/textile technologist.  Copy faxed to [facility] and placed in medical recorders by Secretary.  RN placed original paperwork in red folder.

## 2018-12-21 NOTE — BH Assessment (Signed)
Clinician contacted pt's nurse, Vallery Ridge, in an effort to complete pt's [place] Assessment. Pt's nurse stated pt was just seen by Triage and that pt refused to speak to them. Pt's nurse requested to call back if pt appears to be more willing to communicate via the interpreter machine. Clinician provided her number.
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
## 2018-12-21 NOTE — ED Notes (Signed)
Patient transported to X-ray 

## 2018-12-22 ENCOUNTER — Emergency Department (HOSPITAL_COMMUNITY): Payer: Medicaid Other

## 2018-12-22 LAB — SARS CORONAVIRUS 2 BY RT PCR (HOSPITAL ORDER, PERFORMED IN ~~LOC~~ HOSPITAL LAB): SARS Coronavirus 2: NEGATIVE

## 2018-12-22 MED ORDER — ZOLPIDEM TARTRATE 5 MG PO TABS: 5.0000 mg | ORAL_TABLET | Freq: Every evening | ORAL | Status: DC | PRN

## 2018-12-22 MED ORDER — LEVETIRACETAM 500 MG PO TABS
500.0000 mg | ORAL_TABLET | Freq: Two times a day (BID) | ORAL | Status: DC
  Administered 2018-12-22 – 2018-12-23 (×4): 500 mg via ORAL
  Filled 2018-12-22 (×5): qty 1

## 2018-12-22 MED ORDER — ONDANSETRON HCL 4 MG PO TABS: 4.0000 mg | ORAL_TABLET | Freq: Three times a day (TID) | ORAL | Status: DC | PRN

## 2018-12-22 MED ORDER — ALUM & MAG HYDROXIDE-SIMETH 200-200-20 MG/5ML PO SUSP: 30.0000 mL | Freq: Four times a day (QID) | ORAL | Status: DC | PRN

## 2018-12-22 MED ORDER — RISPERIDONE 0.5 MG PO TABS
0.5000 mg | ORAL_TABLET | Freq: Two times a day (BID) | ORAL | Status: DC
  Administered 2018-12-22 – 2018-12-23 (×3): 0.5 mg via ORAL
  Filled 2018-12-22 (×5): qty 1

## 2018-12-22 MED ORDER — ACETAMINOPHEN 325 MG PO TABS
650.0000 mg | ORAL_TABLET | ORAL | Status: DC | PRN
  Administered 2018-12-22: 650 mg via ORAL
  Filled 2018-12-22: qty 2
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
## 2018-12-22 NOTE — Progress Notes (Signed)
Orthopedic Tech Progress Note Patient Details:  Patricia [name] 06/11/1990 [id number]  Ortho Devices Type of Ortho Device: ASO Ortho Device/Splint Location: lle Ortho Device/Splint Interventions: Ordered, Application, Adjustment   Post Interventions Patient Tolerated: Well Instructions Provided: Care of device, Adjustment of device   Karolee Stamps 12/22/2018, 3:29 AM
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
## 2018-12-22 NOTE — Progress Notes (Signed)
CSW spoke with pt's Refugee Resettlement caseworker, Fari [phone]) to gain collateral information. Fari states that she has been working with pt's mother to attempt to help her gain legal guardianship of pt. Pt's mother works long hours and it has been difficult to move forward with the process.    Audree Camel, LCSW, [place] Disposition CSW [facility] BHH/TTS [phone]   CSW discussed case with TTS counselor Lorenza Cambridge, LPC and Dr. Dwyane Dee, [facility] medical director. It was decided that pt would be psychiatrically cleared. CSW contacted Fari with Refugee Resettlement and informed her of disposition. She shared that there are at least 10 family members living in the home currently and there is no one who speaks English to translate. Transportation can be an issue as no one in the home drives except pt's mother. One older brother, Onetta Spainhower [phone]), is able to transport pt but is currently at work. CSW left a HIPAA compliant message with pt's brother.

## 2018-12-22 NOTE — ED Notes (Addendum)
Ortho tech called in reference to ASO splint.

## 2018-12-22 NOTE — BHH Counselor (Signed)
Clinician spoke to Dorothea Ogle, RN for an update on the pt (if she was ready to be assessed.) Dorothea Ogle, RN to return clinician's call. Clinician provided contact number.    Vertell Novak, [place], [facility], [facility] Triage Specialist [phone] or [phone].

## 2018-12-23 ENCOUNTER — Other Ambulatory Visit: Payer: Self-pay

## 2018-12-23 ENCOUNTER — Encounter (HOSPITAL_COMMUNITY): Payer: Self-pay

## 2018-12-23 NOTE — ED Notes (Signed)
No belongings found for the patient.

## 2018-12-23 NOTE — Discharge Instructions (Addendum)
Follow-up as instructed by behavioral health 

## 2018-12-23 NOTE — ED Notes (Addendum)
Patient verbalized understanding of discharge instructions. Opportunities for questioning and answers were provided. Armband removed by staff. Patient removed from ED.   

## 2018-12-23 NOTE — ED Notes (Signed)
Breakfast ordered 

## 2018-12-23 NOTE — Progress Notes (Addendum)
CSW left HIPAA compliant voice message with pt's [place] Resettlement caseworker, Devra Dopp in an attempt to find assistance in getting pt home from [facility] ED.  CSW spoke with [facility] @ [facility] ED and discussed the possibility of [employer] taking pt home. She will look into this.   Audree Camel, LCSW, LCAS Disposition CSW [facility] BHH/TTS [phone]   UPDATE: Received phone call from pt's brother, [facility], who stated that they have been unable to find transportation to get pt home. Emmy at [facility] ED was notified and will have the ED CSW arrange for a cab/LYFT to take pt home where her family are expecting her.

## 2019-03-18 ENCOUNTER — Emergency Department (HOSPITAL_COMMUNITY)
Admission: EM | Admit: 2019-03-18 | Discharge: 2019-03-18 | Disposition: A | Discharge status: Home / Self Care | Payer: Medicaid Other | Attending: Emergency Medicine | Admitting: Emergency Medicine

## 2019-03-18 ENCOUNTER — Encounter (HOSPITAL_COMMUNITY): Payer: Self-pay | Admitting: Emergency Medicine

## 2019-03-18 ENCOUNTER — Other Ambulatory Visit: Payer: Self-pay

## 2019-03-18 DIAGNOSIS — I10 Essential (primary) hypertension: Secondary | ICD-10-CM | POA: Insufficient documentation

## 2019-03-18 DIAGNOSIS — S01511A Laceration without foreign body of lip, initial encounter: Secondary | ICD-10-CM | POA: Diagnosis present

## 2019-03-18 DIAGNOSIS — Y939 Activity, unspecified: Secondary | ICD-10-CM | POA: Diagnosis not present

## 2019-03-18 DIAGNOSIS — Y92009 Unspecified place in unspecified non-institutional (private) residence as the place of occurrence of the external cause: Secondary | ICD-10-CM

## 2019-03-18 DIAGNOSIS — Y999 Unspecified external cause status: Secondary | ICD-10-CM | POA: Insufficient documentation

## 2019-03-18 DIAGNOSIS — Y929 Unspecified place or not applicable: Secondary | ICD-10-CM | POA: Diagnosis not present

## 2019-03-18 DIAGNOSIS — W19XXXA Unspecified fall, initial encounter: Secondary | ICD-10-CM | POA: Insufficient documentation

## 2019-03-18 DIAGNOSIS — R569 Unspecified convulsions: Secondary | ICD-10-CM | POA: Insufficient documentation

## 2019-03-18 MED ORDER — LIDOCAINE HCL 2 % IJ SOLN
20.0000 mL | Freq: Once | INTRAMUSCULAR | Status: AC
  Administered 2019-03-18: 17:00:00 400 mg via INTRADERMAL
  Filled 2019-03-18: qty 20

## 2019-03-18 NOTE — ED Notes (Signed)
Attempting to call family members listed in chart without answer.

## 2019-03-18 NOTE — ED Notes (Signed)
Per interpreter [phone] patient reports seizure causing fall today. Denies prescribed seizure medication.

## 2019-03-18 NOTE — ED Provider Notes (Addendum)
[place] DEPT Provider Note   CSN: [id number] Arrival date & time: 03/18/19  1437     History Chief Complaint  Patient presents with  . Fall  . Seizures    Patricia [name] is a 29 y.o. female with history of CP, seizures who presents with a fall and lip laceration. Pt is a difficult historian due to language barrier and mental disorder. She states that she fell today. She cannot tell me how she fell just that she fell and she felt dizzy and sick prior to falling. She has a hx of seizures and states she has seizures and falls often. She cannot elaborate on any more symptoms. She states she doesn't take seizure medication. It appears she is non-compliant and has had several no shows with neurology as an outpatient. She is repeatedly asking to go home.  LEVEL 5 caveat due to psych  HPI     Past Medical History:  Diagnosis Date  . Cerebral palsy ([place])   . Hypertension   . Seizures ([place])   . Suicidal behavior     Patient Active Problem List   Diagnosis Date Noted  . Post-ictal state ([place])   . Post-ictal confusion 08/16/2017  . 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 Refugee health examination 07/16/2017  . Language barrier 07/16/2017  . Seizure disorder ([place]) 07/16/2017  . Hepatitis B 07/16/2017  . 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 No pertinent surgical history.   OB History   No obstetric history on file.     Family History  Problem Relation Age of Onset  . Hepatitis Brother     Social History   Tobacco Use  . Smoking status: Never Smoker  Substance Use Topics  . Alcohol use: Yes    Comment: occasional alcohol use  . Drug use: Never    Home Medications Prior to Admission medications   Not on File    Allergies    Patient has no known allergies.  Review of Systems   Review of Systems  Unable to perform ROS: Psychiatric disorder    Physical Exam Updated  Vital Signs BP 117/61   Pulse 72   Temp 98.4 F (36.9 C)   Resp 17   SpO2 100%   Physical Exam Vitals and nursing note reviewed.  Constitutional:      General: She is not in acute distress.    Appearance: She is well-developed. She is obese. She is not ill-appearing.  HENT:     Head: Normocephalic and atraumatic.     Mouth/Throat:     Comments: 2cm lip laceration over the right lower lip which crosses the vermillion border by about 76mm Eyes:     General: No scleral icterus.       Right eye: No discharge.        Left eye: No discharge.     Conjunctiva/sclera: Conjunctivae normal.     Pupils: Pupils are equal, round, and reactive to light.  Cardiovascular:     Rate and Rhythm: Normal rate.  Pulmonary:     Effort: Pulmonary effort is normal. No respiratory distress.  Abdominal:     General: There is no distension.  Musculoskeletal:     Cervical back: Normal range of motion.  Skin:    General: Skin is warm and dry.  Neurological:     Mental Status: She is alert and oriented to person, place, and time.  Psychiatric:  Behavior: Behavior normal.     ED Results / Procedures / Treatments   Labs (all labs ordered are listed, but only abnormal results are displayed) Labs Reviewed - No data to display  EKG None  Radiology No results found.  Procedures .Marland KitchenLaceration Repair  Date/Time: 03/18/2019 4:48 PM Performed by: Bethel Born, PA-C Authorized by: Bethel Born, PA-C   Consent:    Consent obtained:  Verbal   Consent given by:  Patient   Risks discussed:  Infection and pain   Alternatives discussed:  No treatment Anesthesia (see MAR for exact dosages):    Anesthesia method:  Local infiltration   Local anesthetic:  Lidocaine 2% w/o epi Laceration details:    Location:  Lip   Lip location:  Lower exterior lip   Length (cm):  2   Depth (mm):  5 Repair type:    Repair type:  Intermediate Pre-procedure details:    Preparation:  Patient was  prepped and draped in usual sterile fashion Exploration:    Wound exploration: wound explored through full range of motion and entire depth of wound probed and visualized   Treatment:    Area cleansed with:  Soap and water   Amount of cleaning:  Standard   Irrigation method:  Tap   Visualized foreign bodies/material removed: no   Skin repair:    Repair method:  Sutures   Suture size:  5-0   Wound skin closure material used: vicryl rapide.   Suture technique:  Simple interrupted   Number of sutures:  3 Approximation:    Approximation:  Close Post-procedure details:    Dressing:  Open (no dressing)   Patient tolerance of procedure:  Tolerated well, no immediate complications   (including critical care time)    Medications Ordered in ED Medications  lidocaine (XYLOCAINE) 2 % (with pres) injection 400 mg (has no administration in time range)    ED Course  I have reviewed the triage vital signs and the nursing notes.  Pertinent labs & imaging results that were available during my care of the patient were reviewed by me and considered in my medical decision making (see chart for details).  29 year old female with reported history of seizures presents with a fall and lip laceration.  History is limited due to patient's psychiatric issues and language barrier.  Her vitals are normal.  Her neurologic exam is grossly normal.  She states that she has seizures and falls often I do not feel like she needs labs or imaging today.  She is alert and responsive.  Lip laceration was repaired without any difficulty.  3 nonabsorbable sutures were placed.  Family was contacted to pick the patient up from the ED.  MDM Rules/Calculators/A&P                       Final Clinical Impression(s) / ED Diagnoses Final diagnoses:  Fall in home, initial encounter  Lip laceration, initial encounter    Rx / DC Orders ED Discharge Orders    None       Bethel Born, PA-C 03/19/19 1526      Bethel Born, PA-C 03/19/19 1528    Melene Plan, DO 03/23/19 1458

## 2019-03-18 NOTE — ED Notes (Signed)
Spoke with patient's brother who reports he will call back after trying to find someone to provide ride for patient.

## 2019-03-18 NOTE — ED Notes (Signed)
Patient's family friend reports she will provide ride for patient in approximately thirty minutes.

## 2019-03-18 NOTE — ED Notes (Signed)
Patient reports she does not know the number of family members. Will continue to attempt to call.

## 2019-03-18 NOTE — ED Triage Notes (Signed)
Per EMS, patient is swahili speaking, family on scene reports patient fell and has laceration to lower lip. Patient sitting on couch upon EMS arrival. Possible dizziness prior to fall. Ambulatory. C-collar in place.

## 2019-03-18 NOTE — Discharge Instructions (Signed)
Keep area clean and dry by washing with soap and water daily Watch for signs of infection (redness, drainage, worsening pain) Take Tylenol or Ibuprofen for pain as needed The stitches are dissolvable and do not need to be removed

## 2019-03-24 ENCOUNTER — Encounter (HOSPITAL_COMMUNITY): Payer: Self-pay | Admitting: Emergency Medicine

## 2019-03-24 ENCOUNTER — Emergency Department (HOSPITAL_COMMUNITY)
Admission: EM | Admit: 2019-03-24 | Discharge: 2019-03-25 | Disposition: A | Discharge status: Home / Self Care | Payer: Medicaid Other | Attending: Emergency Medicine | Admitting: Emergency Medicine

## 2019-03-24 DIAGNOSIS — I10 Essential (primary) hypertension: Secondary | ICD-10-CM | POA: Diagnosis not present

## 2019-03-24 DIAGNOSIS — R569 Unspecified convulsions: Secondary | ICD-10-CM | POA: Insufficient documentation

## 2019-03-24 MED ORDER — LEVETIRACETAM 500 MG PO TABS: 500.0000 mg | ORAL_TABLET | Freq: Two times a day (BID) | ORAL | 0 refills | Status: DC

## 2019-03-24 MED ORDER — LEVETIRACETAM 500 MG PO TABS: 1500.0000 mg | ORAL_TABLET | Freq: Once | ORAL | Status: DC

## 2019-03-24 MED ORDER — LEVETIRACETAM IN NACL 1000 MG/100ML IV SOLN
1000.0000 mg | Freq: Once | INTRAVENOUS | Status: AC
  Administered 2019-03-24: 17:00:00 1000 mg via INTRAVENOUS
  Filled 2019-03-24: qty 100

## 2019-03-24 NOTE — ED Notes (Signed)
Texted all 5 numbers listed as patient contacts utilizing the following sentence: "Palmina needs to be picked up from the Emergency Department at [facility]. We have been trying to reach you for hours." I utilized google translate and texted this in Swahili followed by a text of this sentence in english.  This text was sent to the following 5 numbers: [phone], [phone], [phone], [phone], [phone]. Per report they have attempted to reach family for hours and charge RN is aware of the situation.

## 2019-03-24 NOTE — Discharge Instructions (Signed)
Patricia [name] una shida ya [place] na una mshtuko unach[place] kama mshtuko wa mafanikio. Hii [place]. Kwa muda mrefu kama utarudi kwa msingi basi kwa [place]at[place] ufuatilie na daktari wako wa neva ofisini. Tuli[place] kwamba hautumii dawa zako. Ina[place] kwamba uko kwenye Keppra na kwa hivyo nitakuwakilisha. Tafadhali jadili hii na daktari wako wa neva. Haupaswi kuoga na yeye mwenyewe kuendesha gari au kuogelea bila mtu mwingine hapo.

## 2019-03-24 NOTE — ED Notes (Signed)
Attempting to call patient's contacts to provide ride for patient at time of discharge with no answer.

## 2019-03-24 NOTE — ED Notes (Signed)
Spoke with patient's brother who reports he is at work but will call another family member to provide transport.

## 2019-03-24 NOTE — ED Notes (Signed)
Patient incontinent of urine. Provided patient with pants, soap and towels. Used interpreter to explain to patient materials are used to clean herself up.

## 2019-03-24 NOTE — ED Notes (Signed)
Patient resting quietly with eyes closed. Equal chest rise and fall noted.

## 2019-03-24 NOTE — ED Notes (Signed)
Called all 4 numbers on patient's contact list-left messages informing family patient has been discharged and needs to be picked up

## 2019-03-24 NOTE — ED Notes (Signed)
Upon arrival to treatment room, patient alert but unresponsive to questions asked using interpreter. Patient spitting in bed and provided with emesis bag. Approximately five minutes later, patient was responsive and stating name when asked. Dr. Adela Lank called to bedside.

## 2019-03-24 NOTE — ED Provider Notes (Signed)
[place] DEPT Provider Note   CSN: [id number] Arrival date & time: 03/24/19  1601     History Chief Complaint  Patient presents with  . Seizures    Patricia [name] is a 29 y.o. female.  29 yo F with a chief complaint of a seizure.  Happened just prior to arrival.  Patient is unable to describe it for me.  Denies any injury.  Does not provide much further history.  History is limited due to the patient speaking Swahili and having a learning disability that is not fully diagnosed.  The history is provided by the patient. The history is limited by a language barrier. A language interpreter was used.  Seizures Seizure activity on arrival: no   Illness Severity:  Moderate Onset quality:  Gradual Duration:  1 day Timing:  Constant Progression:  Unchanged Chronicity:  New Associated symptoms: no chest pain, no congestion, no fever, no headaches, no myalgias, no nausea, no rhinorrhea, no shortness of breath, no vomiting and no wheezing        Past Medical History:  Diagnosis Date  . Cerebral palsy ([place])   . Hypertension   . Seizures ([place])   . Suicidal behavior     Patient Active Problem List   Diagnosis Date Noted  . Post-ictal state ([place])   . Post-ictal confusion 08/16/2017  . 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 Refugee health examination 07/16/2017  . Language barrier 07/16/2017  . Seizure disorder ([place]) 07/16/2017  . Hepatitis B 07/16/2017  . 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 No pertinent surgical history.   OB History   No obstetric history on file.     Family History  Problem Relation Age of Onset  . Hepatitis Brother     Social History   Tobacco Use  . Smoking status: Never Smoker  Substance Use Topics  . Alcohol use: Yes    Comment: occasional alcohol use  . Drug use: Never    Home Medications Prior to Admission medications   Medication Sig  Start Date End Date Taking? Authorizing Provider  levETIRAcetam (KEPPRA) 500 MG tablet Take 1 tablet (500 mg total) by mouth 2 (two) times daily. 03/24/19   Deno Etienne, DO    Allergies    Patient has no known allergies.  Review of Systems   Review of Systems  Constitutional: Negative for chills and fever.  HENT: Negative for congestion and rhinorrhea.   Eyes: Negative for redness and visual disturbance.  Respiratory: Negative for shortness of breath and wheezing.   Cardiovascular: Negative for chest pain and palpitations.  Gastrointestinal: Negative for nausea and vomiting.  Genitourinary: Negative for dysuria and urgency.  Musculoskeletal: Negative for arthralgias and myalgias.  Skin: Negative for pallor and wound.  Neurological: Positive for seizures. Negative for dizziness and headaches.    Physical Exam Updated Vital Signs BP 110/89   Pulse 93   Temp 98.7 F (37.1 C) (Oral)   Resp (!) 24   SpO2 100%   Physical Exam Vitals and nursing note reviewed.  Constitutional:      General: She is not in acute distress.    Appearance: She is well-developed. She is not diaphoretic.  HENT:     Head: Normocephalic and atraumatic.  Eyes:     Pupils: Pupils are equal, round, and reactive to light.  Cardiovascular:     Rate and Rhythm: Normal rate and regular rhythm.     Heart sounds:  No murmur. No friction rub. No gallop.   Pulmonary:     Effort: Pulmonary effort is normal.     Breath sounds: No wheezing or rales.  Abdominal:     General: There is no distension.     Palpations: Abdomen is soft.     Tenderness: There is no abdominal tenderness.  Musculoskeletal:        General: No tenderness.     Cervical back: Normal range of motion and neck supple.     Comments: Patient was palpated from head to toe without any obvious areas of injury.  Skin:    General: Skin is warm and dry.  Neurological:     Mental Status: She is alert.     Comments: Alert and interactive does not  answer most of the questions that are asked of her.     ED Results / Procedures / Treatments   Labs (all labs ordered are listed, but only abnormal results are displayed) Labs Reviewed - No data to display  EKG None  Radiology No results found.  Procedures Procedures (including critical care time)  Medications Ordered in ED Medications  levETIRAcetam (KEPPRA) IVPB 1000 mg/100 mL premix (0 mg Intravenous Stopped 03/24/19 1725)    ED Course  I have reviewed the triage vital signs and the nursing notes.  Pertinent labs & imaging results that were available during my care of the patient were reviewed by me and considered in my medical decision making (see chart for details).    MDM Rules/Calculators/A&P                      29 yo F with a chief complaint of possible seizure.  Patient has been diagnosed with a seizure disorder and and her most recent neurology note was on Keppra.  Per the family the patient is not on any medications for her seizures.  We will load her with Keppra.  Prescribe her the same.  Have her follow-up with her neurologist.  6:15 PM:  I have discussed the diagnosis/risks/treatment options with the patient and believe the pt to be eligible for discharge home to follow-up with PCP. We also discussed returning to the ED immediately if new or worsening sx occur. We discussed the sx which are most concerning (e.g., recurrent seizures, persistent confusion) that necessitate immediate return. Medications administered to the patient during their visit and any new prescriptions provided to the patient are listed below.  Medications given during this visit Medications  levETIRAcetam (KEPPRA) IVPB 1000 mg/100 mL premix (0 mg Intravenous Stopped 03/24/19 1725)     The patient appears reasonably screen and/or stabilized for discharge and I doubt any other medical condition or other [facility] requiring further screening, evaluation, or treatment in the ED at this time prior to  discharge.   Final Clinical Impression(s) / ED Diagnoses Final diagnoses:  Seizure [facility])    Rx / DC Orders ED Discharge Orders         Ordered    levETIRAcetam (KEPPRA) 500 MG tablet  2 times daily     03/24/19 1743           Melene Plan, DO 03/24/19 1815

## 2019-03-24 NOTE — ED Notes (Signed)
NT called this RN into room. Pt had jerking movements and HR elevated to 180. Pt stopped jerking movements and HR decreased to normal range. Pt appeared to be post-ictal and would not respond to questions.

## 2019-03-24 NOTE — ED Notes (Signed)
Attempted to call patient's brother regarding expected time of transport with no answer.

## 2019-03-24 NOTE — ED Triage Notes (Signed)
Per EMS, family reports seizure today. Ambulatory and alert upon EMS arrival. Hx autism. Speaks swahili. Seen on 1/22 after seizure. Family reports patient is not prescribed medicine.  20g L AC

## 2019-03-24 NOTE — ED Notes (Signed)
Attempted to call patient contacts with no answer.

## 2019-03-24 NOTE — ED Notes (Signed)
Charge RN made aware patient's family is not responding.

## 2019-03-24 NOTE — ED Notes (Signed)
Attempted to call all numbers on contact list with answer.

## 2019-03-24 NOTE — ED Notes (Signed)
Patient given meal tray.

## 2019-03-24 NOTE — ED Notes (Signed)
Patient assisted with bathing and dressing by NT and phlebotomist. Awaiting patient transport at this time.

## 2019-03-25 NOTE — ED Notes (Signed)
Ashok Croon a Child psychotherapist for [employer] who is working with the pt father and trying to help the family arrived to pick pt up and take her home to her family. I went over d/c paperwork with her and she voiced understanding that pt needs follow up with neurology and that there is a prescription that needs to be picked up that pt needs to take as prescribed and we discussed where the pharmacy is that pt needs to take this med as prescribed.  Helped pt out to car, no distress

## 2019-03-25 NOTE — ED Notes (Signed)
Call to [employer] for home visit to notify family of pt discharge. Pt mother and sister at home verbalized did not understand they need to pick up family member and currently had no transportation to do so.Copy further states that he believes d/t cultural difference that pt family did not understand there responsible for picking up the pt. Sister stated that they had no way of picking up Patricia. Patricia [name] but her brother would be getting of work at 0400 and would be picking her up at that time.  Initially a taxi was proposed to take  Patricia [name] home but after talking with her using the interpreter Nadeen Landau  It was determine that she was not safe using this method w/o escort.

## 2019-03-25 NOTE — ED Notes (Signed)
Charge RN has contacted [employer] to check pt residence and alert family members that pt has been abandoned here and needs to be picked up.  This was done about 90 minutes ago.  Await call back.

## 2019-03-30 ENCOUNTER — Emergency Department (HOSPITAL_COMMUNITY)
Admission: EM | Admit: 2019-03-30 | Discharge: 2019-03-31 | Disposition: A | Discharge status: Home / Self Care | Payer: Medicaid Other | Attending: Emergency Medicine | Admitting: Emergency Medicine

## 2019-03-30 ENCOUNTER — Encounter (HOSPITAL_COMMUNITY): Payer: Self-pay | Admitting: Emergency Medicine

## 2019-03-30 ENCOUNTER — Other Ambulatory Visit: Payer: Self-pay

## 2019-03-30 DIAGNOSIS — F4325 Adjustment disorder with mixed disturbance of emotions and conduct: Secondary | ICD-10-CM | POA: Diagnosis not present

## 2019-03-30 DIAGNOSIS — I10 Essential (primary) hypertension: Secondary | ICD-10-CM | POA: Insufficient documentation

## 2019-03-30 DIAGNOSIS — R45851 Suicidal ideations: Secondary | ICD-10-CM | POA: Diagnosis not present

## 2019-03-30 DIAGNOSIS — Z20822 Contact with and (suspected) exposure to covid-19: Secondary | ICD-10-CM | POA: Diagnosis not present

## 2019-03-30 DIAGNOSIS — F329 Major depressive disorder, single episode, unspecified: Secondary | ICD-10-CM | POA: Diagnosis present

## 2019-03-30 DIAGNOSIS — Z046 Encounter for general psychiatric examination, requested by authority: Secondary | ICD-10-CM | POA: Diagnosis not present

## 2019-03-30 DIAGNOSIS — F79 Unspecified intellectual disabilities: Secondary | ICD-10-CM | POA: Insufficient documentation

## 2019-03-30 DIAGNOSIS — G809 Cerebral palsy, unspecified: Secondary | ICD-10-CM | POA: Diagnosis not present

## 2019-03-30 MED ORDER — LORAZEPAM 2 MG/ML IJ SOLN
2.0000 mg | Freq: Once | INTRAMUSCULAR | Status: AC
  Administered 2019-03-30: 2 mg via INTRAMUSCULAR
  Filled 2019-03-30: qty 1

## 2019-03-30 MED ORDER — DIPHENHYDRAMINE HCL 50 MG/ML IJ SOLN
50.0000 mg | Freq: Once | INTRAMUSCULAR | Status: AC
  Administered 2019-03-30: 50 mg via INTRAMUSCULAR
  Filled 2019-03-30: qty 1

## 2019-03-30 MED ORDER — ZIPRASIDONE MESYLATE 20 MG IM SOLR
20.0000 mg | Freq: Once | INTRAMUSCULAR | Status: AC
  Administered 2019-03-31: 20 mg via INTRAMUSCULAR
  Filled 2019-03-30: qty 20

## 2019-03-30 NOTE — ED Notes (Signed)
Pt standing in triage hallway naked This writer put a gown around the pt and placed her in Triage 5 [employer] and security watching pt
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
## 2019-03-30 NOTE — ED Notes (Signed)
Pt is naked again in triage room

## 2019-03-30 NOTE — ED Notes (Addendum)
Per Mia "ok" to hold off on labs until pt is medicated and calm
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
## 2019-03-30 NOTE — ED Notes (Signed)
Pt stood in middle of the room and urinated on the floor This writer cleaned up pt to the best of my ability Pt then took off her shirt and sat naked in the chair This Clinical research associate and Dee/NT re-dressed the pt Pt is currently crying and speaking in [place]

## 2019-03-31 LAB — SALICYLATE LEVEL: Salicylate Lvl: <7 mg/dL — ABNORMAL LOW (ref 7.0–30.0)

## 2019-03-31 LAB — COMPREHENSIVE METABOLIC PANEL
ALT: 15 U/L (ref 0–44)
AST: 17 U/L (ref 15–41)
Albumin: 4.2 g/dL (ref 3.5–5.0)
Alkaline Phosphatase: 68 U/L (ref 38–126)
Anion gap: 12 (ref 5–15)
BUN: 12 mg/dL (ref 6–20)
CO2: 18 mmol/L — ABNORMAL LOW (ref 22–32)
Calcium: 9.3 mg/dL (ref 8.9–10.3)
Chloride: 109 mmol/L (ref 98–111)
Creatinine, Ser: 0.93 mg/dL (ref 0.44–1.00)
GFR calc Af Amer: >60 mL/min (ref >60)
GFR calc non Af Amer: >60 mL/min (ref >60)
Glucose, Bld: 87 mg/dL (ref 70–99)
Potassium: 3.5 mmol/L (ref 3.5–5.1)
Sodium: 139 mmol/L (ref 135–145)
Total Bilirubin: 0.9 mg/dL (ref 0.3–1.2)
Total Protein: 8.2 g/dL — ABNORMAL HIGH (ref 6.5–8.1)

## 2019-03-31 LAB — I-STAT BETA HCG BLOOD, ED (MC, WL, AP ONLY): I-stat hCG, quantitative: <5 m[IU]/mL (ref <5)

## 2019-03-31 LAB — ETHANOL: Alcohol, Ethyl (B): <10 mg/dL (ref <10)

## 2019-03-31 LAB — RESPIRATORY PANEL BY RT PCR (FLU A&B, COVID)
Influenza A by PCR: NEGATIVE
Influenza B by PCR: NEGATIVE
SARS Coronavirus 2 by RT PCR: NEGATIVE

## 2019-03-31 LAB — CBC
HCT: 40.8 % (ref 36.0–46.0)
Hemoglobin: 12.9 g/dL (ref 12.0–15.0)
MCH: 25.8 pg — ABNORMAL LOW (ref 26.0–34.0)
MCHC: 31.6 g/dL (ref 30.0–36.0)
MCV: 81.6 fL (ref 80.0–100.0)
Platelets: 228 10*3/uL (ref 150–400)
RBC: 5 MIL/uL (ref 3.87–5.11)
RDW: 14.6 % (ref 11.5–15.5)
WBC: 7.3 10*3/uL (ref 4.0–10.5)
nRBC: 0 % (ref 0.0–0.2)

## 2019-03-31 LAB — ACETAMINOPHEN LEVEL: Acetaminophen (Tylenol), Serum: <10 ug/mL — ABNORMAL LOW (ref 10–30)

## 2019-03-31 MED ORDER — LEVETIRACETAM 500 MG PO TABS: 500.0000 mg | ORAL_TABLET | Freq: Two times a day (BID) | ORAL | Status: DC

## 2019-03-31 MED ORDER — STERILE WATER FOR INJECTION IJ SOLN
INTRAMUSCULAR | Status: AC
  Filled 2019-03-31: qty 10

## 2019-03-31 NOTE — Progress Notes (Signed)
Received Patricia [name] from the main ED with security, [employer],  nurse and the technician. She was escorted to her room and immediately went to bed. The sitter is at the bedside. She slept throughout the remainder of the morning.
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
## 2019-03-31 NOTE — BH Assessment (Signed)
BHH Assessment Progress Note  Per Berneice Heinrich, FNP, this pt does not require psychiatric hospitalization at this time.  Pt presents under IVC initiated by EDP Geoffery Lyons, MD, which has been rescinded by Nelly Rout, MD.  Pt is to be discharged from [facility] with recommendation to follow up with [facility].  This has been included in pt's discharge instructions.  Pt's nurse has been notified.  Doylene Canning, MA Triage Specialist [phone]

## 2019-03-31 NOTE — ED Notes (Signed)
Pt resistant to care refusing all care medicated per order

## 2019-03-31 NOTE — ED Notes (Signed)
Pt dc'd in stable  Condition.  Voucher for taxi ride home with [employer].

## 2019-03-31 NOTE — ED Notes (Signed)
Pt continues to refuse all care medicated per order

## 2019-03-31 NOTE — Discharge Instructions (Addendum)
Suicidal Feelings: How to Help Yourself Suicide is when you end your own life. There are many things you can do to help yourself feel better when struggling with these feelings. Many services and people are available to support you and others who struggle with similar feelings.  If you ever feel like you may hurt yourself or others, or have thoughts about taking your own life, get help right away. To get help:  Call your local emergency services (911 in the [place]  The [place] [address]'s health and human services helpline (211 in the [place]  Go to your nearest emergency department.  Call a suicide hotline to speak with a trained counselor. The following suicide hotlines are available in the [place]: ? 1-800-273-TALK (339)498-4416). ? 1-800-SUICIDE (702) 333-9392). ? 256-515-6485. This is a hotline for Spanish speakers. ? 734-641-6526. This is a hotline for TTY users. ? 1-866-4-U-TREVOR 779-808-9733). This is a hotline for lesbian, gay, bisexual, transgender, or questioning youth. ? For a list of hotlines in [place], visit ItCheaper.dk.html  Contact a crisis center or a local suicide prevention center. To find a crisis center or suicide prevention center: ? Call your local hospital, clinic, community service organization, mental health center, social service provider, or health department. Ask for help with connecting to a crisis center. ? For a list of crisis centers in the [place], visit: suicidepreventionlifeline.org ? For a list of crisis centers in [place], visit: suicideprevention.ca How to help yourself feel better   Promise yourself that you will not do anything extreme when you have suicidal feelings. Remember, there is hope. Many people have gotten through suicidal thoughts and feelings, and you can too. If you have had these feelings before, remind yourself that you can get through them again.  Let family,  friends, teachers, or counselors know how you are feeling. Try not to separate yourself from those who care about you and want to help you. Talk with someone every day, even if you do not feel sociable. Face-to-face conversation is best to help them understand your feelings.  Contact a mental health care provider and work with this person regularly.  Make a safety plan that you can follow during a crisis. Include phone numbers of suicide prevention hotlines, mental health professionals, and trusted friends and family members you can call during an emergency. Save these numbers on your phone.  If you are thinking of taking a lot of medicine, give your medicine to someone who can give it to you as prescribed. If you are on antidepressants and are concerned you will overdose, tell your health care provider so that he or she can give you safer medicines.  Try to stick to your routines. Follow a schedule every day. Make self-care a priority.  Make a list of realistic goals, and cross them off when you achieve them. Accomplishments can give you a sense of worth.  Wait until you are feeling better before doing things that you find difficult or unpleasant.  Do things that you have always enjoyed to take your mind off your feelings. Try reading a book, or listening to or playing music. Spending time outside, in nature, may help you feel better. Follow these instructions at home:   Visit your primary health care provider every year for a checkup.  Work with a mental health care provider as needed.  Eat a well-balanced diet, and eat regular meals.  Get plenty of rest.  Exercise if you are able. Just 30 minutes of exercise each day  can help you feel better.  Take over-the-counter and prescription medicines only as told by your health care provider. Ask your mental health care provider about the possible side effects of any medicines you are taking.  Do not use alcohol or drugs, and remove these  substances from your home.  Remove weapons, poisons, knives, and other deadly items from your home. General recommendations  Keep your living space well lit.  When you are feeling well, write yourself a letter with tips and support that you can read when you are not feeling well.  Remember that life's difficulties can be sorted out with help. Conditions can be treated, and you can learn behaviors and ways of thinking that will help you. Where to find more information  National Suicide Prevention Lifeline: www.suicidepreventionlifeline.org  Hopeline: www.hopeline.[place] for Suicide Prevention: PromotionalLoans.co.za  The [employer] (for lesbian, gay, bisexual, transgender, or questioning youth): www.thetrevorproject.org Contact a health care provider if:  You feel as though you are a burden to others.  You feel agitated, angry, vengeful, or have extreme mood swings.  You have withdrawn from family and friends. Get help right away if:  You are talking about suicide or wishing to die.  You start making plans for how to commit suicide.  You feel that you have no reason to live.  You start making plans for putting your affairs in order, saying goodbye, or giving your possessions away.  You feel guilt, shame, or unbearable pain, and it seems like there is no way out.  You are frequently using drugs or alcohol.  You are engaging in risky behaviors that could lead to death. If you have any of these symptoms, get help right away. Call emergency services, go to your nearest emergency department or crisis center, or call a suicide crisis helpline. Summary  Suicide is when you take your own life.  Promise yourself that you will not do anything extreme when you have suicidal feelings.  Let family, friends, teachers, or counselors know how you are feeling.  Get help right away if you feel as though life is getting too tough to handle and you are thinking about  suicide. This information is not intended to replace advice given to you by your health care provider. Make sure you discuss any questions you have with your health care provider. Document Revised: 06/03/2018 Document Reviewed: 09/23/2016 Elsevier Patient Education  [place]. For your mental health needs, you are advised to follow up with [facility].  Call them at your earliest opportunity to schedule an intake appointment:       [facility]. [address]      [place], [place] [address]      [phone]      Crisis number: [phone]
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
## 2019-04-07 ENCOUNTER — Emergency Department (HOSPITAL_COMMUNITY)
Admission: EM | Admit: 2019-04-07 | Discharge: 2019-04-07 | Disposition: A | Discharge status: Home / Self Care | Payer: Medicaid Other | Attending: Emergency Medicine | Admitting: Emergency Medicine

## 2019-04-07 ENCOUNTER — Encounter (HOSPITAL_COMMUNITY): Payer: Self-pay

## 2019-04-07 ENCOUNTER — Other Ambulatory Visit: Payer: Self-pay

## 2019-04-07 DIAGNOSIS — Z79899 Other long term (current) drug therapy: Secondary | ICD-10-CM | POA: Diagnosis not present

## 2019-04-07 DIAGNOSIS — G809 Cerebral palsy, unspecified: Secondary | ICD-10-CM | POA: Insufficient documentation

## 2019-04-07 DIAGNOSIS — T1491XA Suicide attempt, initial encounter: Secondary | ICD-10-CM

## 2019-04-07 DIAGNOSIS — R45851 Suicidal ideations: Secondary | ICD-10-CM | POA: Insufficient documentation

## 2019-04-07 DIAGNOSIS — R569 Unspecified convulsions: Secondary | ICD-10-CM | POA: Insufficient documentation

## 2019-04-07 DIAGNOSIS — F4325 Adjustment disorder with mixed disturbance of emotions and conduct: Secondary | ICD-10-CM | POA: Diagnosis not present

## 2019-04-07 DIAGNOSIS — I10 Essential (primary) hypertension: Secondary | ICD-10-CM | POA: Diagnosis not present

## 2019-04-07 DIAGNOSIS — Z046 Encounter for general psychiatric examination, requested by authority: Secondary | ICD-10-CM | POA: Diagnosis present

## 2019-04-07 LAB — COMPREHENSIVE METABOLIC PANEL
ALT: 13 U/L (ref 0–44)
AST: 15 U/L (ref 15–41)
Albumin: 3.7 g/dL (ref 3.5–5.0)
Alkaline Phosphatase: 63 U/L (ref 38–126)
Anion gap: 6 (ref 5–15)
BUN: 16 mg/dL (ref 6–20)
CO2: 20 mmol/L — ABNORMAL LOW (ref 22–32)
Calcium: 8.6 mg/dL — ABNORMAL LOW (ref 8.9–10.3)
Chloride: 111 mmol/L (ref 98–111)
Creatinine, Ser: 0.85 mg/dL (ref 0.44–1.00)
GFR calc Af Amer: >60 mL/min (ref >60)
GFR calc non Af Amer: >60 mL/min (ref >60)
Glucose, Bld: 91 mg/dL (ref 70–99)
Potassium: 3.7 mmol/L (ref 3.5–5.1)
Sodium: 137 mmol/L (ref 135–145)
Total Bilirubin: 0.7 mg/dL (ref 0.3–1.2)
Total Protein: 7.3 g/dL (ref 6.5–8.1)

## 2019-04-07 LAB — CBC
HCT: 39 % (ref 36.0–46.0)
Hemoglobin: 12.3 g/dL (ref 12.0–15.0)
MCH: 25.5 pg — ABNORMAL LOW (ref 26.0–34.0)
MCHC: 31.5 g/dL (ref 30.0–36.0)
MCV: 80.7 fL (ref 80.0–100.0)
Platelets: 245 10*3/uL (ref 150–400)
RBC: 4.83 MIL/uL (ref 3.87–5.11)
RDW: 14.3 % (ref 11.5–15.5)
WBC: 4.3 10*3/uL (ref 4.0–10.5)
nRBC: 0 % (ref 0.0–0.2)

## 2019-04-07 LAB — RAPID URINE DRUG SCREEN, HOSP PERFORMED
Amphetamines: NOT DETECTED
Barbiturates: NOT DETECTED
Benzodiazepines: NOT DETECTED
Cocaine: NOT DETECTED
Opiates: NOT DETECTED
Tetrahydrocannabinol: NOT DETECTED

## 2019-04-07 LAB — I-STAT BETA HCG BLOOD, ED (MC, WL, AP ONLY): I-stat hCG, quantitative: <5 m[IU]/mL (ref <5)

## 2019-04-07 LAB — SALICYLATE LEVEL: Salicylate Lvl: <7 mg/dL — ABNORMAL LOW (ref 7.0–30.0)

## 2019-04-07 LAB — ETHANOL: Alcohol, Ethyl (B): <10 mg/dL (ref <10)

## 2019-04-07 LAB — ACETAMINOPHEN LEVEL: Acetaminophen (Tylenol), Serum: <10 ug/mL — ABNORMAL LOW (ref 10–30)
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
## 2019-04-07 NOTE — ED Notes (Signed)
Ordered diet tray for pt  

## 2019-04-07 NOTE — ED Provider Notes (Signed)
Patient cleared by behavioral health for discharge home.   Vanetta Mulders, MD 04/07/19 2102

## 2019-04-07 NOTE — ED Notes (Signed)
[phone]- Patricia [name] (father) would like pt status updates.
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
## 2019-04-07 NOTE — Discharge Instructions (Addendum)
Follow-up as per behavioral health.  Patient cleared by behavioral health

## 2019-04-07 NOTE — ED Notes (Signed)
Pt gave permission to call her brothers, voicemail left for both

## 2019-04-07 NOTE — Social Work (Addendum)
EDCSW called [phone] (message stated phone was not in service)  CSW used interpretation service in an effort to obtain mother's phone number, but was unsuccessful.  CSW used interpretation service to leave voicemail for brother [phone] in Swahili in an effort to connect with Pt's mother.  CSW provided Pt with shirt/pants and socks from TOC closet.

## 2019-04-07 NOTE — ED Notes (Signed)
[phone] called this number x 2; no answer.
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
## 2019-05-12 ENCOUNTER — Telehealth: Payer: Self-pay | Admitting: Neurology

## 2019-05-12 ENCOUNTER — Institutional Professional Consult (permissible substitution): Payer: Medicaid Other | Admitting: Neurology

## 2019-05-12 NOTE — Telephone Encounter (Signed)
This patient did not show for a revisit appointment today. 

## 2019-05-17 ENCOUNTER — Encounter: Payer: Self-pay | Admitting: Neurology

## 2019-05-19 ENCOUNTER — Other Ambulatory Visit: Payer: Self-pay | Admitting: Neurology

## 2019-05-19 ENCOUNTER — Telehealth: Payer: Self-pay

## 2019-05-19 ENCOUNTER — Other Ambulatory Visit: Payer: Self-pay

## 2019-05-19 MED ORDER — LEVETIRACETAM 500 MG PO TABS: 500.0000 mg | ORAL_TABLET | Freq: Two times a day (BID) | ORAL | 1 refills | Status: DC

## 2019-05-19 NOTE — Telephone Encounter (Signed)
I have called [facility] at [facility] and scheduled appointment for 06/15/19 @ 0840am. Patient sister in law who will accompany patient to this appointment made aware. Arman Bogus RN BSN PCCN  [phone]

## 2019-05-19 NOTE — Telephone Encounter (Signed)
Patricia [name] [facility] Congregational Nurse called stating that the pt has been out of her levETIRAcetam (KEPPRA) 500 MG tablet for a week and they are wanting to know if she can get a refill sent in to the [place] on [address] [facility]. Until her scheduled appt. Please advise.

## 2019-05-19 NOTE — Telephone Encounter (Signed)
I have called [facility] on behalf of Ms Iglesia and scheduled appointment for April 30th 2021 @ 1115. Patient to arrive at 1045am. Also, patient has ran out of Keppra and has not taken for a week or so. I have requested the office to consider a refill until she is seen on April 30th. Due to ongoing mental health issues, sister in law [name] [phone]) has been informed  Of the appointment and will accompany the patient.  Arman Bogus RN BSN PCCN [phone]

## 2019-05-19 NOTE — Congregational Nurse Program (Signed)
Patricia [name] came in accompanied by her sister in law Patricia [name].Patient has been to emergency room multiple times with mental health issues including being suicidal.Currently, Patricia [name] denies suicidal thoughts and and has no plan of harming self or others.Her only concern is the "children laughing at me because I fall". She has not been taking Keppra for about a week since she ran out of medication. She also missed an appointment on March 18th. Apparently sister in law was not aware of this appointment. I have called the Doctor`s office and scheduled appointment for April 30th and requested refill for Keppra. I have also scheduled appointment with primary care doctor and confirmed that she has an appointment with mental health specialist. Arman Bogus RN BSN PCCN  [phone]

## 2019-05-19 NOTE — Telephone Encounter (Signed)
I have called [facility] and scheduled appointment for April 30th @ 1115 am. Patient to arrive at 1045am. I have communicated this information to family( sister in law [name] [phone]) with the time,date and address.Ms Mcafee is out of  medication Keppra. I have requested the Doctors office for a refill. Arman Bogus RN BSN PCCN  [phone]

## 2019-05-19 NOTE — Telephone Encounter (Addendum)
I called [name] at [facility] Congregational Nurse called pt to have a refill until her appt on 06/24/2019. I stated pt was last seen 11/2017 and was a no show in 11/2018. She stated pt went to ED In January 2021 and was only given 30 day supply The pt has not been on medication in the last couple of weeks. Per med list her keppra would of ran out on 04/24/2019. I stated message will be sent to work in [place] for review.They want a refill till her appt.

## 2019-05-19 NOTE — Telephone Encounter (Signed)
I called [name] with [facility] congregational nurse department.i stated work in [place] gave pt a refill until her appt time on 06/24/2019.i stated the office closed at 1200pm on Friday and pt will need to check in at 1045 to be seen for seizure follow up. I stated pt must attend to appt. She will let pt know and appreciate the call.

## 2019-05-24 ENCOUNTER — Telehealth: Payer: Self-pay

## 2019-05-24 NOTE — Telephone Encounter (Signed)
French Ana, therapist with [facility], LVM on nurse line stating the patient has an upcoming apt with [name], Dr.Brown, on 4/9. French Ana states the patient would benefit from a referral to PSY and the continuation of medications she is on, and if this is something we could do here. I called French Ana back to get more information, however had to LVM.

## 2019-06-01 ENCOUNTER — Telehealth: Payer: Self-pay

## 2019-06-01 NOTE — Telephone Encounter (Signed)
I have called Fay[facility] Helper on behalf of client  Patricia [name] to scheduled transportation to and from the doctors appointment.I will follow up with the client to inform her pick up time. Arman Bogus RN BSN PCCN [phone]

## 2019-06-02 ENCOUNTER — Encounter: Payer: Self-pay | Admitting: Family Medicine

## 2019-06-02 ENCOUNTER — Telehealth: Payer: Self-pay

## 2019-06-02 NOTE — Telephone Encounter (Signed)
Client will be picked up @ 0852hr on 06/03/19 to be transported to Doctors appointment.I  have called client and informed of the same. Arman Bogus RN BSN PCCN [phone]

## 2019-06-03 ENCOUNTER — Ambulatory Visit: Payer: Medicaid Other | Admitting: Family Medicine

## 2019-06-03 ENCOUNTER — Other Ambulatory Visit: Payer: Self-pay

## 2019-06-03 ENCOUNTER — Telehealth: Payer: Self-pay | Admitting: Family Medicine

## 2019-06-03 ENCOUNTER — Encounter (HOSPITAL_COMMUNITY): Payer: Self-pay | Admitting: Emergency Medicine

## 2019-06-03 ENCOUNTER — Encounter: Payer: Self-pay | Admitting: Family Medicine

## 2019-06-03 ENCOUNTER — Emergency Department (HOSPITAL_COMMUNITY): Payer: Medicaid Other

## 2019-06-03 ENCOUNTER — Emergency Department (HOSPITAL_COMMUNITY)
Admission: EM | Admit: 2019-06-03 | Discharge: 2019-06-04 | Disposition: A | Discharge status: Home / Self Care | Payer: Medicaid Other | Attending: Emergency Medicine | Admitting: Emergency Medicine

## 2019-06-03 ENCOUNTER — Ambulatory Visit (HOSPITAL_COMMUNITY)
Admission: AD | Admit: 2019-06-03 | Discharge: 2019-06-03 | Disposition: A | Discharge status: Home / Self Care | Payer: Medicaid Other | Attending: Psychiatry | Admitting: Psychiatry

## 2019-06-03 ENCOUNTER — Ambulatory Visit (INDEPENDENT_AMBULATORY_CARE_PROVIDER_SITE_OTHER): Payer: Medicaid Other | Admitting: Family Medicine

## 2019-06-03 VITALS — BP 109/65 | HR 64 | Ht 61.18 in | Wt 194.0 lb

## 2019-06-03 DIAGNOSIS — Z20822 Contact with and (suspected) exposure to covid-19: Secondary | ICD-10-CM | POA: Diagnosis not present

## 2019-06-03 DIAGNOSIS — Z79899 Other long term (current) drug therapy: Secondary | ICD-10-CM | POA: Diagnosis not present

## 2019-06-03 DIAGNOSIS — R45851 Suicidal ideations: Secondary | ICD-10-CM | POA: Insufficient documentation

## 2019-06-03 DIAGNOSIS — F4325 Adjustment disorder with mixed disturbance of emotions and conduct: Secondary | ICD-10-CM | POA: Diagnosis present

## 2019-06-03 DIAGNOSIS — I10 Essential (primary) hypertension: Secondary | ICD-10-CM | POA: Insufficient documentation

## 2019-06-03 DIAGNOSIS — F329 Major depressive disorder, single episode, unspecified: Secondary | ICD-10-CM | POA: Diagnosis present

## 2019-06-03 DIAGNOSIS — G809 Cerebral palsy, unspecified: Secondary | ICD-10-CM | POA: Insufficient documentation

## 2019-06-03 DIAGNOSIS — Z046 Encounter for general psychiatric examination, requested by authority: Secondary | ICD-10-CM | POA: Diagnosis present

## 2019-06-03 LAB — ACETAMINOPHEN LEVEL: Acetaminophen (Tylenol), Serum: <10 ug/mL — ABNORMAL LOW (ref 10–30)

## 2019-06-03 LAB — BASIC METABOLIC PANEL
Anion gap: 8 (ref 5–15)
BUN: 11 mg/dL (ref 6–20)
CO2: 19 mmol/L — ABNORMAL LOW (ref 22–32)
Calcium: 9.1 mg/dL (ref 8.9–10.3)
Chloride: 110 mmol/L (ref 98–111)
Creatinine, Ser: 0.8 mg/dL (ref 0.44–1.00)
GFR calc Af Amer: >60 mL/min (ref >60)
GFR calc non Af Amer: >60 mL/min (ref >60)
Glucose, Bld: 88 mg/dL (ref 70–99)
Potassium: 3.6 mmol/L (ref 3.5–5.1)
Sodium: 137 mmol/L (ref 135–145)

## 2019-06-03 LAB — URINALYSIS, ROUTINE W REFLEX MICROSCOPIC
Bilirubin Urine: NEGATIVE
Glucose, UA: NEGATIVE mg/dL
Hgb urine dipstick: NEGATIVE
Ketones, ur: NEGATIVE mg/dL
Leukocytes,Ua: NEGATIVE
Nitrite: NEGATIVE
Protein, ur: NEGATIVE mg/dL
Specific Gravity, Urine: 1.004 — ABNORMAL LOW (ref 1.005–1.030)
pH: 6 (ref 5.0–8.0)

## 2019-06-03 LAB — CBC WITH DIFFERENTIAL/PLATELET
Abs Immature Granulocytes: 0.01 10*3/uL (ref 0.00–0.07)
Basophils Absolute: 0 10*3/uL (ref 0.0–0.1)
Basophils Relative: 1 %
Eosinophils Absolute: 0.2 10*3/uL (ref 0.0–0.5)
Eosinophils Relative: 5 %
HCT: 40.5 % (ref 36.0–46.0)
Hemoglobin: 12.6 g/dL (ref 12.0–15.0)
Immature Granulocytes: 0 %
Lymphocytes Relative: 49 %
Lymphs Abs: 2.6 10*3/uL (ref 0.7–4.0)
MCH: 26 pg (ref 26.0–34.0)
MCHC: 31.1 g/dL (ref 30.0–36.0)
MCV: 83.7 fL (ref 80.0–100.0)
Monocytes Absolute: 0.5 10*3/uL (ref 0.1–1.0)
Monocytes Relative: 9 %
Neutro Abs: 1.9 10*3/uL (ref 1.7–7.7)
Neutrophils Relative %: 36 %
Platelets: 250 10*3/uL (ref 150–400)
RBC: 4.84 MIL/uL (ref 3.87–5.11)
RDW: 14.5 % (ref 11.5–15.5)
WBC: 5.2 10*3/uL (ref 4.0–10.5)
nRBC: 0 % (ref 0.0–0.2)

## 2019-06-03 LAB — CBG MONITORING, ED: Glucose-Capillary: 95 mg/dL (ref 70–99)

## 2019-06-03 LAB — ETHANOL: Alcohol, Ethyl (B): <10 mg/dL (ref <10)

## 2019-06-03 LAB — I-STAT BETA HCG BLOOD, ED (MC, WL, AP ONLY): I-stat hCG, quantitative: <5 m[IU]/mL (ref <5)

## 2019-06-03 LAB — RAPID URINE DRUG SCREEN, HOSP PERFORMED
Amphetamines: NOT DETECTED
Barbiturates: NOT DETECTED
Benzodiazepines: NOT DETECTED
Cocaine: NOT DETECTED
Opiates: NOT DETECTED
Tetrahydrocannabinol: NOT DETECTED

## 2019-06-03 LAB — RESPIRATORY PANEL BY RT PCR (FLU A&B, COVID)
Influenza A by PCR: NEGATIVE
Influenza B by PCR: NEGATIVE
SARS Coronavirus 2 by RT PCR: NEGATIVE

## 2019-06-03 LAB — SALICYLATE LEVEL: Salicylate Lvl: <7 mg/dL — ABNORMAL LOW (ref 7.0–30.0)

## 2019-06-03 MED ORDER — LORAZEPAM 2 MG/ML IJ SOLN
2.0000 mg | Freq: Once | INTRAMUSCULAR | Status: AC
  Administered 2019-06-03: 2 mg via INTRAMUSCULAR
  Filled 2019-06-03: qty 1

## 2019-06-03 MED ORDER — LEVETIRACETAM IN NACL 1000 MG/100ML IV SOLN
1000.0000 mg | Freq: Once | INTRAVENOUS | Status: DC
  Filled 2019-06-03: qty 100

## 2019-06-03 MED ORDER — LEVETIRACETAM 500 MG PO TABS
500.0000 mg | ORAL_TABLET | Freq: Two times a day (BID) | ORAL | Status: DC
  Administered 2019-06-04: 09:00:00 500 mg via ORAL
  Filled 2019-06-03: qty 1

## 2019-06-03 MED ORDER — LORAZEPAM 2 MG/ML IJ SOLN: 1.0000 mg | Freq: Once | INTRAMUSCULAR | Status: DC

## 2019-06-03 MED ORDER — LEVETIRACETAM 500 MG PO TABS: 500.0000 mg | ORAL_TABLET | Freq: Two times a day (BID) | ORAL | 1 refills | Status: DC

## 2019-06-03 NOTE — ED Triage Notes (Signed)
Voluntary. Could not or would not participate in admisison assessment. Poor historian. Cooeprative at this time.

## 2019-06-03 NOTE — ED Notes (Signed)
Called lab to draw labs again.

## 2019-06-03 NOTE — ED Notes (Signed)
Used interpreter to request that pt stay in her room and sit in chair or bed. She will not comply.

## 2019-06-03 NOTE — ED Notes (Signed)
Pt refused Covid test 

## 2019-06-03 NOTE — ED Notes (Signed)
Pt changed into purple scrubs.   ONE BAG OF BELONGINGS PUT IN LOCKER 30
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
## 2019-06-03 NOTE — ED Notes (Signed)
This Clinical research associate unable to obtain labs. Called phlebotomy.   Offered pt ginger-ale - she refused.

## 2019-06-03 NOTE — ED Notes (Signed)
Pt was redirected to her door. Allowed injection to be given in left deltoid. Security staff stabilized her arm but did not have to hold her down.  Currently she is sitting in the middle of the floor.

## 2019-06-03 NOTE — Progress Notes (Signed)
Received Patricia [name] at the change of shift asleep in bed with the sitter at the bedside. She was awaken by the PA and asked to urinated via translator. Urine was sent to the lab. Her CBG was 95, afterwards she drifted back to sleep. She slept throughout the night without incident.

## 2019-06-03 NOTE — ED Notes (Signed)
Pt needs IV. This writer unable to start IV because did not visualize or feel appropriate vein. Will not stick blind. Nada Boozer RN said that she would come here and start her IV.  Cortni aware as well.  Pt is eating dinner, sitting up in the bed.
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
## 2019-06-03 NOTE — Patient Instructions (Addendum)
Please go directly to [address] to get some help today.  I will be following up to ensure you arrived.   [name], MD  Family Medicine   . Marland Kitchen
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
## 2019-06-03 NOTE — Progress Notes (Addendum)
TOC CM in to see pt and pt recently received Ativan to rest. Pt has Medicaid that covers meds, with a $3 copay.  Isidoro Donning RN CCM, [facility] ED TOC CM [phone]

## 2019-06-03 NOTE — ED Notes (Signed)
Pt reports - through an interpreter - that she wants to go home.
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
## 2019-06-04 NOTE — Progress Notes (Signed)
CSW was able to speak with brother, Sandi Raveling [phone]) through [facility] Gust Rung [phone]).  Brother was notified of patient's readiness for discharge. Patient will need transportation assistance and brother was agreeable to patient returning this afternoon via taxi. Brother states patient's mother and father are at the family home and confirmed the address for CSW.  CSW provided taxi voucher to patient's nurse.  CSW signing off, please reconsult if additional social work needs arise.

## 2019-06-04 NOTE — ED Notes (Signed)
Pt DC d off unit to home per provider. Pt alert, alert, calm, cooperative, no s/s of distress.  DC information given to pt for family. Belongings given to pt. Pt ambulatory off unit, escorted by RN. Pt transported by taxi.

## 2019-06-04 NOTE — Progress Notes (Addendum)
2:20pm CSW attempted to reach patient's siblings again. Sister [phone]) and brother's [phone]) phone numbers are not in service.  Brother, Mamoni's phone is in service [phone]). Two calls were made, neither call was answered. A voicemail was left earlier.  1:30pm CSW met with patient at bedside with interpreting service. Patient endorsed that she feels both safe and ready to discharge home.  CSW explained to patient that writer has attempted to reach her family by phone but has not spoken to anyone. Patient was unable to identify other family or support contacts; further, patient does not know any of her family member's phone numbers.  CSW inquired if patient would be agreeable to taking a taxi home, patient states she would be agreeable; however patient could not verify her address for CSW.   CSW asked if there are any family members at home, patient states her mother and brother should be at home. Patient confirms she has not spoken to any family since arriving at [facility] and she does not think her family knows she is in the hospital.  Echo called non-emergency [place] and requested a welfare check at the family home, CSW identified patient's brother as the primary point of contact. Callback information was provided to the dispatcher who accepted the request.  -Officer Gilmore (?) called CSW at 1:35pm and reported no one answered the door at the family home: [place] [id number]  CSW continuing to follow for a safe discharge plan.  12:45pm CSW has not received a returned call. CSW attempted to reach family supports (sister and brothers) using [employer] [phone]). None of the phone numbers on patient's Facesheet answered, patient's sister's phone was also not in service.  CSW following.    10:45am CSW informed that patient is psychiatrically and medically cleared for discharge.  CSW attempted to reach family to coordinate  discharge planning and to offer transportation assistance if needed.  CSW attempted to reach two family members via [employer] [phone]).   Brother Donzetta Starch was called first. The number is not in service. CSW team has successfully used this number to reach family during previous healthcare encounters.  Brother Gertha Calkin was called second, several calls were sent to voicemail. A detailed message with callback information was left.   Stephanie Acre, [place] Social Worker [phone]

## 2019-06-04 NOTE — ED Notes (Signed)
Pt breakfast tray delivered, pt still sleeping at this time.

## 2019-06-04 NOTE — Consult Note (Addendum)
[facility] Psych ED Discharge  06/04/2019 10:13 AM Patricia [name]  MRN:  [id number] Principal Problem: Adjustment disorder with mixed disturbance of emotions and conduct Discharge Diagnoses: Principal Problem:   Adjustment disorder with mixed disturbance of emotions and conduct   Subjective: Patient assessed by Nurse Practitioner along with Dr Darleene Cleaver. Patient alert and oriented, answers appropriately. Interpreter (Swahili) used for language interpretation.  Patient states "I feel better, I'm ready to go home." Patient denies suicidal and homicidal ideations. Patient denies auditory and visual hallucinations. Patient denies symptoms of paranoia.  Patient lives at home with mother. Patient denies access to weapons. Patient denies alcohol  and substance use disorder.  Total Time spent with patient: 30 minutes  Past Psychiatric History: Adjustment disorder with mixed disturbance of emotions and conduct  Past Medical History:  Past Medical History:  Diagnosis Date  . Cerebral palsy ([place])   . Hypertension   . Seizures ([place])   . Suicidal behavior    No past surgical history on file. Family History:  Family History  Problem Relation Age of Onset  . Hepatitis Brother    Family Psychiatric  History: Unknown Social History:  Social History   Substance and Sexual Activity  Alcohol Use Yes   Comment: occasional alcohol use     Social History   Substance and Sexual Activity  Drug Use Never    Social History   Socioeconomic History  . Marital status: Single    Spouse name: Not on file  . Number of children: 1  . Years of education: Not on file  . Highest education level: Not on file  Occupational History    Comment: none  Tobacco Use  . Smoking status: Never Smoker  . Smokeless tobacco: Never Used  Substance and Sexual Activity  . Alcohol use: Yes    Comment: occasional alcohol use  . Drug use: Never  . Sexual activity: Not Currently  Other Topics Concern  . Not on file   Social History Narrative   ** Merged History Encounter **       ** Merged History Encounter **       ** Merged History Encounter **       Lives with mother, family   Social Determinants of Health   Financial Resource Strain:   . Difficulty of Paying Living Expenses:   Food Insecurity:   . Worried About Charity fundraiser in the Last Year:   . Arboriculturist in the Last Year:   Transportation Needs:   . Film/video editor (Medical):   Marland Kitchen Lack of Transportation (Non-Medical):   Physical Activity:   . Days of Exercise per Week:   . Minutes of Exercise per Session:   Stress:   . Feeling of Stress :   Social Connections:   . Frequency of Communication with Friends and Family:   . Frequency of Social Gatherings with Friends and Family:   . Attends Religious Services:   . Active Member of Clubs or Organizations:   . Attends Archivist Meetings:   Marland Kitchen Marital Status:     Has this patient used any form of tobacco in the last 30 days? (Cigarettes, Smokeless Tobacco, Cigars, and/or Pipes) A prescription for an FDA-approved tobacco cessation medication was offered at discharge and the patient refused  Current Medications: Current Facility-Administered Medications  Medication Dose Route Frequency Provider Last Rate Last Admin  . levETIRAcetam (KEPPRA) IVPB 1000 mg/100 mL premix  1,000 mg Intravenous Once Couture, Cortni  S, PA-C   Stopped at 06/03/19 1909  . levETIRAcetam (KEPPRA) tablet 500 mg  500 mg Oral BID Couture, Cortni S, PA-C   500 mg at 06/04/19 0912  . LORazepam (ATIVAN) injection 1 mg  1 mg Intramuscular Once Couture, Cortni S, PA-C   Stopped at 06/03/19 1747   Current Outpatient Medications  Medication Sig Dispense Refill  . levETIRAcetam (KEPPRA) 500 MG tablet Take 1 tablet (500 mg total) by mouth 2 (two) times daily. 60 tablet 1   PTA Medications: (Not in a hospital admission)   Musculoskeletal: Strength & Muscle Tone: within normal limits Gait &  Station: normal Patient leans: N/A  Psychiatric Specialty Exam: Physical Exam Vitals and nursing note reviewed.  Constitutional:      Appearance: She is well-developed.  HENT:     Head: Normocephalic.  Cardiovascular:     Rate and Rhythm: Normal rate.  Pulmonary:     Effort: Pulmonary effort is normal.  Neurological:     Mental Status: She is alert and oriented to person, place, and time.  Psychiatric:        Mood and Affect: Mood normal.        Behavior: Behavior normal.        Thought Content: Thought content normal.     Review of Systems  Constitutional: Negative.   HENT: Negative.   Eyes: Negative.   Respiratory: Negative.   Cardiovascular: Negative.   Gastrointestinal: Negative.   Genitourinary: Negative.   Musculoskeletal: Negative.   Skin: Negative.   Neurological: Negative.   Psychiatric/Behavioral: Negative.     Blood pressure (!) 93/59, pulse 71, temperature 98.6 F (37 C), temperature source Oral, resp. rate 18, SpO2 100 %.There is no height or weight on file to calculate BMI.  General Appearance: Casual and Fairly Groomed  Eye Contact:  Good  Speech:  Clear and Coherent and Normal Rate  Volume:  Normal  Mood:  Euthymic  Affect:  Congruent  Thought Process:  Coherent, Goal Directed and Descriptions of Associations: Intact  Orientation:  Full (Time, Place, and Person)  Thought Content:  Logical  Suicidal Thoughts:  No  Homicidal Thoughts:  No  Memory:  Immediate;   Good Recent;   Good Remote;   Good  Judgement:  Fair  Insight:  Fair  Psychomotor Activity:  Normal  Concentration:  Concentration: Good and Attention Span: Good  Recall:  Good  Fund of Knowledge:  Fair  Language:  Fair  Akathisia:  No  Handed:  Right  AIMS (if indicated):     Assets:  Communication Skills Desire for Improvement Financial Resources/Insurance Housing Intimacy Leisure Time Physical Health Resilience Social Support  ADL's:  Intact  Cognition:  WNL  Sleep:         Demographic Factors:  NA  Loss Factors: NA  Historical Factors: NA  Risk Reduction Factors:   Living with another person, especially a relative, Positive social support, Positive therapeutic relationship and Positive coping skills or problem solving skills  Continued Clinical Symptoms:  More than one psychiatric diagnosis  Cognitive Features That Contribute To Risk:  None    Suicide Risk:  Minimal: No identifiable suicidal ideation.  Patients presenting with no risk factors but with morbid ruminations; may be classified as minimal risk based on the severity of the depressive symptoms    Plan Of Care/Follow-up recommendations:  Will contact family to transport patient home.  Other:  Follow up with outpatient psychiatry  Disposition: Discharge Patrcia Dolly, FNP 06/04/2019, 10:13 AM  Patient seen face-to-face for psychiatric evaluation, chart reviewed and case discussed with the physician extender and developed treatment plan. Reviewed the information documented and agree with the treatment plan. Corena Pilgrim, MD

## 2019-06-04 NOTE — ED Notes (Signed)
Pt given sandwich, pt lying down at this time.

## 2019-06-04 NOTE — ED Notes (Signed)
Pt ambulated to restroom. 

## 2019-06-04 NOTE — Discharge Planning (Signed)
RNCM consulted regarding medication assistance.  Pt is insured with a plan that provides Rx with $3.00 co-pay.  Pt will not qualify for additional medication assistance at this time.

## 2019-06-04 NOTE — ED Notes (Signed)
Pt is alert this shift. Pt cooperative. Medication compliant. Pt resting in bed. SW working on safe DC

## 2019-06-06 ENCOUNTER — Other Ambulatory Visit: Payer: Self-pay | Admitting: *Deleted

## 2019-06-07 ENCOUNTER — Telehealth: Payer: Self-pay

## 2019-06-07 ENCOUNTER — Ambulatory Visit: Payer: Medicaid Other

## 2019-06-07 NOTE — Telephone Encounter (Signed)
I called to check on Patricia [name] and spoke with sister in law. She reports that Patricia [name] is doing well and she is on the way to pharmacy to pick up medications. She understands to call me with questions or concerns. Arman Bogus RN BSN PCCN [phone]

## 2019-06-09 ENCOUNTER — Telehealth: Payer: Self-pay | Admitting: Family Medicine

## 2019-06-09 NOTE — Telephone Encounter (Signed)
Spoke with patient's Child psychotherapist at APS-- Rulon Sera.   Patient told Ms. Katrinka Blazing that she wanted to be with her father who has passed away. Ms. Katrinka Blazing last saw patient on Monday, 4/12.   All questions answered. APS caseworker direct line: [phone]   [name], MD  Family Medicine Teaching Service

## 2019-06-09 NOTE — Telephone Encounter (Signed)
Spoke with APS worker- Ebony.  Gave detailed report of encounter last week and appropriate contact information.  [name], MD  Family Medicine Teaching Service

## 2019-06-13 LAB — LEVETIRACETAM LEVEL: Levetiracetam Lvl: <1 ug/mL — ABNORMAL LOW (ref 10.0–40.0)

## 2019-06-14 ENCOUNTER — Telehealth: Payer: Self-pay

## 2019-06-14 ENCOUNTER — Other Ambulatory Visit: Payer: Self-pay

## 2019-06-14 NOTE — Telephone Encounter (Signed)
I have called Patricia [name] and spoke with her sister in law. She states that Patricia [name] is doing well and she is has not experienced new mental health crisis. She has appointment with Dr Christoper Allegra on 06/15/19. She does not require my assistance for transportation at this time. Arman Bogus RN BSN PCCN [phone]

## 2019-06-15 ENCOUNTER — Ambulatory Visit: Payer: Medicaid Other | Admitting: Advanced Practice Midwife

## 2019-06-24 ENCOUNTER — Institutional Professional Consult (permissible substitution): Payer: Medicaid Other | Admitting: Neurology

## 2019-06-27 ENCOUNTER — Emergency Department (HOSPITAL_COMMUNITY)
Admission: EM | Admit: 2019-06-27 | Discharge: 2019-06-29 | Disposition: A | Discharge status: Other Acute Inpatient Hospital | Payer: Medicaid Other | Attending: Emergency Medicine | Admitting: Emergency Medicine

## 2019-06-27 ENCOUNTER — Encounter (HOSPITAL_COMMUNITY): Payer: Self-pay

## 2019-06-27 DIAGNOSIS — Z20822 Contact with and (suspected) exposure to covid-19: Secondary | ICD-10-CM | POA: Insufficient documentation

## 2019-06-27 DIAGNOSIS — G809 Cerebral palsy, unspecified: Secondary | ICD-10-CM | POA: Insufficient documentation

## 2019-06-27 DIAGNOSIS — Z8669 Personal history of other diseases of the nervous system and sense organs: Secondary | ICD-10-CM | POA: Insufficient documentation

## 2019-06-27 DIAGNOSIS — F333 Major depressive disorder, recurrent, severe with psychotic symptoms: Secondary | ICD-10-CM | POA: Insufficient documentation

## 2019-06-27 DIAGNOSIS — T1491XA Suicide attempt, initial encounter: Secondary | ICD-10-CM

## 2019-06-27 DIAGNOSIS — I10 Essential (primary) hypertension: Secondary | ICD-10-CM | POA: Insufficient documentation

## 2019-06-27 DIAGNOSIS — Z79899 Other long term (current) drug therapy: Secondary | ICD-10-CM | POA: Insufficient documentation

## 2019-06-27 LAB — I-STAT BETA HCG BLOOD, ED (MC, WL, AP ONLY): I-stat hCG, quantitative: <5 m[IU]/mL (ref <5)

## 2019-06-27 LAB — COMPREHENSIVE METABOLIC PANEL
ALT: 14 U/L (ref 0–44)
AST: 21 U/L (ref 15–41)
Albumin: 3.7 g/dL (ref 3.5–5.0)
Alkaline Phosphatase: 58 U/L (ref 38–126)
Anion gap: 9 (ref 5–15)
BUN: 11 mg/dL (ref 6–20)
CO2: 17 mmol/L — ABNORMAL LOW (ref 22–32)
Calcium: 9.3 mg/dL (ref 8.9–10.3)
Chloride: 110 mmol/L (ref 98–111)
Creatinine, Ser: 0.98 mg/dL (ref 0.44–1.00)
GFR calc Af Amer: >60 mL/min (ref >60)
GFR calc non Af Amer: >60 mL/min (ref >60)
Glucose, Bld: 90 mg/dL (ref 70–99)
Potassium: 3.8 mmol/L (ref 3.5–5.1)
Sodium: 136 mmol/L (ref 135–145)
Total Bilirubin: 0.6 mg/dL (ref 0.3–1.2)
Total Protein: 7.1 g/dL (ref 6.5–8.1)

## 2019-06-27 LAB — RESPIRATORY PANEL BY RT PCR (FLU A&B, COVID)
Influenza A by PCR: NEGATIVE
Influenza B by PCR: NEGATIVE
SARS Coronavirus 2 by RT PCR: NEGATIVE

## 2019-06-27 LAB — ETHANOL: Alcohol, Ethyl (B): <10 mg/dL (ref <10)

## 2019-06-27 MED ORDER — ONDANSETRON HCL 4 MG/2ML IJ SOLN
4.0000 mg | Freq: Once | INTRAMUSCULAR | Status: AC
  Administered 2019-06-28: 4 mg via INTRAVENOUS
  Filled 2019-06-27: qty 2

## 2019-06-27 NOTE — ED Notes (Signed)
Pt is in the stretcher with no restraints at this time. [employer] is at bedside.
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
## 2019-06-27 NOTE — ED Notes (Signed)
PT did not come in with any belongings besides the clothes she is wearing currently.
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
## 2019-06-28 ENCOUNTER — Other Ambulatory Visit: Payer: Self-pay

## 2019-06-28 LAB — RAPID URINE DRUG SCREEN, HOSP PERFORMED
Amphetamines: NOT DETECTED
Barbiturates: NOT DETECTED
Benzodiazepines: POSITIVE — AB
Cocaine: NOT DETECTED
Opiates: NOT DETECTED
Tetrahydrocannabinol: NOT DETECTED

## 2019-06-28 LAB — CBC WITH DIFFERENTIAL/PLATELET
Abs Immature Granulocytes: 0.02 10*3/uL (ref 0.00–0.07)
Basophils Absolute: 0 10*3/uL (ref 0.0–0.1)
Basophils Relative: 0 %
Eosinophils Absolute: 0.3 10*3/uL (ref 0.0–0.5)
Eosinophils Relative: 4 %
HCT: 37.9 % (ref 36.0–46.0)
Hemoglobin: 12.3 g/dL (ref 12.0–15.0)
Immature Granulocytes: 0 %
Lymphocytes Relative: 44 %
Lymphs Abs: 2.9 10*3/uL (ref 0.7–4.0)
MCH: 26.3 pg (ref 26.0–34.0)
MCHC: 32.5 g/dL (ref 30.0–36.0)
MCV: 81.2 fL (ref 80.0–100.0)
Monocytes Absolute: 0.7 10*3/uL (ref 0.1–1.0)
Monocytes Relative: 10 %
Neutro Abs: 2.8 10*3/uL (ref 1.7–7.7)
Neutrophils Relative %: 42 %
Platelets: 242 10*3/uL (ref 150–400)
RBC: 4.67 MIL/uL (ref 3.87–5.11)
RDW: 14.1 % (ref 11.5–15.5)
WBC: 6.7 10*3/uL (ref 4.0–10.5)
nRBC: 0 % (ref 0.0–0.2)

## 2019-06-28 MED ORDER — LEVETIRACETAM 500 MG PO TABS
500.0000 mg | ORAL_TABLET | Freq: Two times a day (BID) | ORAL | Status: DC
  Administered 2019-06-28 – 2019-06-29 (×3): 500 mg via ORAL
  Filled 2019-06-28 (×3): qty 1

## 2019-06-28 NOTE — Progress Notes (Signed)
[facility] report made with [facility] DSS.   Wells Guiles, LCSW, LCAS Disposition CSW [facility]/TTS [phone]

## 2019-06-28 NOTE — ED Notes (Signed)
[employer] is no longer at bedside
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
## 2019-06-28 NOTE — Progress Notes (Signed)
Pt meets inpatient criteria per Denzil Magnuson, NP. Referral information has been sent to the following hospitals for review: [facility] Details [facility] Details [facility] Details [facility] Details [facility] Details  [facility] Details [facility] Details [facility] Details  [facility]  Disposition will continue to assist with inpatient placement needs.  Wells Guiles, LCSW, LCAS Disposition CSW [facility]/TTS [phone]

## 2019-06-28 NOTE — ED Notes (Addendum)
Attempted to wake pt for TTS - pt too sleepy. Sitter has urine cup d/t need urine specimen.
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
## 2019-06-28 NOTE — ED Notes (Signed)
TTS being performed.  
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
## 2019-06-28 NOTE — BH Assessment (Signed)
Clinician contacted pt's nurse, Monique RN, to inquire about completing pt's BH Assessment with her. Pt's nurse stated pt continues to be under the influence of the injections she received earlier to assist in her calming herself and is unable to be aroused at this time. TTS will attempt to complete the assessment at a later time.

## 2019-06-29 ENCOUNTER — Encounter (HOSPITAL_COMMUNITY): Payer: Self-pay | Admitting: Psychiatry

## 2019-06-29 ENCOUNTER — Other Ambulatory Visit: Payer: Self-pay

## 2019-06-29 ENCOUNTER — Inpatient Hospital Stay (HOSPITAL_COMMUNITY)
Admission: AD | Admit: 2019-06-29 | Discharge: 2019-07-01 | DRG: 882 | Disposition: A | Discharge status: Home / Self Care | Payer: Medicaid Other | Attending: Psychiatry | Admitting: Psychiatry

## 2019-06-29 DIAGNOSIS — Z046 Encounter for general psychiatric examination, requested by authority: Secondary | ICD-10-CM | POA: Diagnosis present

## 2019-06-29 DIAGNOSIS — G809 Cerebral palsy, unspecified: Secondary | ICD-10-CM | POA: Diagnosis present

## 2019-06-29 DIAGNOSIS — F329 Major depressive disorder, single episode, unspecified: Secondary | ICD-10-CM | POA: Diagnosis present

## 2019-06-29 DIAGNOSIS — F341 Dysthymic disorder: Secondary | ICD-10-CM

## 2019-06-29 DIAGNOSIS — F79 Unspecified intellectual disabilities: Secondary | ICD-10-CM | POA: Diagnosis present

## 2019-06-29 DIAGNOSIS — R45851 Suicidal ideations: Secondary | ICD-10-CM | POA: Diagnosis present

## 2019-06-29 DIAGNOSIS — R45850 Homicidal ideations: Secondary | ICD-10-CM | POA: Diagnosis not present

## 2019-06-29 DIAGNOSIS — G40909 Epilepsy, unspecified, not intractable, without status epilepticus: Secondary | ICD-10-CM | POA: Diagnosis present

## 2019-06-29 DIAGNOSIS — Z20822 Contact with and (suspected) exposure to covid-19: Secondary | ICD-10-CM | POA: Diagnosis not present

## 2019-06-29 DIAGNOSIS — F4325 Adjustment disorder with mixed disturbance of emotions and conduct: Secondary | ICD-10-CM | POA: Diagnosis present

## 2019-06-29 DIAGNOSIS — F89 Unspecified disorder of psychological development: Secondary | ICD-10-CM

## 2019-06-29 DIAGNOSIS — I10 Essential (primary) hypertension: Secondary | ICD-10-CM | POA: Diagnosis not present

## 2019-06-29 DIAGNOSIS — F259 Schizoaffective disorder, unspecified: Secondary | ICD-10-CM | POA: Diagnosis not present

## 2019-06-29 MED ORDER — LEVETIRACETAM 500 MG PO TABS
500.0000 mg | ORAL_TABLET | Freq: Two times a day (BID) | ORAL | Status: DC
  Administered 2019-06-29 – 2019-07-01 (×4): 500 mg via ORAL
  Filled 2019-06-29 (×9): qty 1
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
## 2019-06-29 NOTE — ED Notes (Signed)
Pt took shower °
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
## 2019-06-29 NOTE — Tx Team (Addendum)
Initial Treatment Plan 06/29/2019 6:38 PM Patricia [name] INE:[id number]    PATIENT STRESSORS: Marital or family conflict Traumatic event   PATIENT STRENGTHS: General fund of knowledge Physical Health   PATIENT IDENTIFIED PROBLEMS: Psychosis  Suicidal ideation  Depression  Anxiety               DISCHARGE CRITERIA:  Improved stabilization in mood, thinking, and/or behavior Motivation to continue treatment in a less acute level of care Safe-care adequate arrangements made  PRELIMINARY DISCHARGE PLAN: Outpatient therapy  PATIENT/FAMILY INVOLVEMENT: This treatment plan has been presented to and reviewed with the patient, Patricia [name].  The patient has been given the opportunity to ask questions and make suggestions.  Garnette Scheuermann, RN 06/29/2019, 6:38 PM

## 2019-06-30 DIAGNOSIS — R45851 Suicidal ideations: Secondary | ICD-10-CM

## 2019-06-30 DIAGNOSIS — F89 Unspecified disorder of psychological development: Secondary | ICD-10-CM

## 2019-06-30 LAB — LIPID PANEL
Cholesterol: 129 mg/dL (ref 0–200)
HDL: 38 mg/dL — ABNORMAL LOW (ref >40)
LDL Cholesterol: 80 mg/dL (ref 0–99)
Total CHOL/HDL Ratio: 3.4 RATIO
Triglycerides: 55 mg/dL (ref <150)
VLDL: 11 mg/dL (ref 0–40)

## 2019-06-30 LAB — TSH: TSH: 1.312 u[IU]/mL (ref 0.350–4.500)

## 2019-06-30 MED ORDER — ALUM & MAG HYDROXIDE-SIMETH 200-200-20 MG/5ML PO SUSP: 30.0000 mL | ORAL | Status: DC | PRN

## 2019-06-30 MED ORDER — TRAZODONE HCL 50 MG PO TABS: 50.0000 mg | ORAL_TABLET | Freq: Every evening | ORAL | Status: DC | PRN

## 2019-06-30 MED ORDER — MAGNESIUM HYDROXIDE 400 MG/5ML PO SUSP: 30.0000 mL | Freq: Every day | ORAL | Status: DC | PRN

## 2019-06-30 MED ORDER — ACETAMINOPHEN 325 MG PO TABS: 650.0000 mg | ORAL_TABLET | Freq: Four times a day (QID) | ORAL | Status: DC | PRN

## 2019-06-30 MED ORDER — HYDROXYZINE HCL 25 MG PO TABS: 25.0000 mg | ORAL_TABLET | Freq: Three times a day (TID) | ORAL | Status: DC | PRN

## 2019-06-30 NOTE — Progress Notes (Signed)
   06/30/19 2130  Psych Admission Type (Psych Patients Only)  Admission Status Involuntary  Psychosocial Assessment  Patient Complaints Confusion;Disorientation  Eye Contact Brief;Darting  Facial Expression Animated;Anxious  Affect Anxious;Preoccupied;Sad  Speech Language other than English;Rapid (speaks Swahili)  Motor Activity Slow  Appearance/Hygiene Unremarkable  Behavior Characteristics Cooperative;Anxious  Mood Anxious;Preoccupied  Thought Process  Coherency Disorganized;Concrete thinking  Content Preoccupation;Paranoia;Blaming others  Delusions Paranoid;Persecutory  Perception WDL  Hallucination None reported or observed  Judgment Limited  Confusion Moderate  Danger to Self  Current suicidal ideation? Denies  Danger to Others  Danger to Others None reported or observed   Pt seen in room. Interpreter used to speak with pt. Pt denies SI, HI, AVH and pain. Pt slow to answer. Pt preoccupied with her family, "My family treats me bad because of my mental state. They say that I am a sinner." Pt needs prompting to follow your directions. Pt did say that she would stay with her mother after discharge. Says that her biological father was killed in the [place].
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
## 2019-06-30 NOTE — Progress Notes (Signed)
Recreation Therapy Notes  Date: 5.6.21 Time: 0930-1020 Location: [address] Dayroom  Group Topic: Self-Esteem  Goal Area(s) Addresses:  Patient will successfully identify positive attributes about themselves.  Patient will successfully identify benefit of improved self-esteem.   Intervention: Holiday representative paper, scissors, glue sticks, magazines  Activity: Collage About Me.  Patients were to use the magazines to find words and pictures the highlight things they like, things they want to try, things they have accomplished or things that inspire them.  Patients would then glue the things they found onto the construction paper.  Education:  Self-Esteem, Building control surveyor.   Education Outcome: Acknowledges education/In group clarification offered/Needs additional education  Clinical Observations/Feedback: Pt did not attend group session.      Caroll Rancher, LRT/CTRS         Caroll Rancher A 06/30/2019 10:53 AM

## 2019-06-30 NOTE — [facility] Counselor (Signed)
CSW received a message that patient's family (unsure of specific family member) had called [facility] looking for information regarding the patient. CSW unfortunately does not have permission to speak with family at this time. The call back number left was [phone]  Enid Cutter, MSW, LCSW-A Clinical Social Worker [facility]
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
## 2019-07-01 ENCOUNTER — Emergency Department (HOSPITAL_COMMUNITY)
Admission: EM | Admit: 2019-07-01 | Discharge: 2019-07-02 | Disposition: A | Discharge status: Home / Self Care | Payer: Medicaid Other | Attending: Emergency Medicine | Admitting: Emergency Medicine

## 2019-07-01 ENCOUNTER — Encounter (HOSPITAL_COMMUNITY): Payer: Self-pay | Admitting: Emergency Medicine

## 2019-07-01 DIAGNOSIS — I10 Essential (primary) hypertension: Secondary | ICD-10-CM | POA: Insufficient documentation

## 2019-07-01 DIAGNOSIS — F89 Unspecified disorder of psychological development: Secondary | ICD-10-CM

## 2019-07-01 DIAGNOSIS — R45851 Suicidal ideations: Secondary | ICD-10-CM | POA: Insufficient documentation

## 2019-07-01 DIAGNOSIS — G809 Cerebral palsy, unspecified: Secondary | ICD-10-CM | POA: Insufficient documentation

## 2019-07-01 DIAGNOSIS — F259 Schizoaffective disorder, unspecified: Secondary | ICD-10-CM | POA: Insufficient documentation

## 2019-07-01 DIAGNOSIS — Z20822 Contact with and (suspected) exposure to covid-19: Secondary | ICD-10-CM | POA: Insufficient documentation

## 2019-07-01 DIAGNOSIS — F341 Dysthymic disorder: Secondary | ICD-10-CM

## 2019-07-01 DIAGNOSIS — R45850 Homicidal ideations: Secondary | ICD-10-CM | POA: Insufficient documentation

## 2019-07-01 LAB — COMPREHENSIVE METABOLIC PANEL
ALT: 14 U/L (ref 0–44)
AST: 18 U/L (ref 15–41)
Albumin: 3.8 g/dL (ref 3.5–5.0)
Alkaline Phosphatase: 62 U/L (ref 38–126)
Anion gap: 12 (ref 5–15)
BUN: 13 mg/dL (ref 6–20)
CO2: 18 mmol/L — ABNORMAL LOW (ref 22–32)
Calcium: 9.1 mg/dL (ref 8.9–10.3)
Chloride: 107 mmol/L (ref 98–111)
Creatinine, Ser: 0.88 mg/dL (ref 0.44–1.00)
GFR calc Af Amer: >60 mL/min (ref >60)
GFR calc non Af Amer: >60 mL/min (ref >60)
Glucose, Bld: 91 mg/dL (ref 70–99)
Potassium: 3.6 mmol/L (ref 3.5–5.1)
Sodium: 137 mmol/L (ref 135–145)
Total Bilirubin: 0.4 mg/dL (ref 0.3–1.2)
Total Protein: 7.4 g/dL (ref 6.5–8.1)

## 2019-07-01 LAB — CBC
HCT: 39.3 % (ref 36.0–46.0)
Hemoglobin: 12.5 g/dL (ref 12.0–15.0)
MCH: 26.2 pg (ref 26.0–34.0)
MCHC: 31.8 g/dL (ref 30.0–36.0)
MCV: 82.2 fL (ref 80.0–100.0)
Platelets: 254 10*3/uL (ref 150–400)
RBC: 4.78 MIL/uL (ref 3.87–5.11)
RDW: 14 % (ref 11.5–15.5)
WBC: 7 10*3/uL (ref 4.0–10.5)
nRBC: 0 % (ref 0.0–0.2)

## 2019-07-01 LAB — ACETAMINOPHEN LEVEL: Acetaminophen (Tylenol), Serum: <10 ug/mL — ABNORMAL LOW (ref 10–30)

## 2019-07-01 LAB — HEMOGLOBIN A1C
Hgb A1c MFr Bld: 5.3 % (ref 4.8–5.6)
Mean Plasma Glucose: 105.41 mg/dL

## 2019-07-01 LAB — SALICYLATE LEVEL: Salicylate Lvl: <7 mg/dL — ABNORMAL LOW (ref 7.0–30.0)

## 2019-07-01 LAB — ETHANOL: Alcohol, Ethyl (B): <10 mg/dL (ref <10)

## 2019-07-01 LAB — I-STAT BETA HCG BLOOD, ED (MC, WL, AP ONLY): I-stat hCG, quantitative: <5 m[IU]/mL (ref <5)

## 2019-07-01 NOTE — ED Notes (Signed)
Pt belongings are in [facility] [place] [address].
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
## 2019-07-01 NOTE — ED Notes (Signed)
Pt has been wanded by [facility]. 

## 2019-07-01 NOTE — ED Notes (Signed)
Pt changed into purple scrubs. [facility] has been called to come want the pt.

## 2019-07-01 NOTE — Progress Notes (Signed)
Recreation Therapy Notes  Date: 5.7.21 Time: 1000 Location: 500 Hall Dayroom  Group Topic: Communication, Team Building, Problem Solving  Goal Area(s) Addresses:  Patient will effectively work with peer towards shared goal.  Patient will identify skill used to make activity successful.  Patient will identify how skills used during activity can be used to reach post d/c goals.   Intervention: STEM Activity   Activity: Straw Bridge.  In groups, patients were given 15 straws and 2ft masking tape.  Patients were to build a freestanding bridge that can hold a small puzzle box without falling over.  Education: Social Skills, Discharge Planning.   Education Outcome: Acknowledges education  Clinical Observations/Feedback:  Pt did not attend group session.    Mylee Falin, LRT/CTRS         Keionte Swicegood A 07/01/2019 11:49 AM 
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
## 2019-07-01 NOTE — Progress Notes (Signed)
Pt discharged to lobby. Pt was stable and appreciative at that time. All papers and prescriptions were given and valuables returned. Verbal understanding expressed. Denies SI/HI and A/VH. Pt given opportunity to express concerns and ask questions via online interpreting service.
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
## 2019-07-01 NOTE — Progress Notes (Signed)
  [facility] Adult Case Management Discharge Plan :  Will you be returning to the same living situation after discharge:  Yes,  home. At discharge, do you have transportation home?: Yes,  Lyft/Safe Transportation. Do you have the ability to pay for your medications: Yes,  Medicaid.  Release of information consent forms completed and in the chart.  Patient to Follow up at: Follow-up Information    [facility] [employer]. Call.   Specialty: Professional Counselor Why: Please call to schedule a therapy appointment. Contact information: [facility] [place] [address] [place] [address] [phone]        [facility] Follow up.   Contact information: [address]     [address] [place] [address] [phone]          Next level of care provider has access to [facility] Link:no  Safety Planning and Suicide Prevention discussed: Yes,  with brother, Mamoni     Has patient been referred to the Quitline?: N/A patient is not a smoker  Patient has been referred for addiction treatment: Yes  Darreld Mclean, LCSWA 07/01/2019, 3:25 PM
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
## 2019-07-01 NOTE — BHH Counselor (Signed)
CSW received a call from [facility] Haze Boyden [phone]).   Care coordinator reports patient was last seen at [facility] for outpatient therapy on 06/11/2019. Care coordinator would like to be updated of disposition.  Enid Cutter, MSW, LCSW-A Clinical Social Worker [facility] Adult Unit
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
## 2019-07-02 LAB — RAPID URINE DRUG SCREEN, HOSP PERFORMED
Amphetamines: NOT DETECTED
Barbiturates: NOT DETECTED
Benzodiazepines: NOT DETECTED
Cocaine: NOT DETECTED
Opiates: NOT DETECTED
Tetrahydrocannabinol: NOT DETECTED

## 2019-07-02 LAB — RESPIRATORY PANEL BY RT PCR (FLU A&B, COVID)
Influenza A by PCR: NEGATIVE
Influenza B by PCR: NEGATIVE
SARS Coronavirus 2 by RT PCR: NEGATIVE

## 2019-07-02 MED ORDER — LEVETIRACETAM 500 MG PO TABS
500.0000 mg | ORAL_TABLET | Freq: Two times a day (BID) | ORAL | Status: DC
  Administered 2019-07-02: 500 mg via ORAL
  Filled 2019-07-02 (×2): qty 1

## 2019-07-02 NOTE — ED Notes (Addendum)
Pt remained calm.  Sister lalia given # of DSS social worker working on case. Family is aware pt is coming home in taxi.
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
## 2019-07-02 NOTE — Progress Notes (Signed)
Nira Conn, NP recommends inpt tx. TTS to seek placement. EDP Muthersbaugh, Boyd Kerbs and Julieanne Cotton, RN have been advised.

## 2019-07-02 NOTE — Discharge Instructions (Signed)
Follow-up with primary doctor, primary psychiatric team.  Return for any aggressive behavior, homicidal behavior, suicidal thoughts or attempts to hurt others.

## 2019-07-02 NOTE — ED Notes (Signed)
Pt eating lunch.  Sitting calmly.
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
## 2019-07-02 NOTE — ED Notes (Signed)
Breakfast Ordered 

## 2019-07-02 NOTE — Progress Notes (Signed)
TTS connecting to [facility] interpreter.

## 2019-07-02 NOTE — Progress Notes (Signed)
TTS spoke with Julieanne Cotton, RN and requested IVC be faxed to [facility] for review, IVC not yet received.

## 2019-07-02 NOTE — ED Notes (Signed)
Placed video machine in pt room for TTS assessment.
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
## 2019-07-02 NOTE — Progress Notes (Signed)
Patient meets criteria for inpatient treatment per Nira Conn, NP. No appropriate beds at [facility] currently. CSW faxed referrals to the following facilities for review:  [facility]  [facility]  [facility]  [facility]   [facility]  [facility]  [facility]  [facility]  [facility]  [facility]    TTS will continue to seek bed placement.     Ruthann Cancer MSW, [facility] Clincal Social Worker Disposition  [facility] Ph: [phone] Fax: [phone] 07/02/2019 8:40 AM

## 2019-07-02 NOTE — ED Notes (Signed)
Translator used for all interactions with patient. Informed pt of need for urine sample.

## 2019-07-05 ENCOUNTER — Telehealth: Payer: Self-pay

## 2019-07-05 NOTE — Telephone Encounter (Signed)
I have called Ms Taffy and spoke with her sister in law to arrange a meeting  together with [name] behavioral health RN. Same scheduled for tomorrow @ 1030 am.I will arrange for transportation to and from their residence. Jihan Rudy RN BSn PCCn [phone]-office [phone]-cell

## 2019-07-06 ENCOUNTER — Encounter: Payer: Self-pay | Admitting: Pediatric Intensive Care
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
## 2019-07-07 ENCOUNTER — Telehealth: Payer: Self-pay | Admitting: Family Medicine

## 2019-07-07 NOTE — Telephone Encounter (Signed)
Discussed case with Congregational Nurses. Patricia [name]'s family would like to consider residential placement. [facility], requested evaluation and emergency placement. Patient already has a Care Coordinator-- Patricia [name]--- [phone].  [name], MD  Family Medicine Teaching Service

## 2019-07-08 NOTE — Telephone Encounter (Signed)
Spoke with Patricia [name]--[phone].  She reports she does not know of any emergency placement facilities. Discussed case. She will look for Psychiatrist for [name].   Patricia Stanley agreed to conduct assessment. Will connect with RN [name]. All questions answered. Asked her to call back with updates regarding placement, psychiatry etc.   [name], MD  [facility] Medicine Teaching Service

## 2019-07-11 ENCOUNTER — Ambulatory Visit (INDEPENDENT_AMBULATORY_CARE_PROVIDER_SITE_OTHER): Payer: Medicaid Other | Admitting: Family Medicine

## 2019-07-11 ENCOUNTER — Other Ambulatory Visit: Payer: Self-pay

## 2019-07-11 ENCOUNTER — Encounter: Payer: Self-pay | Admitting: Family Medicine

## 2019-07-11 VITALS — BP 98/62 | HR 63 | Wt 191.6 lb

## 2019-07-11 DIAGNOSIS — G40909 Epilepsy, unspecified, not intractable, without status epilepticus: Secondary | ICD-10-CM | POA: Diagnosis not present

## 2019-07-11 DIAGNOSIS — R7309 Other abnormal glucose: Secondary | ICD-10-CM

## 2019-07-11 DIAGNOSIS — R739 Hyperglycemia, unspecified: Secondary | ICD-10-CM

## 2019-07-11 LAB — POCT GLYCOSYLATED HEMOGLOBIN (HGB A1C): Hemoglobin A1C: 4.9 % (ref 4.0–5.6)

## 2019-07-11 NOTE — Patient Instructions (Addendum)
It was wonderful to see you today.  Please bring ALL of your medications with you to every visit.   Thank you for choosing [facility].   Please call [phone] with any questions about today's appointment.  Please be sure to schedule follow up at the front  desk before you leave today.   [name], MD  Family Medicine   Please schedule follow up in July to check in

## 2019-07-11 NOTE — Assessment & Plan Note (Signed)
Noted in history although no labs on file we will repeat serologies at follow-up.

## 2019-07-11 NOTE — Assessment & Plan Note (Signed)
No seizures since last visit
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
## 2019-07-12 ENCOUNTER — Emergency Department (HOSPITAL_COMMUNITY)
Admission: EM | Admit: 2019-07-12 | Discharge: 2019-07-13 | Disposition: A | Discharge status: Other Acute Inpatient Hospital | Payer: Medicaid Other | Attending: Emergency Medicine | Admitting: Emergency Medicine

## 2019-07-12 ENCOUNTER — Observation Stay (HOSPITAL_COMMUNITY)
Admission: RE | Admit: 2019-07-12 | Discharge: 2019-07-12 | Disposition: A | Discharge status: Other Acute Inpatient Hospital | Payer: Medicaid Other | Attending: Psychiatry | Admitting: Psychiatry

## 2019-07-12 ENCOUNTER — Other Ambulatory Visit: Payer: Self-pay

## 2019-07-12 DIAGNOSIS — Z9114 Patient's other noncompliance with medication regimen: Secondary | ICD-10-CM | POA: Insufficient documentation

## 2019-07-12 DIAGNOSIS — Z79899 Other long term (current) drug therapy: Secondary | ICD-10-CM | POA: Diagnosis not present

## 2019-07-12 DIAGNOSIS — G809 Cerebral palsy, unspecified: Secondary | ICD-10-CM | POA: Diagnosis not present

## 2019-07-12 DIAGNOSIS — R569 Unspecified convulsions: Secondary | ICD-10-CM | POA: Insufficient documentation

## 2019-07-12 DIAGNOSIS — I10 Essential (primary) hypertension: Secondary | ICD-10-CM | POA: Insufficient documentation

## 2019-07-12 DIAGNOSIS — Z20822 Contact with and (suspected) exposure to covid-19: Secondary | ICD-10-CM | POA: Insufficient documentation

## 2019-07-12 DIAGNOSIS — F4325 Adjustment disorder with mixed disturbance of emotions and conduct: Secondary | ICD-10-CM | POA: Diagnosis not present

## 2019-07-12 LAB — SARS CORONAVIRUS 2 BY RT PCR (HOSPITAL ORDER, PERFORMED IN ~~LOC~~ HOSPITAL LAB): SARS Coronavirus 2: NEGATIVE

## 2019-07-12 LAB — GLUCOSE, CAPILLARY: Glucose-Capillary: 97 mg/dL (ref 70–99)

## 2019-07-12 MED ORDER — LEVETIRACETAM 500 MG PO TABS: 500.0000 mg | ORAL_TABLET | Freq: Two times a day (BID) | ORAL | Status: DC
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
## 2019-07-12 NOTE — Progress Notes (Signed)
MHT Natalia Leatherwood witnessed pt seizing approximately 1 minute.  ABC's intact at present.  Airway maintained, skin color good.  Pt postictal at present.  Code called, staff at bedside. NP Ada at bedside to eval pt.  CBG obtained-97.  Pt awake, alert & responsive at present.  Not seizing at present. 911 called to transport pt to [facility] for further evaluation.  AC Kim & AC Tosin contacted, [facility].

## 2019-07-12 NOTE — ED Triage Notes (Signed)
29 yo pt BIBA  From [facility] status post seizure. BH staff witness tail end of seizure approx 2 minutes. Pt has history of seizure disorder. It is reported that pt typically has multiple seizures a day bc of medication non compliance. PT did not seize en route with EMS. Pt speaks Swahili    Vitals: cbg 99 spo2 100 on room air HR 100 bp 100/61 rr 16

## 2019-07-13 ENCOUNTER — Other Ambulatory Visit: Payer: Self-pay

## 2019-07-13 ENCOUNTER — Observation Stay (HOSPITAL_COMMUNITY)
Admission: AD | Admit: 2019-07-13 | Discharge: 2019-07-13 | Disposition: A | Discharge status: Home / Self Care | Payer: Medicaid Other | Source: Home / Self Care | Attending: Psychiatry | Admitting: Psychiatry

## 2019-07-13 ENCOUNTER — Encounter (HOSPITAL_COMMUNITY): Payer: Self-pay | Admitting: Psychiatry

## 2019-07-13 DIAGNOSIS — F4320 Adjustment disorder, unspecified: Secondary | ICD-10-CM | POA: Diagnosis present

## 2019-07-13 DIAGNOSIS — F79 Unspecified intellectual disabilities: Secondary | ICD-10-CM | POA: Diagnosis not present

## 2019-07-13 LAB — CBC WITH DIFFERENTIAL/PLATELET
Abs Immature Granulocytes: 0.02 10*3/uL (ref 0.00–0.07)
Basophils Absolute: 0 10*3/uL (ref 0.0–0.1)
Basophils Relative: 0 %
Eosinophils Absolute: 0.3 10*3/uL (ref 0.0–0.5)
Eosinophils Relative: 4 %
HCT: 36.2 % (ref 36.0–46.0)
Hemoglobin: 11.4 g/dL — ABNORMAL LOW (ref 12.0–15.0)
Immature Granulocytes: 0 %
Lymphocytes Relative: 27 %
Lymphs Abs: 2.2 10*3/uL (ref 0.7–4.0)
MCH: 25.6 pg — ABNORMAL LOW (ref 26.0–34.0)
MCHC: 31.5 g/dL (ref 30.0–36.0)
MCV: 81.3 fL (ref 80.0–100.0)
Monocytes Absolute: 0.6 10*3/uL (ref 0.1–1.0)
Monocytes Relative: 8 %
Neutro Abs: 5.2 10*3/uL (ref 1.7–7.7)
Neutrophils Relative %: 61 %
Platelets: 239 10*3/uL (ref 150–400)
RBC: 4.45 MIL/uL (ref 3.87–5.11)
RDW: 13.9 % (ref 11.5–15.5)
WBC: 8.4 10*3/uL (ref 4.0–10.5)
nRBC: 0 % (ref 0.0–0.2)

## 2019-07-13 LAB — COMPREHENSIVE METABOLIC PANEL
ALT: 12 U/L (ref 0–44)
AST: 17 U/L (ref 15–41)
Albumin: 3.6 g/dL (ref 3.5–5.0)
Alkaline Phosphatase: 54 U/L (ref 38–126)
Anion gap: 9 (ref 5–15)
BUN: 13 mg/dL (ref 6–20)
CO2: 21 mmol/L — ABNORMAL LOW (ref 22–32)
Calcium: 9 mg/dL (ref 8.9–10.3)
Chloride: 109 mmol/L (ref 98–111)
Creatinine, Ser: 0.79 mg/dL (ref 0.44–1.00)
GFR calc Af Amer: >60 mL/min (ref >60)
GFR calc non Af Amer: >60 mL/min (ref >60)
Glucose, Bld: 108 mg/dL — ABNORMAL HIGH (ref 70–99)
Potassium: 3.6 mmol/L (ref 3.5–5.1)
Sodium: 139 mmol/L (ref 135–145)
Total Bilirubin: 0.3 mg/dL (ref 0.3–1.2)
Total Protein: 6.8 g/dL (ref 6.5–8.1)

## 2019-07-13 LAB — ETHANOL: Alcohol, Ethyl (B): <10 mg/dL (ref <10)

## 2019-07-13 LAB — I-STAT BETA HCG BLOOD, ED (MC, WL, AP ONLY): I-stat hCG, quantitative: <5 m[IU]/mL (ref <5)

## 2019-07-13 MED ORDER — LEVETIRACETAM IN NACL 1000 MG/100ML IV SOLN
1000.0000 mg | Freq: Once | INTRAVENOUS | Status: AC
  Administered 2019-07-13: 1000 mg via INTRAVENOUS
  Filled 2019-07-13: qty 100

## 2019-07-13 MED ORDER — ACETAMINOPHEN 325 MG PO TABS: 650.0000 mg | ORAL_TABLET | Freq: Four times a day (QID) | ORAL | Status: DC | PRN

## 2019-07-13 MED ORDER — ALUM & MAG HYDROXIDE-SIMETH 200-200-20 MG/5ML PO SUSP: 30.0000 mL | ORAL | Status: DC | PRN

## 2019-07-13 MED ORDER — MAGNESIUM HYDROXIDE 400 MG/5ML PO SUSP: 30.0000 mL | Freq: Every day | ORAL | Status: DC | PRN
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
## 2019-07-13 NOTE — ED Notes (Signed)
Report given to Latricia RN and Safe transport called for transportation to [facility] 

## 2019-07-13 NOTE — Plan of Care (Signed)
[facility] Observation Crisis Plan  Reason for Crisis Plan:  Crisis Stabilization   Plan of Care:  Referral for Inpatient Hospitalization  Family Support:      Current Living Environment:     Insurance:   Hospital Account    Name Acct ID Class Status Primary Coverage   Patricia [name], Patricia [name] [id number] BEHAVIORAL HEALTH OBSERVATION [place]        Guarantor Account (for Hospital Account [id number])    Name Relation to Pt Service Area Active? Acct Type   Patricia [name] Self [facility] Yes Behavioral Health   Address Phone       [address] [place], [place] [address] [phone](H)          Coverage Information (for Hospital Account [id number])    F/O Payor/Plan Precert #   [facility] [place]    Subscriber Subscriber #   Patricia [name], Patricia [name] [id number] [facility]   Address Phone   [address] [place], [place] [address] [phone]      Legal Guardian:     Primary Care Provider:  Westley Chandler, MD  Current Outpatient Providers:  Patricia Chandler, MD  Psychiatrist:     Counselor/Therapist:     Compliant with Medications:  No  Additional Information:   Patricia [name] 5/19/20215:10 AM
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
## 2019-07-13 NOTE — ED Notes (Signed)
Security handed this nurse a brown paper bag with belongings for pt transferred from [facility]. Pt belongings labeled and placed in pt cabinet for rooms 5-8. Items  In bag unknown as bag is stapled shut/

## 2019-07-13 NOTE — Progress Notes (Signed)
  COVID-19 Daily Checkoff  Have you had a fever (temp > 37.80C/100F)  in the past 24 hours?  No  If you have had runny nose, nasal congestion, sneezing in the past 24 hours, has it worsened? No  COVID-19 EXPOSURE  Have you traveled outside the state in the past 14 days? No  Have you been in contact with someone with a confirmed diagnosis of COVID-19 or PUI in the past 14 days without wearing appropriate PPE? No  Have you been living in the same home as a person with confirmed diagnosis of COVID-19 or a PUI (household contact)? No  Have you been diagnosed with COVID-19? No              Patient A&O x 3, ambulatory. Patient speaks Swahili. Limited information given during interview but patient denies intent to harm self/others. Patient observed standing in doorway staring out. Denies concerns. Patient discharged in no acute distress. Patient denied SI/HI, A/VH upon discharge. Patient verbalized understanding of all discharge instructions explained by staff via patient's sister as translator, to include follow up care, RX's and safety plan. Pt belongings returned to patient from locker #2     intact. Patient discharged to lobby with family transport. Goals met, safety maintained.
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
## 2019-07-13 NOTE — BH Assessment (Signed)
[facility] Assessment Progress Note  Per Landry Mellow, MD, this pt does not require psychiatric hospitalization at this time.  Pt is to be discharged from the [facility] Observation Unit with recommendation to follow up with [facility] [place], and to stay in contact with Renard Matter, pt's [facility].  This has been included in pt's discharge instructions.  Pt's nurse, Nicolette Bang, has been notified.  Doylene Canning, MA Triage Specialist [phone]

## 2019-07-13 NOTE — Discharge Summary (Signed)
Physician observation admission/discharge Summary Note  Patient:  Patricia [name] is an 29 y.o., female MRN:  [id number] DOB:  [birth year] Patient phone:  [phone] (home)  Patient address:   [address] [place] [address],  Total Time spent with patient: 45 minutes  Date of Admission:  07/13/2019 Date of Discharge: 07/13/2019  Reason for Admission: Behavior  Principal Problem: <principal problem not specified> Discharge Diagnoses: Active Problems:   Adjustment disorder   Past Psychiatric History: Patient has only been admitted to the psychiatric hospital on 1 occasion.  That was 06/29/2019.  She has probably 20 visits to the emergency room because of behavioral issues with the patient and her parents.  She has developmental disorder.  She has a history of a seizure disorder as well as cerebral palsy.  Of note, Adult Protective Services and child protective services have been involved with this patient on previous occasions.  Past Medical History:  Past Medical History:  Diagnosis Date  . Cerebral palsy (HCC)   . Hypertension   . Seizures (HCC)   . Suicidal behavior    History reviewed. No pertinent surgical history. Family History:  Family History  Problem Relation Age of Onset  . Hepatitis Brother    Family Psychiatric  History: Noncontributory Social History:  Social History   Substance and Sexual Activity  Alcohol Use Yes  . Alcohol/week: 2.0 standard drinks  . Types: 2 Cans of beer per week   Comment: occasional alcohol use     Social History   Substance and Sexual Activity  Drug Use Never    Social History   Socioeconomic History  . Marital status: Single    Spouse name: Not on file  . Number of children: 1  . Years of education: Not on file  . Highest education level: Not on file  Occupational History    Comment: none  Tobacco Use  . Smoking status: Never Smoker  . Smokeless tobacco: Never Used  Substance and Sexual Activity  . Alcohol use:  Yes    Alcohol/week: 2.0 standard drinks    Types: 2 Cans of beer per week    Comment: occasional alcohol use  . Drug use: Never  . Sexual activity: Not Currently  Other Topics Concern  . Not on file  Social History Narrative   ** Merged History Encounter **       ** Merged History Encounter **       ** Merged History Encounter **       Lives with mother, family   Social Determinants of Health   Financial Resource Strain:   . Difficulty of Paying Living Expenses:   Food Insecurity:   . Worried About Programme researcher, broadcasting/film/video in the Last Year:   . Barista in the Last Year:   Transportation Needs:   . Freight forwarder (Medical):   Marland Kitchen Lack of Transportation (Non-Medical):   Physical Activity:   . Days of Exercise per Week:   . Minutes of Exercise per Session:   Stress:   . Feeling of Stress :   Social Connections:   . Frequency of Communication with Friends and Family:   . Frequency of Social Gatherings with Friends and Family:   . Attends Religious Services:   . Active Member of Clubs or Organizations:   . Attends Banker Meetings:   Marland Kitchen Marital Status:     Hospital Course: Patient is seen and examined.  Patient is a 29 year old female with  a past psychiatric history significant for an intellectual developmental disability who presented originally to the behavioral health hospital, but then had a seizure and was transferred to the emergency department at [facility].  She has a history of seizure disorder, and has been on Keppra but has been noted to have terrible compliance.  She had originally been here because [facility] police found her sitting in the middle of traffic.  The patient has an ongoing problem with her family.  She believes that they talk poorly to her, and tell her to do things.  Because of her developmental disability her reactions are quite infantile.  On her last hospitalization here she was watched for 3 days.  There is  no evidence of any psychosis or other behavioral problems.  Social work at that time had many experiences with the patient and their family in the emergency department.  There had been an Adult Protective Services case open, but that apparently been closed.  On her hospitalization here it discovered that she had a 15-67-year-old child, and child protective services was called to notify them about concern for this patient caring for her child.  It is felt that the majority of these issues have to do with social circumstances with the inability of her family to cope with the patient's intellectual disability, and the difficulty the patient has interacting with her family and coping with the general operations of life.  Social work has been notified from the beginning, and has notified Adult Scientist, forensic and child protective services.  It is felt that the patient would be of better benefit to be in some kind of facility for intellectually challenged folks rather than continue to be in her home with her family.  She is not suicidal, homicidal or psychotic currently.  It does not appear that any psychiatric medication or admission would be of any benefit to her current problem.  She will be discharged today.  Physical Findings: AIMS: Facial and Oral Movements Muscles of Facial Expression: None, normal Lips and Perioral Area: None, normal Jaw: None, normal Tongue: None, normal,Extremity Movements Upper (arms, wrists, hands, fingers): None, normal Lower (legs, knees, ankles, toes): None, normal, Trunk Movements Neck, shoulders, hips: None, normal, Overall Severity Severity of abnormal movements (highest score from questions above): None, normal Incapacitation due to abnormal movements: None, normal Patient's awareness of abnormal movements (rate only patient's report): No Awareness, Dental Status Current problems with teeth and/or dentures?: No Does patient usually wear dentures?: No  CIWA:  CIWA-Ar  Total: 0 COWS:  COWS Total Score: 1  Musculoskeletal: Strength & Muscle Tone: within normal limits Gait & Station: normal Patient leans: N/A  Psychiatric Specialty Exam: Physical Exam  Review of Systems  Blood pressure (!) 101/47, pulse 82, temperature 97.7 F (36.5 C), temperature source Oral, resp. rate 18, height 5\' 2"  (1.575 m), weight 86.9 kg, SpO2 98 %.Body mass index is 35.04 kg/m.  General Appearance: Casual  Eye Contact:  Fair  Speech:  Normal Rate  Volume:  Decreased  Mood:  Euthymic  Affect:  Congruent  Thought Process:  Goal Directed and Descriptions of Associations: Circumstantial  Orientation:  Negative  Thought Content:  Rumination  Suicidal Thoughts:  No  Homicidal Thoughts:  No  Memory:  Immediate;   Poor Recent;   Poor Remote;   Poor  Judgement:  Impaired  Insight:  Lacking  Psychomotor Activity:  Normal  Concentration:  Concentration: Fair and Attention Span: Fair  Recall:  [employer]  of Knowledge:  Poor  Language:  Fair  Akathisia:  Negative  Handed:  Right  AIMS (if indicated):     Assets:  Desire for Improvement Resilience  ADL's:  Intact  Cognition:  Impaired,  Moderate  Sleep:           Has this patient used any form of tobacco in the last 30 days? (Cigarettes, Smokeless Tobacco, Cigars, and/or Pipes) Yes, No  Blood Alcohol level:  Lab Results  Component Value Date   ETH <10 07/13/2019   ETH <10 07/01/2019    Metabolic Disorder Labs:  Lab Results  Component Value Date   HGBA1C 4.9 07/11/2019   MPG 105.41 06/30/2019   No results found for: PROLACTIN Lab Results  Component Value Date   CHOL 129 06/30/2019   TRIG 55 06/30/2019   HDL 38 (L) 06/30/2019   CHOLHDL 3.4 06/30/2019   VLDL 11 06/30/2019   LDLCALC 80 06/30/2019    See Psychiatric Specialty Exam and Suicide Risk Assessment completed by Attending Physician prior to discharge.  Discharge destination:  Home  Is patient on multiple antipsychotic therapies at  discharge:  No   Has Patient had three or more failed trials of antipsychotic monotherapy by history:  No  Recommended Plan for Multiple Antipsychotic Therapies: NA  Discharge Instructions    Diet - low sodium heart healthy   Complete by: As directed    Increase activity slowly   Complete by: As directed      Allergies as of 07/13/2019   No Known Allergies     Medication List    TAKE these medications     Indication  levETIRAcetam 500 MG tablet Commonly known as: Keppra Take 1 tablet (500 mg total) by mouth 2 (two) times daily.  Indication: Seizure        Follow-up recommendations:  Activity:  ad lib  Comments: Social work has attempted to contact the family to pick up the patient.  Social services has been notified.  Signed: Antonieta Pert, MD 07/13/2019, 4:03 PM

## 2019-07-13 NOTE — Progress Notes (Signed)
Patient ID: Patricia [name], female   DOB: 03-Apr-1990, 29 y.o.   MRN: [id number]  Patient is seen and examined.  Patient is a 29 year old female familiar to me from her previous hospitalization as well as multiple emergency room visits who originally presented to the [facility] emergency department after being found sitting in the road.  She also had apparently had a seizure.  She has a history of a developmental disorder as well as cerebral palsy.  She only speaks Swahili, and this morning attempts to communicate with her through the electronic translator were not fruitful.  The patient does not require psychiatric hospitalization, but the patient does require an intervention from a suicidal standpoint.  When she is asked to do something in her home she becomes oppositional, and acts out.  Multiple attempts to contact Adult Protective Services have not been fruitful.  She is also apparently the mother of 35-year-old, and most recently social work contacted protective services to assess whether or not she was able to care for the child.  She has been in the emergency room on double-digit occasions in a short period of time.  I really believe that the state needs to step in and protect the patient from herself and her family given her developmental and cognitive disorders.  While hospitalized here last month she remained on the unit.  She was safe, had no periods of agitation, and remained to her self and was appropriate.  At least from an adult general psychiatric standpoint there is really no medications that would be of benefit to this patient at this point.  I will wait till social work evaluates the situation (again) and the plan will be to discharge the patient home.

## 2019-07-13 NOTE — Discharge Instructions (Addendum)
For your behavioral health needs you are advised to follow up with [facility].  New patients are seen at their walk-in clinic.  Walk-in hours are Monday - Friday from 8:30 am - 12:00 pm, and from 1:00 pm - 2:30 pm.  Walk-in patients are seen on a first come, first served basis, so try to arrive as early as possible for the best chance of being seen the same day:       [facility]      [address]      [place], [place] [address]      [phone]  You are also advised to stay in contact with Renard Matter, you [facility] Coordinator:       Renard Matter      [phone]

## 2019-07-13 NOTE — Progress Notes (Signed)
Ada NP was made aware that pt has returned from [facility] for medical clearance  post her seizure and was given 1000mg  of Keppra IV. No new orders given at this time.

## 2019-07-13 NOTE — Progress Notes (Signed)
Supervisor spoke with:  Lannette Donath Sister   [phone]   Supervisor updated her that pt will not be admitted to the hospital and should continue with Renard Matter as her care coordinator.  Sister said she thought pt would be admitted but agreed to come pick pt up, supervisor texted her the address.  Obs unit informed.  Supervisor had left message with Renard Matter from [place] earlier.  No response received.  Second message left regarding pt discharge. Garner Nash, MSW, LCSW Advanced Care Supervisor 07/13/2019 4:18 PM

## 2019-07-13 NOTE — ED Provider Notes (Signed)
Care assumed from Dr. Silverio Lay.  Patient from [facility] after having a seizure.  She is getting a Keppra loading dose and labs will be checked.  When these are completed she can be transferred back.  Patient received IV Keppra without incident.  Her labs are reassuring with normal electrolytes.  Through interpreter, she states she feels fine and denies any headache or fever.  It is unclear whether she was receiving the Keppra at behavioral health Hospital. She appears stable to return to [facility].  BP (!) 100/50   Pulse 94   Temp 97.8 F (36.6 C) (Oral)   Resp (!) 23   SpO2 100%     Glynn Octave, MD 07/13/19 [phone]

## 2019-07-27 ENCOUNTER — Telehealth: Payer: Self-pay | Admitting: Family Medicine

## 2019-07-27 NOTE — Telephone Encounter (Signed)
Patricia [name], from [facility]  states she is concerned about the pt's safety and mental health. She had requested medical records in May and not received.  Her ph # is [phone]. She can be reached after 5 pm. Desk ph # is [phone].

## 2019-07-28 NOTE — Telephone Encounter (Signed)
Spoke with Dalene Carrow about APS/CPS case. Gave her brief update on most recent clinical events and diagnoses. She is hoping to help family and patient, provided number to Congregational Nurse Office to help coordinate care.   [name], MD  Family Medicine Teaching Service

## 2019-07-30 NOTE — Telephone Encounter (Signed)
Thank you. I will also try to connect with [place] on Tuesday or Wednesday by phone or in person visit just to check on her,

## 2019-08-03 ENCOUNTER — Telehealth: Payer: Self-pay

## 2019-08-03 NOTE — Telephone Encounter (Signed)
I have called pharmacy on file and refilled Keppra. Patient  Sister in law informed that the medication is ready for pick up. Jaimey Franchini RN BSn PCCn  [phone]-office [phone]-cell

## 2019-08-03 NOTE — Telephone Encounter (Signed)
°  I spoke with patient`s brother and sister in law via telephone. The conversation was in [place].[facility] [place] [facility] Behavioral health RN with the [facility] congregational nurse program was present.) Family reported that Ms benison is doing well and taking medication as prescribed.However, she has  Keppra for the next 2 days only and I will follow up for a refill.I also enquired about her immigration status in order to identify resources available for permanent residents.She does not have a green card but the family will get in touch with the her case worker. I will follow up in the coming week with the family to see if a home visit with myself and behavioral health nurse to educated the family regarding  Ms roslind michaux health needs including compliance with medication and doctors appointments.  D[employer]elle [employer] RN BSn PCCn [phone]-cell [phone]-office

## 2019-08-15 ENCOUNTER — Telehealth: Payer: Self-pay

## 2019-08-15 NOTE — Telephone Encounter (Signed)
I called Ms Everly to check on her.Unfortunately she did not pick up keppra.I have called pharmacy and it will be ready for pick up this afternoon. I have informed Ms Bolds sister in law and she will be go to pharmacy tomorrow morning. I will call her again tomorrow to remind her. Nicole Cella Chuckie Mccathern RN BSN PCCN [phone]-office [phone]-cell

## 2019-08-23 ENCOUNTER — Telehealth: Payer: Self-pay | Admitting: *Deleted

## 2019-08-23 NOTE — Telephone Encounter (Signed)
Pt scheduled and sister in law informed. Keylie Beavers Bruna Potter, CMA

## 2019-08-23 NOTE — Telephone Encounter (Signed)
-----   Message from Westley Chandler, MD sent at 08/22/2019  4:11 PM EDT ----- Regarding: Implanon Removal/Replacement Hi,  Can you please call this patient's sister in law [name]) and help to schedule in [place] for nexplanon removal/replacement?  Thanks, CB

## 2019-08-31 ENCOUNTER — Telehealth: Payer: Self-pay

## 2019-08-31 NOTE — Telephone Encounter (Signed)
I have called and confirmed appointment with [facility] for tomorrow 09/01/19 @0930am . I also called patient`s brother Erial Fikes to remind him of this appointment.  Chrisandra Netters Zaleah Ternes RN BSN PCCn [phone]-office [phone]-cell

## 2019-09-01 ENCOUNTER — Ambulatory Visit: Payer: Medicaid Other

## 2019-09-05 ENCOUNTER — Encounter (HOSPITAL_COMMUNITY): Payer: Self-pay | Admitting: *Deleted

## 2019-09-05 ENCOUNTER — Emergency Department (HOSPITAL_COMMUNITY)
Admission: EM | Admit: 2019-09-05 | Discharge: 2019-09-05 | Disposition: A | Discharge status: Home / Self Care | Payer: Medicaid Other | Attending: Emergency Medicine | Admitting: Emergency Medicine

## 2019-09-05 ENCOUNTER — Other Ambulatory Visit: Payer: Self-pay

## 2019-09-05 DIAGNOSIS — I10 Essential (primary) hypertension: Secondary | ICD-10-CM | POA: Insufficient documentation

## 2019-09-05 DIAGNOSIS — R21 Rash and other nonspecific skin eruption: Secondary | ICD-10-CM | POA: Insufficient documentation

## 2019-09-05 DIAGNOSIS — M791 Myalgia, unspecified site: Secondary | ICD-10-CM | POA: Insufficient documentation

## 2019-09-05 DIAGNOSIS — R419 Unspecified symptoms and signs involving cognitive functions and awareness: Secondary | ICD-10-CM | POA: Diagnosis present

## 2019-09-05 DIAGNOSIS — Z20822 Contact with and (suspected) exposure to covid-19: Secondary | ICD-10-CM | POA: Insufficient documentation

## 2019-09-05 DIAGNOSIS — R45851 Suicidal ideations: Secondary | ICD-10-CM | POA: Diagnosis not present

## 2019-09-05 LAB — I-STAT BETA HCG BLOOD, ED (MC, WL, AP ONLY): I-stat hCG, quantitative: <5 m[IU]/mL (ref <5)

## 2019-09-05 MED ORDER — LEVETIRACETAM 500 MG PO TABS
500.0000 mg | ORAL_TABLET | Freq: Two times a day (BID) | ORAL | Status: DC
  Administered 2019-09-05: 500 mg via ORAL
  Filled 2019-09-05: qty 1

## 2019-09-05 NOTE — ED Triage Notes (Signed)
Patricia [name] # [id number] Language Line  Difficult to determine why pt is here today. BIB [employer] vol  wanting to go back home to [place] due to her brothers harnessing her telling her she is a fool, No smart, My mind no fool. Father is dead. Mother remarried and she does not like her step father. This morning hitting left hand against wall and window pane. The pane broke, she has superficial nicks to her inner wrist. When asked what she is expecting out of this visit. To go home ([place]) and get some clothes. States the clothes she has on is all she has. Never clear if she intends to kill self or how. Uses statements "I just want to die"

## 2019-09-05 NOTE — Progress Notes (Signed)
Consult request has been received. 2nd shift ED CSW attempting to follow up at present time.  CSW will continue to follow for D/C needs.  Dorothe Pea. Ramzi Brathwaite  MSW, LCSW, LCAS, CSI Transitions of Care Clinical Social Worker Care Coordination Department Ph: [phone]
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
## 2019-09-05 NOTE — ED Notes (Signed)
Attemp[ted to call family, but no one spoke [place].

## 2019-09-05 NOTE — ED Notes (Signed)
Swahili interpreter Saphina [phone] used for discharge instructions. Patient was given a bus ticket and shown where the bus stop is.

## 2019-09-05 NOTE — ED Notes (Signed)
BLUE, LT GREEN, LAV SAVE IN MAIN LAB

## 2019-09-05 NOTE — BH Assessment (Signed)
Patient is contracting for safety and is requesting to go home. Case was staffed with Rankin NP who recommended patient be discharged. This Clinical research associate spoke with CSW who is assisting with current needs and arranging transporting.
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
## 2019-09-05 NOTE — Social Work (Signed)
Consult request has been received. CSW attempting to follow up at present time. °  °CSW will continue to follow for dc needs. ° °Laporshia Hogen Tarpley-Carter, MSW, LCSW-A °[facility] °Transitions of Care Clinical Social Worker °[place] °[phone] °

## 2019-09-07 ENCOUNTER — Telehealth: Payer: Self-pay

## 2019-09-07 NOTE — Telephone Encounter (Signed)
I have scheduled appointment for Patricia [name] Thursday 22nd @10 :30am. Transportation will be provided. Client sister in law made aware and verbalized understanding of date and time of appointment. Ishitha Roper RN3 BSN PCCN [phone]-cell [phone]-office

## 2019-09-09 ENCOUNTER — Encounter (HOSPITAL_COMMUNITY): Payer: Self-pay

## 2019-09-09 ENCOUNTER — Emergency Department (HOSPITAL_COMMUNITY)
Admission: EM | Admit: 2019-09-09 | Discharge: 2019-09-10 | Disposition: A | Discharge status: Home / Self Care | Payer: Medicaid Other | Attending: Emergency Medicine | Admitting: Emergency Medicine

## 2019-09-09 DIAGNOSIS — U071 COVID-19: Secondary | ICD-10-CM

## 2019-09-09 DIAGNOSIS — F329 Major depressive disorder, single episode, unspecified: Secondary | ICD-10-CM | POA: Insufficient documentation

## 2019-09-09 DIAGNOSIS — I10 Essential (primary) hypertension: Secondary | ICD-10-CM | POA: Diagnosis not present

## 2019-09-09 DIAGNOSIS — D72819 Decreased white blood cell count, unspecified: Secondary | ICD-10-CM

## 2019-09-09 DIAGNOSIS — R45851 Suicidal ideations: Secondary | ICD-10-CM

## 2019-09-09 DIAGNOSIS — Z79899 Other long term (current) drug therapy: Secondary | ICD-10-CM | POA: Insufficient documentation

## 2019-09-09 LAB — I-STAT BETA HCG BLOOD, ED (MC, WL, AP ONLY): I-stat hCG, quantitative: <5 m[IU]/mL (ref <5)

## 2019-09-09 LAB — CBC
HCT: 38.4 % (ref 36.0–46.0)
Hemoglobin: 11.9 g/dL — ABNORMAL LOW (ref 12.0–15.0)
MCH: 25.3 pg — ABNORMAL LOW (ref 26.0–34.0)
MCHC: 31 g/dL (ref 30.0–36.0)
MCV: 81.5 fL (ref 80.0–100.0)
Platelets: 205 10*3/uL (ref 150–400)
RBC: 4.71 MIL/uL (ref 3.87–5.11)
RDW: 13.9 % (ref 11.5–15.5)
WBC: 2.4 10*3/uL — ABNORMAL LOW (ref 4.0–10.5)
nRBC: 0 % (ref 0.0–0.2)

## 2019-09-09 NOTE — ED Triage Notes (Signed)
Pt bib [employer] as IVC for SI. Per [employer] pt was threatening to run into traffic. Pt has psych hx.

## 2019-09-10 ENCOUNTER — Other Ambulatory Visit: Payer: Self-pay

## 2019-09-10 LAB — COMPREHENSIVE METABOLIC PANEL
ALT: 29 U/L (ref 0–44)
AST: 37 U/L (ref 15–41)
Albumin: 3.6 g/dL (ref 3.5–5.0)
Alkaline Phosphatase: 54 U/L (ref 38–126)
Anion gap: 10 (ref 5–15)
BUN: 8 mg/dL (ref 6–20)
CO2: 21 mmol/L — ABNORMAL LOW (ref 22–32)
Calcium: 8.6 mg/dL — ABNORMAL LOW (ref 8.9–10.3)
Chloride: 104 mmol/L (ref 98–111)
Creatinine, Ser: 0.96 mg/dL (ref 0.44–1.00)
GFR calc Af Amer: >60 mL/min (ref >60)
GFR calc non Af Amer: >60 mL/min (ref >60)
Glucose, Bld: 100 mg/dL — ABNORMAL HIGH (ref 70–99)
Potassium: 3.8 mmol/L (ref 3.5–5.1)
Sodium: 135 mmol/L (ref 135–145)
Total Bilirubin: 0.5 mg/dL (ref 0.3–1.2)
Total Protein: 7.1 g/dL (ref 6.5–8.1)

## 2019-09-10 LAB — ACETAMINOPHEN LEVEL: Acetaminophen (Tylenol), Serum: <10 ug/mL — ABNORMAL LOW (ref 10–30)

## 2019-09-10 LAB — SARS CORONAVIRUS 2 BY RT PCR (HOSPITAL ORDER, PERFORMED IN ~~LOC~~ HOSPITAL LAB): SARS Coronavirus 2: POSITIVE — AB

## 2019-09-10 LAB — SALICYLATE LEVEL: Salicylate Lvl: <7 mg/dL — ABNORMAL LOW (ref 7.0–30.0)

## 2019-09-10 LAB — ETHANOL: Alcohol, Ethyl (B): <10 mg/dL (ref <10)

## 2019-09-10 NOTE — ED Provider Notes (Signed)
I was asked by nursing staff to you discharge this patient after she was cleared by behavioral health.  Patient had initially presented for suicidal ideation, with thoughts of running out into traffic.  She was brought in as an IVC.  I personally reviewed her lab work, she did have some leukopenia of 2.4, and her Covid test was positive.  The patient has no respiratory symptoms, and her saturation is at 100%, and she is not tachypneic.  Patient was educated on quarantine requirements and return precautions discussed.  No other abnormalities noted on her lab work. IVC paperwork rescinded by Dr. Judd Lien.  Overall, the patient is stable for discharge at this time.   Mare Ferrari, PA-C 09/10/19 1811    Geoffery Lyons, MD 09/10/19 2041

## 2019-09-10 NOTE — ED Notes (Signed)
Per [employer], family friend coming to pick up pt.

## 2019-09-10 NOTE — ED Provider Notes (Signed)
[facility] EMERGENCY DEPARTMENT Provider Note   CSN: [id number] Arrival date & time: 09/09/19  2225     History Chief Complaint  Patient presents with  . Suicidal    Patricia [name] is a 29 y.o. female.  The history is limited by a language barrier. No language interpreter was used.  Mental Health Problem Presenting symptoms: depression, suicidal thoughts and suicide attempt   Patient accompanied by:  Law enforcement Degree of incapacity (severity):  Moderate Onset quality:  Gradual Timing:  Constant Progression:  Worsening Chronicity:  Recurrent Context: not alcohol use, not drug abuse, not noncompliant and not recent medication change   Treatment compliance:  Untreated Relieved by:  None tried Worsened by:  Nothing Ineffective treatments:  None tried      Past Medical History:  Diagnosis Date  . Cerebral palsy (HCC)   . Hypertension   . Seizures (HCC)   . Suicidal behavior     Patient Active Problem List   Diagnosis Date Noted  . Adjustment disorder 07/13/2019  . Refugee health examination 07/16/2017  . Language barrier 07/16/2017  . Seizure disorder (HCC) 07/16/2017  . Hepatitis B 07/16/2017  . 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 No pertinent surgical history.   OB History    Gravida  1   Para  1   Term  1   Preterm      AB      Living  1     SAB      TAB      Ectopic      Multiple      Live Births              Family History  Problem Relation Age of Onset  . Hepatitis Brother     Social History   Tobacco Use  . Smoking status: Never Smoker  . Smokeless tobacco: Never Used  Vaping Use  . Vaping Use: Unknown  Substance Use Topics  . Alcohol use: Yes    Alcohol/week: 2.0 standard drinks    Types: 2 Cans of beer per week    Comment: occasional alcohol use  . Drug use: Never    Home Medications Prior to Admission medications    Medication Sig Start Date End Date Taking? Authorizing Provider  levETIRAcetam (KEPPRA) 500 MG tablet Take 1 tablet (500 mg total) by mouth 2 (two) times daily. 06/03/19   Westley Chandler, MD    Allergies    Patient has no known allergies.  Review of Systems   Review of Systems  Psychiatric/Behavioral: Positive for suicidal ideas.  All other systems reviewed and are negative.   Physical Exam Updated Vital Signs BP 100/74 (BP Location: Right Arm)   Pulse 98   Temp 98.3 F (36.8 C) (Oral)   Resp 16   SpO2 100%   Physical Exam Vitals and nursing note reviewed.  Constitutional:      Appearance: She is well-developed.  HENT:     Head: Normocephalic and atraumatic.     Mouth/Throat:     Mouth: Mucous membranes are moist.     Pharynx: Oropharynx is clear.  Cardiovascular:     Rate and Rhythm: Normal rate and regular rhythm.  Pulmonary:     Effort: No respiratory distress.     Breath sounds: No stridor.  Abdominal:     General: There is no distension.  Musculoskeletal:  General: No swelling or tenderness. Normal range of motion.     Cervical back: Normal range of motion.  Skin:    General: Skin is warm and dry.  Neurological:     General: No focal deficit present.     Mental Status: She is alert.     ED Results / Procedures / Treatments   Labs (all labs ordered are listed, but only abnormal results are displayed) Labs Reviewed  COMPREHENSIVE METABOLIC PANEL - Abnormal; Notable for the following components:      Result Value   CO2 21 (*)    Glucose, Bld 100 (*)    Calcium 8.6 (*)    All other components within normal limits  SALICYLATE LEVEL - Abnormal; Notable for the following components:   Salicylate Lvl <7.0 (*)    All other components within normal limits  ACETAMINOPHEN LEVEL - Abnormal; Notable for the following components:   Acetaminophen (Tylenol), Serum <10 (*)    All other components within normal limits  CBC - Abnormal; Notable for the  following components:   WBC 2.4 (*)    Hemoglobin 11.9 (*)    MCH 25.3 (*)    All other components within normal limits  ETHANOL  RAPID URINE DRUG SCREEN, HOSP PERFORMED  I-STAT BETA HCG BLOOD, ED ([facility], [facility], [facility] ONLY)    EKG None  Radiology No results found.  Procedures Procedures (including critical care time)  Medications Ordered in ED Medications - No data to display  ED Course  I have reviewed the triage vital signs and the nursing notes.  Pertinent labs & imaging results that were available during my care of the patient were reviewed by me and considered in my medical decision making (see chart for details).    MDM Rules/Calculators/A&P                          Suicidal attempt by running into traffic. IVC'ed by [employer], first exam completed. Medically cleared for tts.   Final Clinical Impression(s) / ED Diagnoses Final diagnoses:  None    Rx / DC Orders ED Discharge Orders    None       Harnoor Kohles, Barbara Cower, MD 09/10/19 [phone]

## 2019-09-10 NOTE — ED Notes (Addendum)
Pt psych cleared per [facility] - SW attempting to reach pt's family - so can transport pt home.

## 2019-09-10 NOTE — ED Notes (Signed)
Message sent to Trudee Grip, APP, notifying of pt being d/c'd to home and lab called -  COVID positive.

## 2019-09-10 NOTE — ED Notes (Signed)
SW attempting to reach pt's family unsuccessfully. [employer] to go to pt's home to verify family present - to inquire if will come pick up pt or if need to send pt home via taxi.

## 2019-09-10 NOTE — BHH Counselor (Signed)
Disposition:   Case was staffed with Rankin NP who recommended patient be discharged.

## 2019-09-10 NOTE — Discharge Instructions (Addendum)
Kazi yako ya maabara ilionyesha idadi ndogo ya seli nyeupe za damu ambayo inaweza kudhoofisha uwezo wako wa kupambana na maambukizo. [place] fuatilia na daktari wako wa huduma ya msingi ili hii itathminiwe zaidi. Tafadhali fuatilia rasilimali za magonjwa ya akili zinazotolewa [place]. Rudi kwa ER ikiwa dalili zako zinazidi [place].   Jaribio lako la Covid leo ni [place]. [place] kujiteng[place] kwa angalau siku 7 hadi 10. [address] kunywa maji mengi, pumzika. Rudi kwa ER ikiwa una shida kupumua, au dalili [place].

## 2019-09-10 NOTE — ED Notes (Signed)
Belongings placed in locker 11 
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
## 2019-09-10 NOTE — Progress Notes (Signed)
TTS attempted to engage patient in assessment with assistance of Swahili interpreter. Patient would not wake up or respond to clinician or interpreter. TTS will follow up at a later time.  Darreld Mclean, LCSWA

## 2019-09-10 NOTE — ED Notes (Signed)
Pt's lunch ordered 

## 2019-09-10 NOTE — ED Notes (Signed)
IVC paperwork rescinded by Dr Judd Lien - Copy faxed to [facility] - Copy sent to Medical Records - Original placed in folder for [employer].

## 2019-09-11 ENCOUNTER — Telehealth: Payer: Self-pay | Admitting: Physician Assistant

## 2019-09-11 NOTE — Telephone Encounter (Signed)
Called to discuss with patient about Covid symptoms and the use of bamlanivimab/etesevimab or casirivimab/imdevimab, a monoclonal antibody infusion for those with mild to moderate Covid symptoms and at a high risk of hospitalization.  Pt is qualified for this infusion at the [facility] due to [employer] to sister. Pt is sleeping. We will call back later with a Swahili interpreter.  Cline Crock PA-C  [facility]

## 2019-09-13 ENCOUNTER — Telehealth: Payer: Self-pay

## 2019-09-13 NOTE — Telephone Encounter (Signed)
Patricia [name] tested positive for Covid on 7/17/ 21. I have called to check on her and spoke with sister in law.She reports that Patricia [name] is doing well and taking Keppra as scheduled. I have re-emphasized that she is qualified to get the monoclonal therapy. Sister in law will talk to patient`s mother to make that decision. She will get back to me as soon as possible.  Nicole Cella Nevaya Nagele RN BSN PCCN [phone]-office

## 2019-09-14 ENCOUNTER — Telehealth: Payer: Self-pay

## 2019-09-14 NOTE — Telephone Encounter (Signed)
Nexplanon exchange rescheduled for August 19th @ 10:30 am.patient brother and sister in law made aware. Nicole Cella Lova Urbieta RN BSN PCCN [phone]-cell

## 2019-09-15 ENCOUNTER — Ambulatory Visit: Payer: Medicaid Other

## 2019-09-18 ENCOUNTER — Encounter (HOSPITAL_COMMUNITY): Payer: Self-pay

## 2019-09-18 ENCOUNTER — Emergency Department (HOSPITAL_COMMUNITY)
Admission: EM | Admit: 2019-09-18 | Discharge: 2019-09-19 | Disposition: A | Discharge status: Home / Self Care | Payer: Medicaid Other | Attending: Emergency Medicine | Admitting: Emergency Medicine

## 2019-09-18 DIAGNOSIS — I10 Essential (primary) hypertension: Secondary | ICD-10-CM | POA: Diagnosis not present

## 2019-09-18 DIAGNOSIS — R4689 Other symptoms and signs involving appearance and behavior: Secondary | ICD-10-CM | POA: Insufficient documentation

## 2019-09-18 DIAGNOSIS — F332 Major depressive disorder, recurrent severe without psychotic features: Secondary | ICD-10-CM | POA: Insufficient documentation

## 2019-09-18 DIAGNOSIS — R45851 Suicidal ideations: Secondary | ICD-10-CM | POA: Diagnosis not present

## 2019-09-18 DIAGNOSIS — Z20822 Contact with and (suspected) exposure to covid-19: Secondary | ICD-10-CM | POA: Insufficient documentation

## 2019-09-18 HISTORY — DX: Bipolar disorder, unspecified: F31.9

## 2019-09-18 LAB — CBC
HCT: 37.4 % (ref 36.0–46.0)
Hemoglobin: 11.7 g/dL — ABNORMAL LOW (ref 12.0–15.0)
MCH: 25.2 pg — ABNORMAL LOW (ref 26.0–34.0)
MCHC: 31.3 g/dL (ref 30.0–36.0)
MCV: 80.6 fL (ref 80.0–100.0)
Platelets: 203 10*3/uL (ref 150–400)
RBC: 4.64 MIL/uL (ref 3.87–5.11)
RDW: 13.7 % (ref 11.5–15.5)
WBC: 3.3 10*3/uL — ABNORMAL LOW (ref 4.0–10.5)
nRBC: 0 % (ref 0.0–0.2)

## 2019-09-18 LAB — COMPREHENSIVE METABOLIC PANEL
ALT: 39 U/L (ref 0–44)
AST: 23 U/L (ref 15–41)
Albumin: 3.5 g/dL (ref 3.5–5.0)
Alkaline Phosphatase: 58 U/L (ref 38–126)
Anion gap: 8 (ref 5–15)
BUN: 6 mg/dL (ref 6–20)
CO2: 19 mmol/L — ABNORMAL LOW (ref 22–32)
Calcium: 8.8 mg/dL — ABNORMAL LOW (ref 8.9–10.3)
Chloride: 112 mmol/L — ABNORMAL HIGH (ref 98–111)
Creatinine, Ser: 0.84 mg/dL (ref 0.44–1.00)
GFR calc Af Amer: >60 mL/min (ref >60)
GFR calc non Af Amer: >60 mL/min (ref >60)
Glucose, Bld: 89 mg/dL (ref 70–99)
Potassium: 3.9 mmol/L (ref 3.5–5.1)
Sodium: 139 mmol/L (ref 135–145)
Total Bilirubin: 0.6 mg/dL (ref 0.3–1.2)
Total Protein: 7 g/dL (ref 6.5–8.1)

## 2019-09-18 LAB — SARS CORONAVIRUS 2 BY RT PCR (HOSPITAL ORDER, PERFORMED IN ~~LOC~~ HOSPITAL LAB): SARS Coronavirus 2: NEGATIVE

## 2019-09-18 LAB — SALICYLATE LEVEL: Salicylate Lvl: <7 mg/dL — ABNORMAL LOW (ref 7.0–30.0)

## 2019-09-18 LAB — ACETAMINOPHEN LEVEL: Acetaminophen (Tylenol), Serum: <10 ug/mL — ABNORMAL LOW (ref 10–30)

## 2019-09-18 LAB — I-STAT BETA HCG BLOOD, ED (MC, WL, AP ONLY): I-stat hCG, quantitative: <5 m[IU]/mL (ref <5)

## 2019-09-18 LAB — ETHANOL: Alcohol, Ethyl (B): <10 mg/dL (ref <10)

## 2019-09-18 MED ORDER — LORAZEPAM 2 MG/ML IJ SOLN: 0.0000 mg | Freq: Four times a day (QID) | INTRAMUSCULAR | Status: DC

## 2019-09-18 MED ORDER — LORAZEPAM 1 MG PO TABS: 0.0000 mg | ORAL_TABLET | Freq: Four times a day (QID) | ORAL | Status: DC

## 2019-09-18 MED ORDER — LORAZEPAM 1 MG PO TABS: 0.0000 mg | ORAL_TABLET | Freq: Two times a day (BID) | ORAL | Status: DC

## 2019-09-18 MED ORDER — THIAMINE HCL 100 MG PO TABS
100.0000 mg | ORAL_TABLET | Freq: Every day | ORAL | Status: DC
  Administered 2019-09-18: 100 mg via ORAL
  Filled 2019-09-18: qty 1

## 2019-09-18 MED ORDER — LORAZEPAM 2 MG/ML IJ SOLN: 0.0000 mg | Freq: Two times a day (BID) | INTRAMUSCULAR | Status: DC

## 2019-09-18 MED ORDER — THIAMINE HCL 100 MG/ML IJ SOLN
100.0000 mg | Freq: Every day | INTRAMUSCULAR | Status: DC
  Filled 2019-09-18: qty 2

## 2019-09-18 NOTE — ED Triage Notes (Signed)
Pt bib [employer] as IVC for SI. Per [employer] pt was threatening to run into traffic. Pt has psych hx.  

## 2019-09-18 NOTE — ED Notes (Signed)
Security called to wand the patient.  

## 2019-09-18 NOTE — BH Assessment (Signed)
Behavioral Health Disposition Note:  Per Marciano Sequin, NP, overnight observation for safety and stability
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
## 2019-09-19 LAB — RAPID URINE DRUG SCREEN, HOSP PERFORMED
Amphetamines: NOT DETECTED
Barbiturates: NOT DETECTED
Benzodiazepines: NOT DETECTED
Cocaine: NOT DETECTED
Opiates: NOT DETECTED
Tetrahydrocannabinol: NOT DETECTED

## 2019-09-19 NOTE — ED Notes (Signed)
Belonging given back to patient [employer] taxi called to transport patient home They understand patient speaks Swahili and would "try" to send a native speaker

## 2019-09-19 NOTE — ED Notes (Signed)
Lunch Tray Ordered @ 1027. °

## 2019-09-19 NOTE — ED Notes (Signed)
Interpreter used: Venant ID# [id number]  PT d/c Patient asks to call sister to pick her up Sister says they do not have transportation She is informed that PT will be sent by Taxi to the home Sister confirmed address on file  IVC rescind Confirmation in red folder and med record

## 2019-09-19 NOTE — ED Provider Notes (Signed)
Patient recommended for outpatient management by [facility] team, per today's evaluation note:  Disposition: Patient denies current SI, HI and psychosis. She does not appear internally preoccupied. Patient does have cognitive limitations. Although she presented with concerns as noted above, this is her baseline. There is no evidence of imminent risk to self or others at present. Patient does not meet criteria for psychiatric inpatient admission and is psychiatrically cleared.  Reccommended to continue follow-up with current outpatient psychiatric services.   Stable for discharge   Terald Sleeper, MD 09/19/19 1145

## 2019-09-19 NOTE — ED Notes (Signed)
[employer] here to transport patient home PT is walked to the taxi
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
## 2019-09-19 NOTE — ED Notes (Signed)
PT refused her morning meds

## 2019-09-21 ENCOUNTER — Ambulatory Visit: Payer: Self-pay | Admitting: Licensed Clinical Social Worker

## 2019-09-21 NOTE — Chronic Care Management (AMB) (Signed)
   Social Work  Care Management Consultation  09/21/2019 Name: Patricia [name] MRN: [id number] DOB: 1991-01-18 Patricia [name] is a 29 y.o. year old female who sees Westley Chandler, MD for primary care. LCSW was consulted by PCP for information /resources to assistance patient with  Care Coordination and concerns.     Recommendation: After consultation with provider it is determined that patient may benefit from collaboration with CCM team and congregational nurse team.  Intervention: Patient was not interviewed or contacted during this encounter.  LCSW collaborated with PCP, CCM RN and Camera operator .  Conducted brief assessment and provided recommendations. Relevant information and resources discussed with provider. .    Plan: [name] with Congregational Nurse will conduct a home visit to assess need, and family dynamics and bring recommendations back to PCP and CCM team to best meet the needs of patient and support system. 1. No further follow up required by LCSW at this time until after home visit from Dorothey 2. If further intervention is needed the care management team is available to follow up after a formal CCM referral is placed  3. PCP should consult with patient prior to making referral   Review of patient status, including review of consultants reports, relevant laboratory and other test results, and collaboration with appropriate care team members and the patient's provider was performed as part of comprehensive patient evaluation and provision of chronic care management services.      Sammuel Hines, LCSW Care Management & Coordination  [facility] / [facility]   [phone] 11:38 AM

## 2019-09-27 ENCOUNTER — Encounter: Payer: Self-pay | Admitting: Family Medicine

## 2019-09-28 ENCOUNTER — Ambulatory Visit: Payer: Self-pay | Admitting: Licensed Clinical Social Worker

## 2019-09-28 DIAGNOSIS — Z659 Problem related to unspecified psychosocial circumstances: Secondary | ICD-10-CM | POA: Insufficient documentation

## 2019-09-28 DIAGNOSIS — Z7189 Other specified counseling: Secondary | ICD-10-CM

## 2019-09-28 NOTE — Chronic Care Management (AMB) (Signed)
   Social Work  Care Management Consultation  09/28/2019 Name: Patricia [name] MRN: [id number] DOB: 24-Apr-1990 Patricia [name] is a 29 y.o. year old female who sees Westley Chandler, MD for primary care. LCSW was consulted by PCP for information /resources to assistance patient with Care Coordination.     Intervention: Patient was not interviewed or contacted during this encounter.  LCSW collaborated with PCP, CCM RN, Congregational Nurses [place] and [name] .  Conducted brief assessment of needs and provided recommendations.   Plan:  1. No further follow up required by LCSW at this time 2. If further intervention is needed Congregational Nurses or PCP will contact LCSW 3. Please consult with patient prior to making referral   Review of patient status, including review of consultants reports, relevant laboratory and other test results, and collaboration with appropriate care team members and the patient's provider was performed as part of comprehensive patient evaluation and provision of chronic care management services.      Sammuel Hines, LCSW Care Management & Coordination  [facility] / [facility]   [phone] 5:07 PM

## 2019-10-06 ENCOUNTER — Telehealth: Payer: Self-pay | Admitting: Family Medicine

## 2019-10-06 MED ORDER — LEVETIRACETAM 500 MG PO TABS: 500.0000 mg | ORAL_TABLET | Freq: Two times a day (BID) | ORAL | 3 refills | Status: DC

## 2019-10-06 NOTE — Telephone Encounter (Signed)
Received message from Dr [name]: We are working to help Patricia [name] (and her family) attend appointments and take her medications on a more regular basis. Can we please get Patricia [name] scheduled for an MD or NP visit in the next few months? Please let Patricia [name] know when this is scheduled and we will work with the patient and family at attend.  Patient is scheduled for 10/10/19, 11:30 am , arrive 11 am.

## 2019-10-06 NOTE — Telephone Encounter (Signed)
New prescription for seizure medication to [facility] for packing and delivering to home.  Will work to coordinate Neurology appointment.   [name], MD  Family Medicine Teaching Service

## 2019-10-09 ENCOUNTER — Telehealth: Payer: Self-pay

## 2019-10-09 NOTE — Telephone Encounter (Signed)
I contacted patient`s brother Magdalyn Arenivas and Sister in Writer to remind them of tomorrow`s appointment with neurology. Brother said  that he will be able to drive the patient to this appointment. Congregational nurse will be on standby for transportation needs if needed. Daylene Vandenbosch RN BSN PCCn  [phone]-cell [phone]-office.

## 2019-10-10 ENCOUNTER — Other Ambulatory Visit: Payer: Self-pay

## 2019-10-10 ENCOUNTER — Encounter: Payer: Self-pay | Admitting: Family Medicine

## 2019-10-10 ENCOUNTER — Ambulatory Visit (INDEPENDENT_AMBULATORY_CARE_PROVIDER_SITE_OTHER): Payer: Medicaid Other | Admitting: Family Medicine

## 2019-10-10 VITALS — BP 126/82 | HR 88 | Ht 62.0 in | Wt 185.6 lb

## 2019-10-10 DIAGNOSIS — Z789 Other specified health status: Secondary | ICD-10-CM | POA: Diagnosis not present

## 2019-10-10 DIAGNOSIS — G40909 Epilepsy, unspecified, not intractable, without status epilepticus: Secondary | ICD-10-CM | POA: Diagnosis not present

## 2019-10-10 DIAGNOSIS — F819 Developmental disorder of scholastic skills, unspecified: Secondary | ICD-10-CM | POA: Diagnosis not present

## 2019-10-10 NOTE — Progress Notes (Signed)
I have read the note, and I agree with the clinical assessment and plan.  Clytee Heinrich K Simya Tercero   

## 2019-10-10 NOTE — Patient Instructions (Signed)
Please continue levetiracetam 500mg  twice daily.   Continue healthy diet and regular exercise. Stay well hydrated.   Follow up in 1 year  Levetiracetam tablets What is this medicine? LEVETIRACETAM (lee ve tye RA se tam) is an antiepileptic drug. It is used with other medicines to treat certain types of seizures. This medicine may be used for other purposes; ask your health care provider or pharmacist if you have questions. COMMON BRAND NAME(S): Keppra, Roweepra What should I tell my health care provider before I take this medicine? They need to know if you have any of these conditions:  kidney disease  suicidal thoughts, plans, or attempt; a previous suicide attempt by you or a family member  an unusual or allergic reaction to levetiracetam, other medicines, foods, dyes, or preservatives  pregnant or trying to get pregnant  breast-feeding How should I use this medicine? Take this medicine by mouth with a glass of water. Follow the directions on the prescription label. Swallow the tablets whole. Do not crush or chew this medicine. You may take this medicine with or without food. Take your doses at regular intervals. Do not take your medicine more often than directed. Do not stop taking this medicine or any of your seizure medicines unless instructed by your doctor or health care professional. Stopping your medicine suddenly can increase your seizures or their severity. A special MedGuide will be given to you by the pharmacist with each prescription and refill. Be sure to read this information carefully each time. Contact your pediatrician or health care professional regarding the use of this medication in children. While this drug may be prescribed for children as young as 55 years of age for selected conditions, precautions do apply. Overdosage: If you think you have taken too much of this medicine contact a poison control center or emergency room at once. NOTE: This medicine is only for  you. Do not share this medicine with others. What if I miss a dose? If you miss a dose, take it as soon as you can. If it is almost time for your next dose, take only that dose. Do not take double or extra doses. What may interact with this medicine? This medicine may interact with the following medications:  carbamazepine  colesevelam  probenecid  sevelamer This list may not describe all possible interactions. Give your health care provider a list of all the medicines, herbs, non-prescription drugs, or dietary supplements you use. Also tell them if you smoke, drink alcohol, or use illegal drugs. Some items may interact with your medicine. What should I watch for while using this medicine? Visit your doctor or health care provider for a regular check on your progress. Wear a medical identification bracelet or chain to say you have epilepsy, and carry a card that lists all your medications. This medicine may cause serious skin reactions. They can happen weeks to months after starting the medicine. Contact your health care provider right away if you notice fevers or flu-like symptoms with a rash. The rash may be red or purple and then turn into blisters or peeling of the skin. Or, you might notice a red rash with swelling of the face, lips or lymph nodes in your neck or under your arms. It is important to take this medicine exactly as instructed by your health care provider. When first starting treatment, your dose may need to be adjusted. It may take weeks or months before your dose is stable. You should contact your doctor or health  care provider if your seizures get worse or if you have any new types of seizures. You may get drowsy or dizzy. Do not drive, use machinery, or do anything that needs mental alertness until you know how this medicine affects you. Do not stand or sit up quickly, especially if you are an older patient. This reduces the risk of dizzy or fainting spells. Alcohol may  interfere with the effect of this medicine. Avoid alcoholic drinks. The use of this medicine may increase the chance of suicidal thoughts or actions. Pay special attention to how you are responding while on this medicine. Any worsening of mood, or thoughts of suicide or dying should be reported to your health care provider right away. Women who become pregnant while using this medicine may enroll in the [place] Antiepileptic Drug Pregnancy Registry by calling [phone]. This registry collects information about the safety of antiepileptic drug use during pregnancy. What side effects may I notice from receiving this medicine? Side effects that you should report to your doctor or health care professional as soon as possible:  allergic reactions like skin rash, itching or hives, swelling of the face, lips, or tongue  breathing problems  dark urine  general ill feeling or flu-like symptoms  problems with balance, talking, walking  rash, fever, and swollen lymph nodes  redness, blistering, peeling or loosening of the skin, including inside the mouth  unusually weak or tired  worsening of mood, thoughts or actions of suicide or dying  yellowing of the eyes or skin Side effects that usually do not require medical attention (report to your doctor or health care professional if they continue or are bothersome):  diarrhea  dizzy, drowsy  headache  loss of appetite This list may not describe all possible side effects. Call your doctor for medical advice about side effects. You may report side effects to FDA at 1-800-FDA-1088. Where should I keep my medicine? Keep out of reach of children. Store at room temperature between 15 and 30 degrees C (59 and 86 degrees F). Throw away any unused medicine after the expiration date. NOTE: This sheet is a summary. It may not cover all possible information. If you have questions about this medicine, talk to your doctor, pharmacist, or health  care provider.  2020 Elsevier/Gold Standard (2018-05-14 15:23:36)   Seizure, Adult A seizure is a sudden burst of abnormal electrical activity in the brain. Seizures usually last from 30 seconds to 2 minutes. They can cause many different symptoms. Usually, seizures are not harmful unless they last a long time. What are the causes? Common causes of this condition include:  Fever or infection.  Conditions that affect the brain, such as: ? A brain abnormality that you were born with. ? A brain or head injury. ? Bleeding in the brain. ? A tumor. ? Stroke. ? Brain disorders such as autism or cerebral palsy.  Low blood sugar.  Conditions that are passed from parent to child (are inherited).  Problems with substances, such as: ? Having a reaction to a drug or a medicine. ? Suddenly stopping the use of a substance (withdrawal). In some cases, the cause may not be known. A person who has repeated seizures over time without a clear cause has a condition called epilepsy. What increases the risk? You are more likely to get this condition if you have:  A family history of epilepsy.  Had a seizure in the past.  A brain disorder.  A history of head injury, lack  of oxygen at birth, or strokes. What are the signs or symptoms? There are many types of seizures. The symptoms vary depending on the type of seizure you have. Examples of symptoms during a seizure include:  Shaking (convulsions).  Stiffness in the body.  Passing out (losing consciousness).  Head nodding.  Staring.  Not responding to sound or touch.  Loss of bladder control and bowel control. Some people have symptoms right before and right after a seizure happens. Symptoms before a seizure may include:  Fear.  Worry (anxiety).  Feeling like you may vomit (nauseous).  Feeling like the room is spinning (vertigo).  Feeling like you saw or heard something before (dj vu).  Odd tastes or smells.  Changes in  how you see. You may see flashing lights or spots. Symptoms after a seizure happens can include:  Confusion.  Sleepiness.  Headache.  Weakness on one side of the body. How is this treated? Most seizures will stop on their own in under 5 minutes. In these cases, no treatment is needed. Seizures that last longer than 5 minutes will usually need treatment. Treatment can include:  Medicines given through an IV tube.  Avoiding things that are known to cause your seizures. These can include medicines that you take for another condition.  Medicines to treat epilepsy.  Surgery to stop the seizures. This may be needed if medicines do not help. Follow these instructions at home: Medicines  Take over-the-counter and prescription medicines only as told by your doctor.  Do not eat or drink anything that may keep your medicine from working, such as alcohol. Activity  Do not do any activities that would be dangerous if you had another seizure, like driving or swimming. Wait until your doctor says it is safe for you to do them.  If you live in the [place] ask your local DMV (department of motor vehicles) when you can drive.  Get plenty of rest. Teaching others Teach friends and family what to do when you have a seizure. They should:  Lay you on the ground.  Protect your head and body.  Loosen any tight clothing around your neck.  Turn you on your side.  Not hold you down.  Not put anything into your mouth.  Know whether or not you need emergency care.  Stay with you until you are better.  General instructions  Contact your doctor each time you have a seizure.  Avoid anything that gives you seizures.  Keep a seizure diary. Write down: ? What you think caused each seizure. ? What you remember about each seizure.  Keep all follow-up visits as told by your doctor. This is important. Contact a doctor if:  You have another seizure.  You have seizures more often.  There is  any change in what happens during your seizures.  You keep having seizures with treatment.  You have symptoms of being sick or having an infection. Get help right away if:  You have a seizure that: ? Lasts longer than 5 minutes. ? Is different than seizures you had before. ? Makes it harder to breathe. ? Happens after you hurt your head.  You have any of these symptoms after a seizure: ? Not being able to speak. ? Not being able to use a part of your body. ? Confusion. ? A bad headache.  You have two or more seizures in a row.  You do not wake up right after a seizure.  You get hurt during a  seizure. These symptoms may be an emergency. Do not wait to see if the symptoms will go away. Get medical help right away. Call your local emergency services (911 in the [place] Do not drive yourself to the hospital. Summary  Seizures usually last from 30 seconds to 2 minutes. Usually, they are not harmful unless they last a long time.  Do not eat or drink anything that may keep your medicine from working, such as alcohol.  Teach friends and family what to do when you have a seizure.  Contact your doctor each time you have a seizure. This information is not intended to replace advice given to you by your health care provider. Make sure you discuss any questions you have with your health care provider. Document Revised: 04/30/2018 Document Reviewed: 04/30/2018 Elsevier Patient Education  2020 [employer].

## 2019-10-10 NOTE — Progress Notes (Signed)
PATIENT: Patricia [name] DOB: 03-May-1990  REASON FOR VISIT: follow up HISTORY FROM: patient  Chief Complaint  Patient presents with  . Follow-up    f/u on seizures, per family she has been doing well since last visit.   Marland Kitchen room 2    with family and interpreter     HISTORY OF PRESENT ILLNESS: Today 10/10/19 Patricia [name] is a 29 y.o. female here today for follow up. She presents today with her brother and sister in law who aid in history. She continues levetiracetam 500mg  twice daily. Family reports that she is doing well on this medication. They denies recent seizure activity. She is followed by Dr , PCP. Family reports that they have not yet received refill sent by Dr [name] on 8/12. Her brother will go by pharmacy today to inquire. Patricia [name] reports feeling well. No concerns. No pain. She is cognitively impaired but able to communicate with me today via interpretor.    Patricia [name], interpreter, assists with visit. Patient speaks Swahili.    HISTORY: (copied from 10/12 note on 12/02/2017)  UPDATE 10/9/2019CM Ms.Maiers, 29 year old female returns for follow-up with a history of seizure disorder since the age of 89.  She speaks Swahili and has an 18.  She also has a history of being cognitively slow.  Her seizure events are associated with dystonic posturing of the fingers in the hands and then fall with a generalized seizure event.  The seizure usually resolves in 2 to 3 minutes.  CT of the head performed 08/16/2017 was normal without acute intracranial process.  EEG performed 09/08/2017 This is an abnormal EEG recording secondary to right hemispheric slowing. This study suggests a right brain abnormality with a lowered seizure threshold. No electrographic seizures were recorded however.  The patient's brother states today that she has had approximately 10 seizures since her last visit here with Dr. 09/10/2017.  Her last one occurred last Thursday.  They have not  called into the office to let Thursday know that she was continuing with seizure events.  She returns for reevaluation    08/13/17 KWMs.Kinyonyiis a 29 year old left-handed African female with a history of seizures since age 64. The patient comes in with her brother and an interpreter today. The seizures were apparently of spontaneous onset, there is no history of head trauma. There is no family history of seizures. The patient has been noted to be somewhat cognitively slow throughout her life. The patient is having about one seizure event a month. The episodes are associated with dystonic posturing of the fingers of the hands, the patient would then begin to spin around and then she may fall down with a generalized seizure event. The patient may resolve the seizure within 2 or 3 minutes, it may take her another minute or 2 to start speaking more normally without confusion. The patient reports dizziness prior to the onset of the seizure. The patient has no focal numbness or weakness of the face, arms, legs. She has been on gabapentin taking 400 mg 3 times daily but this has not been effective and fully controlling the seizures. She apparently has never had a scanning procedure such as CT or MRI of the brain, she has never had an EEG study. She has 4 brothers and 6 sisters, one brother has hepatitis B, otherwise the siblings are in good health. The patient comes into the office today for an evaluation.   REVIEW OF SYSTEMS: Out of a complete 14 system review of  symptoms, the patient complains only of the following symptoms, none and all other reviewed systems are negative.  ALLERGIES: No Known Allergies  HOME MEDICATIONS: Outpatient Medications Prior to Visit  Medication Sig Dispense Refill  . levETIRAcetam (KEPPRA) 500 MG tablet Take 1 tablet (500 mg total) by mouth 2 (two) times daily. 180 tablet 3   No facility-administered medications prior to visit.    PAST MEDICAL HISTORY: Past  Medical History:  Diagnosis Date  . Bipolar 1 disorder (HCC)   . Cerebral palsy (HCC)   . Hypertension   . Seizures (HCC)   . Suicidal behavior     PAST SURGICAL HISTORY: No past surgical history on file.  FAMILY HISTORY: Family History  Problem Relation Age of Onset  . Hepatitis Brother     SOCIAL HISTORY: Social History   Socioeconomic History  . Marital status: Single    Spouse name: Not on file  . Number of children: 1  . Years of education: Not on file  . Highest education level: Not on file  Occupational History    Comment: none  Tobacco Use  . Smoking status: Never Smoker  . Smokeless tobacco: Never Used  Vaping Use  . Vaping Use: Unknown  Substance and Sexual Activity  . Alcohol use: Yes    Alcohol/week: 2.0 standard drinks    Types: 2 Cans of beer per week    Comment: occasional alcohol use  . Drug use: Never  . Sexual activity: Not Currently  Other Topics Concern  . Not on file  Social History Narrative   ** Merged History Encounter **       ** Merged History Encounter **       ** Merged History Encounter **       Lives with mother, family   Social Determinants of Health   Financial Resource Strain:   . Difficulty of Paying Living Expenses:   Food Insecurity:   . Worried About Programme researcher, broadcasting/film/video in the Last Year:   . Barista in the Last Year:   Transportation Needs:   . Freight forwarder (Medical):   Marland Kitchen Lack of Transportation (Non-Medical):   Physical Activity:   . Days of Exercise per Week:   . Minutes of Exercise per Session:   Stress:   . Feeling of Stress :   Social Connections:   . Frequency of Communication with Friends and Family:   . Frequency of Social Gatherings with Friends and Family:   . Attends Religious Services:   . Active Member of Clubs or Organizations:   . Attends Banker Meetings:   Marland Kitchen Marital Status:   Intimate Partner Violence:   . Fear of Current or Ex-Partner:   . Emotionally  Abused:   Marland Kitchen Physically Abused:   . Sexually Abused:       PHYSICAL EXAM  Vitals:   10/10/19 1124  BP: 126/82  Pulse: 88  Weight: 185 lb 9.6 oz (84.2 kg)  Height: 5\' 2"  (1.575 m)   Body mass index is 33.95 kg/m.  Generalized: Well developed, in no acute distress  Cardiology: normal rate and rhythm, no murmur noted Respiratory: clear to auscultation bilaterally  Neurological examination  Mentation: Alert, not oriented to time, she is aware that she is at a doctor's office but otherwise not oriented to place, she is unable to assist history taking. Follows most commands speech and language fluent.  Cranial nerve II-XII: Pupils were equal round reactive to light. Extraocular  movements were full, visual field were full on confrontational test. Facial sensation and strength were normal. Head turning and shoulder shrug  were normal and symmetric. Motor: The motor testing reveals 5 over 5 strength of all 4 extremities. Good symmetric motor tone is noted throughout.  Sensory: Sensory testing is intact to soft touch on all 4 extremities. No evidence of extinction is noted.  Coordination: Cerebellar testing reveals good finger-nose-finger and heel-to-shin bilaterally.  Gait and station: Gait is normal.    DIAGNOSTIC DATA (LABS, IMAGING, TESTING) - I reviewed patient records, labs, notes, testing and imaging myself where available.  No flowsheet data found.   Lab Results  Component Value Date   WBC 3.3 (L) 09/18/2019   HGB 11.7 (L) 09/18/2019   HCT 37.4 09/18/2019   MCV 80.6 09/18/2019   PLT 203 09/18/2019      Component Value Date/Time   NA 139 09/18/2019 1305   NA 140 08/04/2017 1111   K 3.9 09/18/2019 1305   CL 112 (H) 09/18/2019 1305   CO2 19 (L) 09/18/2019 1305   GLUCOSE 89 09/18/2019 1305   BUN 6 09/18/2019 1305   BUN 8 08/04/2017 1111   CREATININE 0.84 09/18/2019 1305   CALCIUM 8.8 (L) 09/18/2019 1305   PROT 7.0 09/18/2019 1305   PROT 7.4 08/04/2017 1111   ALBUMIN  3.5 09/18/2019 1305   ALBUMIN 4.1 08/04/2017 1111   AST 23 09/18/2019 1305   ALT 39 09/18/2019 1305   ALKPHOS 58 09/18/2019 1305   BILITOT 0.6 09/18/2019 1305   BILITOT <0.2 08/04/2017 1111   GFRNONAA >60 09/18/2019 1305   GFRAA >60 09/18/2019 1305   Lab Results  Component Value Date   CHOL 129 06/30/2019   HDL 38 (L) 06/30/2019   LDLCALC 80 06/30/2019   TRIG 55 06/30/2019   CHOLHDL 3.4 06/30/2019   Lab Results  Component Value Date   HGBA1C 4.9 07/11/2019   No results found for: [id number] Lab Results  Component Value Date   TSH 1.312 06/30/2019       ASSESSMENT AND PLAN 29 y.o. year old female  has a past medical history of Bipolar 1 disorder (HCC), Cerebral palsy (HCC), Hypertension, Seizures (HCC), and Suicidal behavior. here with     ICD-10-CM   1. Seizure disorder (HCC)  G40.909   2. Language barrier  Z78.9   3. Cognitive developmental delay  F81.9     Carnelia is doing well. She continues to tolerate levetiracetam 500mg  twice daily. Family assists her with ADLs including medication administration. Neuro exam intact today, however, slightly limited due to cognitive impairments and language barrier. I have reviewed appropriate administration and importance of medication compliance with her family. We have discussed seizure precautions. I have encouraged healthy lifestyle habits. She will continue to follow up with PCP as advised. She will return to see annually. Her family verbalizes understanding and agreement with this plan. PCP has filled levetiracetam for 1 year.    No orders of the defined types were placed in this encounter.    No orders of the defined types were placed in this encounter.     I spent 30 minutes with the patient. 50% of this time was spent counseling and educating patient on plan of care and medications.    [name], FNP-C 10/10/2019, 12:52 PM [facility] [address], [address] [place], [place] [phone]

## 2019-10-10 NOTE — Telephone Encounter (Signed)
Good afternoon, Dr brown. I had the pleasure of seeing Patricia [name] in the office this morning. She is doing very well on levetiracetam. Thank you for refilling this medication for her. We have educated her brother and sister on medication dosing and seizure precautions. I did not see a congregation nurse with her and I apologize for not getting this message sooner. If there is anything at all we can help with please let me know!  Have a great day!  Shahad Mazurek

## 2019-10-13 ENCOUNTER — Ambulatory Visit: Payer: Medicaid Other

## 2019-10-18 ENCOUNTER — Telehealth: Payer: Self-pay

## 2019-10-18 NOTE — Telephone Encounter (Signed)
I have contacted [facility] and confirmed appointment August 26th at 10:20am. I have called patient`s brother Faustino Congress and informed him of date and time of this appointment.Also texted him the address.  Nicole Cella Maribell Demeo RnBSN PCCn [phone]-cell [phone]-office

## 2019-10-20 ENCOUNTER — Telehealth: Payer: Self-pay | Admitting: Family Medicine

## 2019-10-20 ENCOUNTER — Telehealth: Payer: Self-pay

## 2019-10-20 ENCOUNTER — Ambulatory Visit (INDEPENDENT_AMBULATORY_CARE_PROVIDER_SITE_OTHER): Payer: Medicaid Other | Admitting: Family Medicine

## 2019-10-20 ENCOUNTER — Other Ambulatory Visit: Payer: Self-pay

## 2019-10-20 VITALS — BP 124/68 | HR 84 | Wt 184.8 lb

## 2019-10-20 DIAGNOSIS — Z309 Encounter for contraceptive management, unspecified: Secondary | ICD-10-CM | POA: Diagnosis not present

## 2019-10-20 DIAGNOSIS — R45851 Suicidal ideations: Secondary | ICD-10-CM

## 2019-10-20 DIAGNOSIS — Z3046 Encounter for surveillance of implantable subdermal contraceptive: Secondary | ICD-10-CM

## 2019-10-20 LAB — POCT URINE PREGNANCY: Preg Test, Ur: NEGATIVE

## 2019-10-20 MED ORDER — ETONOGESTREL 68 MG ~~LOC~~ IMPL
68.0000 mg | DRUG_IMPLANT | Freq: Once | SUBCUTANEOUS | Status: AC
  Administered 2019-10-20: 68 mg via SUBCUTANEOUS

## 2019-10-20 MED ORDER — LEVETIRACETAM 500 MG PO TABS: 500.0000 mg | ORAL_TABLET | Freq: Two times a day (BID) | ORAL | 3 refills | Status: DC

## 2019-10-20 MED ORDER — LEVETIRACETAM 500 MG PO TABS: 500.0000 mg | ORAL_TABLET | Freq: Two times a day (BID) | ORAL | 1 refills | Status: DC

## 2019-10-20 NOTE — Telephone Encounter (Signed)
Patient seen after Nexplanon removal and replacement. She reports her mood is the same. She is happy to live her mother. Reports she feels safe at home. 90 day supply of medications sent to pharmacy to pick up today. She reports she has been without medications.  Will send to pill pack again today. Sent to [facility].  [name], MD  Family Medicine Teaching Service

## 2019-10-20 NOTE — Telephone Encounter (Signed)
I have called [employer] and ride scheduled for 0920hrs to go to  [facility] medicine today. Raeonna Milo RN BSn PCCn  [phone]-office [phone]-cell

## 2019-10-20 NOTE — Patient Instructions (Signed)
Nexplanon Instructions After Insertion  Keep bandage clean and dry for 24 hours  May use ice/Tylenol/Ibuprofen for soreness or pain  If you develop fever, drainage or increased warmth from incision site-contact office immediately   

## 2019-10-20 NOTE — Telephone Encounter (Signed)
Patient Brother called me from Dr Theora Gianotti office requesting transportation back home.I have called [employer] and scheduled a ride for 1140 am  Arman Bogus RN BSN PCCN  [phone] [phone]-Office

## 2019-10-20 NOTE — Progress Notes (Signed)
Progestin Implant Removal Note Urine pregnancy test negative  Patricia [name] is here for removal of her etonogestrel rod implant. She would like it removed because of: obsolete, >3 years since placement  An informed consent was taken prior to removal and is to be scanned into the Electronic Health Record.  Risks of the procedure include: bleeding, infection, difficulty with removal, scarring and nerve damage. There may be bruising at the site of incision and down the arm.  Procedure Note: Time out taken: yes, 10:45 AM  Team: Dr. Lum Babe, Dr. Leary Roca, Hyman Bible, MS3  [name], DOB: 09-Apr-1990 confirmed (yes)  Procedure: Progestin Implant Removal  Procedure confirmed by patient and team (yes)  Side: (RIGHT)  Position correct for procedure (YES)  Equipment for procedure available (YES)  The patient is place in the supine position. Aseptic conditions are maintained. The rod is located by palpation. The area is cleaned with antiseptic. 1.5 cc of 1% lidocaine with epinephrine is injected just underneath the end of the implant closest to the elbow. After firmly pressing down on the end of the implant closer to the axilla a 2-3 mm incision is made with a scalpel. The rod is pushed to the incision site and grasped with a mosquito forceps and gently removed. Blunt dissection was needed. The patient tolerated the procedure well. The rod was removed in its entirety.   Nexplanon Insertion Note PRE-OP DIAGNOSIS: desired long-term, reversible contraception  POST-OP DIAGNOSIS: Same  PROCEDURE: Nexplanon  placement Performing Physician: Shirlean Mylar, MD Supervising Physician: Janit Pagan, MD   PROCEDURE:  Site (check):       [x_]      Right Arm        [_]     Left Arm        Serial # [id number] Sterile Preparation:    [_x]      Betadine        [_]     Chloraprep        Expiration Date on device [01/12/2022]   Insertion site was selected 8 - 10 cm from medial epicondyle and  marked along with guiding site using sterile marker Procedure area was prepped and draped in a sterile fashion. 1.5 mL of 1% lidocaine  with epinephrine used for subcutaneous anesthesia. Anesthesia confirmed.  Nexplanon  trocar was inserted subcutaneously and then Nexplanon  capsule delivered subcutaneously Trocar was removed from the insertion site. Nexplanon  capsule was palpated by provider and patient to assure satisfactory placement. Estimated blood loss of 1  mL Dressings applied:     _   Adhesive Dressing     x  Gauze/Tape     _   Biocclusive Followup: The patient tolerated the procedure well without complications.  Standard post-procedure care is explained and return precautions are given. The incision was dressed with a small adhesive bandage closure and a pressure dressing was applied.  Shirlean Mylar, MD [facility], PGY-2

## 2019-11-14 ENCOUNTER — Encounter: Payer: Self-pay | Admitting: Family Medicine

## 2019-11-15 ENCOUNTER — Ambulatory Visit: Payer: Self-pay

## 2019-11-15 NOTE — Chronic Care Management (AMB) (Signed)
   RNCM Care Management Collaboration  Late entry 11/14/19 11/15/2019 Name: Patricia [name] MRN: [id number] DOB: 09/21/1990   Patricia [name] is a 29 y.o. year old female who sees Westley Chandler, MD for primary care. RNCM was consulted by PCP to assistance patient with  Care Coordination for pill packing.        Intervention: Called patient's pharmacy and spoke with [place].  Pill packing was started on 8/26 with medication Keppra 500 mg 1 bid.  Information was sent to Dr Manson Passey via in basket.  Patient was not interviewed or contacted during this encounter.   Review of patient status, including review of consultants reports, relevant laboratory and other test results, and collaboration with appropriate care team members and the patient's provider was performed as part of comprehensive patient evaluation and provision of chronic care management services.       Plan:  1. No further follow up required by RN care manager at this time   Juanell Fairly RN, BSN, [facility] Care Management Coordinator [facility] Phone: [phone] Fax: [phone]

## 2019-12-07 NOTE — Congregational Nurse Program (Signed)
  Dept: [phone]   Congregational Nurse Program Note  Date of Encounter: 12/07/2019  Past Medical History: Past Medical History:  Diagnosis Date  . Bipolar 1 disorder (HCC)   . Cerebral palsy (HCC)   . Hypertension   . Seizures (HCC)   . Suicidal behavior     Encounter Details: 12/05/20@ 1245pm I visited Ms Zaugg at home. She is in good spirits and outside washing clothes by hand. She says that she enjoys doing it. Sister in law Fredric Dine reports that patient has been taking Keppra as advised. No other concerns at this time. Kailan Carmen rn BSN PCCN  [facility] [phone]-office [phone]-cell

## 2019-12-15 ENCOUNTER — Ambulatory Visit: Payer: Medicaid Other | Admitting: Family Medicine

## 2019-12-15 NOTE — Progress Notes (Deleted)
    SUBJECTIVE:   CHIEF COMPLAINT / HPI: Form completion   A Swahili interpreter was used for the duration of the visit.  Patricia [name] is a 29 year old female presenting discussed the following:  ***  PERTINENT  PMH / PSH: Seizure disorder, hepatitis B, language barrier with Swahili, adjustment disorder and depressed mood without psychotic features  OBJECTIVE:   There were no vitals taken for this visit.  ***  ASSESSMENT/PLAN:   No problem-specific Assessment & Plan notes found for this encounter.     Allayne Stack, DO [facility]

## 2020-01-06 ENCOUNTER — Encounter: Payer: Self-pay | Admitting: Family Medicine

## 2020-01-13 ENCOUNTER — Telehealth: Payer: Self-pay | Admitting: *Deleted

## 2020-01-13 ENCOUNTER — Telehealth: Payer: Self-pay

## 2020-01-13 NOTE — Telephone Encounter (Signed)
Will route to NP for advice.

## 2020-01-13 NOTE — Telephone Encounter (Signed)
-----   Message from Westley Chandler, MD sent at 01/12/2020  4:58 PM EST ----- Regarding: Increased Seizure Frequency Hi Patricia [name],  I am reaching out in hopes of scheduling Patricia [name] a sooner appointment. She has been much more compliant with her medications but seems to have had several seizures at nighttime in the past month--any chance we can get her in to check in? The congregational nurses are helping with her meds which are now mailed to her house. If you let us know of the changes we can make sure they are mailed for increased compliance.   Thanks!  [name], MD  Family Medicine Teaching Service

## 2020-01-13 NOTE — Telephone Encounter (Signed)
I have contacted patient`s brother Faustino Congress and informed him  that an appointment has been scheduled for November 23rd at 10:30am with [facility] neurology.He agrees to this date and time and he will accompany Zema for this appointment. Transportation will be provided by [facility] Nurse office. Arman Bogus RN BSN PCCN [facility] [phone]-cell [phone]-office

## 2020-01-16 ENCOUNTER — Telehealth: Payer: Self-pay

## 2020-01-16 NOTE — Telephone Encounter (Signed)
I have called patient`s mother to remind her of tomorrow`s appointment and texted patient`s brother Faustino Congress in Swahili to remind him of the same. Mom verbalized understanding of the time,date and location of this appointment. Congregational Nurse Office will provide transportation. Nicole Cella Severin Bou RN BSN PCCn  [facility] [phone]-cell [phone]-office

## 2020-01-16 NOTE — Progress Notes (Addendum)
Chief Complaint  Patient presents with  . Follow-up    pt here with her brother. Pt had a seizure last week. Pt's bother said it always happen when hes not around.  . Seizures    pts brother said she is confused sometime so he answers for her     HISTORY OF PRESENT ILLNESS: Today 01/17/20  Patricia [name] is a 29 y.o. female here today for follow up for questionable breakthrough seizures. She presents with her brother and sister in law who aid in history with the assistance of interpreter. She was last seen in 09/2019 when she was reportedly doing well. No seizures at that time. Her brother reports that over the past two weeks she has had 3 seizures. With event, she will have urinary incontinence and then stare off into space. No tonic clonic movements but is not responsive. She has collapsed once. She has had some behavioral concerns as well with leaving the home without a supervisor. She has a caregiver 24 hours a day. She is working with mental health provider per her brother. She eats and drinks well but prefers mostly fruits and drinks soda and juice. Very little water.    HISTORY (copied from previous note)  Patricia [name] is a 29 y.o. female here today for follow up. She presents today with her brother and sister in law who aid in history. She continues levetiracetam 500mg  twice daily. Family reports that she is doing well on this medication. They denies recent seizure activity. She is followed by Dr , PCP. Family reports that they have not yet received refill sent by Dr [name] on 8/12. Her brother will go by pharmacy today to inquire. Adelyn reports feeling well. No concerns. No pain. She is cognitively impaired but able to communicate with me today via interpretor.    Jocelyne, interpreter, assists with visit. Patient speaks Swahili.     REVIEW OF SYSTEMS: Out of a complete 14 system review of symptoms, the patient complains only of the following symptoms,  seizures, and all other reviewed systems are negative.   ALLERGIES: No Known Allergies   HOME MEDICATIONS: Outpatient Medications Prior to Visit  Medication Sig Dispense Refill  . levETIRAcetam (KEPPRA) 500 MG tablet Take 1 tablet (500 mg total) by mouth 2 (two) times daily. 180 tablet 3   No facility-administered medications prior to visit.     PAST MEDICAL HISTORY: Past Medical History:  Diagnosis Date  . Bipolar 1 disorder (HCC)   . Cerebral palsy (HCC)   . Hypertension   . Seizures (HCC)   . Suicidal behavior      PAST SURGICAL HISTORY: No past surgical history on file.   FAMILY HISTORY: Family History  Problem Relation Age of Onset  . Hepatitis Brother      SOCIAL HISTORY: Social History   Socioeconomic History  . Marital status: Single    Spouse name: Not on file  . Number of children: 1  . Years of education: Not on file  . Highest education level: Not on file  Occupational History    Comment: none  Tobacco Use  . Smoking status: Never Smoker  . Smokeless tobacco: Never Used  Vaping Use  . Vaping Use: Unknown  Substance and Sexual Activity  . Alcohol use: Yes    Alcohol/week: 2.0 standard drinks    Types: 2 Cans of beer per week    Comment: occasional alcohol use  . Drug use: Never  . Sexual activity:  Not Currently  Other Topics Concern  . Not on file  Social History Narrative   ** Merged History Encounter **       ** Merged History Encounter **       ** Merged History Encounter **       Lives with mother, family   Social Determinants of Health   Financial Resource Strain:   . Difficulty of Paying Living Expenses: Not on file  Food Insecurity:   . Worried About Programme researcher, broadcasting/film/video in the Last Year: Not on file  . Ran Out of Food in the Last Year: Not on file  Transportation Needs:   . Lack of Transportation (Medical): Not on file  . Lack of Transportation (Non-Medical): Not on file  Physical Activity:   . Days of Exercise per  Week: Not on file  . Minutes of Exercise per Session: Not on file  Stress:   . Feeling of Stress : Not on file  Social Connections:   . Frequency of Communication with Friends and Family: Not on file  . Frequency of Social Gatherings with Friends and Family: Not on file  . Attends Religious Services: Not on file  . Active Member of Clubs or Organizations: Not on file  . Attends Banker Meetings: Not on file  . Marital Status: Not on file  Intimate Partner Violence:   . Fear of Current or Ex-Partner: Not on file  . Emotionally Abused: Not on file  . Physically Abused: Not on file  . Sexually Abused: Not on file      PHYSICAL EXAM  Vitals:   01/17/20 1035 01/17/20 1040  BP: (!) 86/58 100/72  Pulse: 71 73  Weight: 187 lb (84.8 kg)   Height: 5\' 5"  (1.651 m)    Body mass index is 31.12 kg/m.   Generalized: Well developed, in no acute distress  Cardiology: normal rate and rhythm, no murmur auscultated  Respiratory: clear to auscultation bilaterally    Neurological examination  Mentation: Alert, not oriented to time, place, or history taking. Follows most commands speech and language fluent, developmentally delayed, laughs though out visit Cranial nerve II-XII: Pupils were equal round reactive to light. Extraocular movements were full, visual field were full on confrontational test. Facial sensation and strength were normal. Head turning and shoulder shrug  were normal and symmetric. Motor: The motor testing reveals 5 over 5 strength of all 4 extremities. Good symmetric motor tone is noted throughout.  Sensory: Sensory testing is intact to soft touch on all 4 extremities. No evidence of extinction is noted.  Coordination: she is unable to follow instructions Gait and station: Gait is wide Reflexes: Deep tendon reflexes are symmetric and normal bilaterally.     DIAGNOSTIC DATA (LABS, IMAGING, TESTING) - I reviewed patient records, labs, notes, testing and  imaging myself where available.  Lab Results  Component Value Date   WBC 3.3 (L) 09/18/2019   HGB 11.7 (L) 09/18/2019   HCT 37.4 09/18/2019   MCV 80.6 09/18/2019   PLT 203 09/18/2019      Component Value Date/Time   NA 139 09/18/2019 1305   NA 140 08/04/2017 1111   K 3.9 09/18/2019 1305   CL 112 (H) 09/18/2019 1305   CO2 19 (L) 09/18/2019 1305   GLUCOSE 89 09/18/2019 1305   BUN 6 09/18/2019 1305   BUN 8 08/04/2017 1111   CREATININE 0.84 09/18/2019 1305   CALCIUM 8.8 (L) 09/18/2019 1305   PROT 7.0 09/18/2019 1305  PROT 7.4 08/04/2017 1111   ALBUMIN 3.5 09/18/2019 1305   ALBUMIN 4.1 08/04/2017 1111   AST 23 09/18/2019 1305   ALT 39 09/18/2019 1305   ALKPHOS 58 09/18/2019 1305   BILITOT 0.6 09/18/2019 1305   BILITOT <0.2 08/04/2017 1111   GFRNONAA >60 09/18/2019 1305   GFRAA >60 09/18/2019 1305   Lab Results  Component Value Date   CHOL 129 06/30/2019   HDL 38 (L) 06/30/2019   LDLCALC 80 06/30/2019   TRIG 55 06/30/2019   CHOLHDL 3.4 06/30/2019   Lab Results  Component Value Date   HGBA1C 4.9 07/11/2019   No results found for: VITAMINB12 Lab Results  Component Value Date   TSH 1.312 06/30/2019      ASSESSMENT AND PLAN  29 y.o. year old female  has a past medical history of Bipolar 1 disorder (HCC), Cerebral palsy (HCC), Hypertension, Seizures (HCC), and Suicidal behavior. here with   Seizure disorder (HCC) - Plan: CBC with Differential/Platelets, CMP, Levetiracetam level  Cognitive developmental delay   Aralynn has had 2-3 seizure-like events over the past 2 weeks.  No obvious triggers and no changes in medication dosing.  We will check labs today.  I will increase levetiracetam to 1000 mg twice daily.  I have reviewed instructions with her brother via interpreter services and he verbalizes understanding.  I provided additional information in her AVS for congregational nurse to review at home.  Healthy lifestyle habits encouraged.  Adequate hydration reviewed.   She will continue close follow-up with primary care and mental health.  I will have her follow-up in 3 months, sooner if needed.  Her brother verbalizes understanding and agreement with this plan.   I spent 30 minutes of face-to-face and non-face-to-face time with patient.  This included previsit chart review, lab review, study review, order entry, electronic health record documentation, patient education.    Shawnie Dapper, MSN, FNP-C 01/17/2020, 11:11 AM  [facility] [address] [place], [place] [address] [phone]

## 2020-01-17 ENCOUNTER — Encounter: Payer: Self-pay | Admitting: Family Medicine

## 2020-01-17 ENCOUNTER — Ambulatory Visit (INDEPENDENT_AMBULATORY_CARE_PROVIDER_SITE_OTHER): Payer: Medicaid Other | Admitting: Family Medicine

## 2020-01-17 ENCOUNTER — Telehealth: Payer: Self-pay

## 2020-01-17 VITALS — BP 100/72 | HR 73 | Ht 65.0 in | Wt 187.0 lb

## 2020-01-17 DIAGNOSIS — F819 Developmental disorder of scholastic skills, unspecified: Secondary | ICD-10-CM

## 2020-01-17 DIAGNOSIS — G40909 Epilepsy, unspecified, not intractable, without status epilepticus: Secondary | ICD-10-CM | POA: Diagnosis not present

## 2020-01-17 MED ORDER — LEVETIRACETAM 1000 MG PO TABS: 1000.0000 mg | ORAL_TABLET | Freq: Two times a day (BID) | ORAL | 11 refills | Status: DC

## 2020-01-17 NOTE — Congregational Nurse Program (Signed)
  Dept: [phone]   Congregational Nurse Program Note  Date of Encounter: 01/17/2020  Past Medical History: Past Medical History:  Diagnosis Date  . Bipolar 1 disorder (HCC)   . Cerebral palsy (HCC)   . Hypertension   . Seizures (HCC)   . Suicidal behavior     Encounter Details: 1445 hrs-This is a follow-up visit at home after a client went to see her neurologist today. I found Patricia [name] in the Kitchen washing dishes and was excited to see me.I thanked her for agreeing to go the doctor`s office earlier today. I discussed with her 2 brothers and sister in law [name] that dosage has been increased to 1000 mg twice a day.I verified that Patricia Manrique currently has 500 mg and should take 2 tablets in the morning and 2 tablets in the evening. All questions answered and they all have my number to call incase of more questions or concerns. Nicole Cella Skarlet Lyons Rn BSN PCCn  [facility] [phone]-cell

## 2020-01-17 NOTE — Patient Instructions (Addendum)
Below is our plan:  We will increase levetiracetam to 1000mg  twice daily. Please take medication twice daily every day. I will update labs today.   Please make sure you are staying well hydrated. I recommend 50-60 ounces daily. Well balanced diet and regular exercise encouraged.    Please continue follow up with care team as directed.   Follow up in 3 months   You may receive a survey regarding today's visit. I encourage you to leave honest feed back as I do use this information to improve patient care. Thank you for seeing me today!    According to [place] law, you can not drive unless you are seizure / syncope free for at least 6 months and under physician's care.  Please maintain precautions. Do not participate in activities where a loss of awareness could harm you or someone else. No swimming alone, no tub bathing, no hot tubs, no driving, no operating motorized vehicles (cars, ATVs, motocycles, etc), lawnmowers, power tools or firearms. No standing at heights, such as rooftops, ladders or stairs. Avoid hot objects such as stoves, heaters, open fires. Wear a helmet when riding a bicycle, scooter, skateboard, etc. and avoid areas of traffic. Set your water heater to 120 degrees or less.      Seizure, Adult A seizure is a sudden burst of abnormal electrical activity in the brain. Seizures usually last from 30 seconds to 2 minutes. The abnormal activity temporarily interrupts normal brain function. A seizure can cause many different symptoms depending on where in the brain it starts. What are the causes? Common causes of this condition include:  Fever or infection.  Brain abnormality, injury, bleeding, or tumor.  Low blood sugar.  Metabolic disorders or other conditions that are passed from parent to child (are inherited).  Reaction to a substance, such as a drug or a medicine, or suddenly stopping the use of a substance (withdrawal).  Stroke.  Developmental disorders such as autism  or cerebral palsy. In some cases, the cause of this condition may not be known. Some people who have a seizure never have another one. Seizures usually do not cause brain damage or permanent problems unless they are prolonged. A person who has repeated seizures over time without a clear cause has a condition called epilepsy. What increases the risk? You are more likely to develop this condition if you have:  A family history of epilepsy.  Had a tonic-clonic seizure in the past. This is a type of seizure that involves whole-body contraction of muscles and a loss of consciousness.  Autism, cerebral palsy, or other brain disorders.  A history of head trauma, lack of oxygen at birth, or strokes. What are the signs or symptoms? There are many different types of seizures. The symptoms of a seizure vary depending on the type of seizure you have. Examples of symptoms during a seizure include:  Uncontrollable shaking (convulsions).  Stiffening of the body.  Loss of consciousness.  Head nodding.  Staring.  Not responding to sound or touch.  Loss of bladder or bowel control. Some people have symptoms right before a seizure happens (aura) and right after a seizure happens (postictal). Symptoms before a seizure may include:  Fear or anxiety.  Nausea.  Feeling like the room is spinning (vertigo).  A feeling of having seen or heard something before (dj vu).  Odd tastes or smells.  Changes in vision, such as seeing flashing lights or spots. Symptoms after a seizure may include:  Confusion.  Sleepiness.  Headache.  Weakness on one side of the body. How is this diagnosed? This condition may be diagnosed based on:  A description of your symptoms. Video of your seizures can be helpful.  Your medical history.  A physical exam. You may also have tests, including:  Blood tests.  CT scan.  MRI.  Electroencephalogram (EEG). This test measures electrical activity in the  brain. An EEG can predict whether seizures will return (recur).  A spinal tap (also called a lumbar puncture). This is the removal and testing of fluid that surrounds the brain and spinal cord. How is this treated? Most seizures will stop on their own in under 5 minutes, and no treatment is needed. Seizures that last longer than 5 minutes will usually need treatment. Treatment can include:  Medicines given through an IV.  Avoiding known triggers, such as medicines that you take for another condition.  Medicines to treat epilepsy (antiepileptics), if epilepsy caused your seizures.  Surgery to stop seizures, if you have epilepsy that does not respond to medicines. Follow these instructions at home: Medicines  Take over-the-counter and prescription medicines only as told by your health care provider.  Avoid any substances that may prevent your medicine from working properly, such as alcohol. Activity  Do not drive, swim, or do any other activities that would be dangerous if you had another seizure. Wait until your health care provider says it is safe to do them.  If you live in the [place] check with your local DMV (department of motor vehicles) to find out about local driving laws. Each state has specific rules about when you can legally return to driving.  Get enough rest. Lack of sleep can make seizures more likely to occur. Educating others Teach friends and family what to do if you have a seizure. They should:  Lay you on the ground to prevent a fall.  Cushion your head and body.  Loosen any tight clothing around your neck.  Turn you on your side. If vomiting occurs, this helps keep your airway clear.  Not hold you down. Holding you down will not stop the seizure.  Not put anything into your mouth.  Know whether or not you need emergency care. For example, they should get help right away if you have a seizure that lasts longer than 5 minutes or have several seizures in a  row.  Stay with you until you recover.  General instructions  Contact your health care provider each time you have a seizure.  Avoid anything that has ever triggered a seizure for you.  Keep a seizure diary. Record what you remember about each seizure, especially anything that might have triggered the seizure.  Keep all follow-up visits as told by your health care provider. This is important. Contact a health care provider if:  You have another seizure.  You have seizures more often.  Your seizure symptoms change.  You continue to have seizures with treatment.  You have symptoms of an infection or illness. This might increase your risk of having a seizure. Get help right away if:  You have a seizure that: ? Lasts longer than 5 minutes. ? Is different than previous seizures. ? Leaves you unable to speak or use a part of your body. ? Makes it harder to breathe.  You have: ? A seizure after a head injury. ? Multiple seizures in a row. ? Confusion or a severe headache right after a seizure.  You do not wake up immediately after  a seizure.  You injure yourself during a seizure. These symptoms may represent a serious problem that is an emergency. Do not wait to see if the symptoms will go away. Get medical help right away. Call your local emergency services (911 in the [place] Do not drive yourself to the hospital. Summary  Seizures are caused by abnormal electrical activity in the brain. The activity disrupts normal brain function and can cause various symptoms, such as convulsions, abnormal movements, or a change in consciousness.  There are many causes of seizures, including illnesses, medicines, genetic conditions, head injuries, strokes, tumors, substance abuse, or substance withdrawal.  Most seizures will stop on their own in under 5 minutes. Seizures that last longer than 5 minutes are a medical emergency and require immediate treatment.  Many medicines are used to  treat seizures. Take over-the-counter and prescription medicines only as told by your health care provider. This information is not intended to replace advice given to you by your health care provider. Make sure you discuss any questions you have with your health care provider. Document Revised: 04/30/2018 Document Reviewed: 04/30/2018 Elsevier Patient Education  2020 [employer].

## 2020-01-17 NOTE — Telephone Encounter (Signed)
Client transportation to and from the doctors appointment today was provided by congregational nurse office through [facility].Upon speaking with the client she was reluctant to going to the appointment.Upon further conversation with the client about the importance of the appointment she agreed to go to her appointment. Bianka Liberati  [facility] nurse [phone]-Office

## 2020-01-18 ENCOUNTER — Telehealth: Payer: Self-pay | Admitting: Family Medicine

## 2020-01-18 ENCOUNTER — Encounter: Payer: Self-pay | Admitting: Family Medicine

## 2020-01-18 MED ORDER — LEVETIRACETAM 1000 MG PO TABS: 1000.0000 mg | ORAL_TABLET | Freq: Two times a day (BID) | ORAL | 11 refills | Status: DC

## 2020-01-18 NOTE — Addendum Note (Signed)
Addended by: Shawnie Dapper L on: 01/18/2020 08:48 AM   Modules accepted: Level of Service

## 2020-01-18 NOTE — Telephone Encounter (Signed)
Called [facility]--they will not mail order without online form. Rx to [facility] for mail order.  [name], MD  Family Medicine Teaching Service

## 2020-01-18 NOTE — Progress Notes (Signed)
I have read the note, and I agree with the clinical assessment and plan.  Zakira Ressel K Denym Christenberry   

## 2020-01-25 ENCOUNTER — Telehealth: Payer: Self-pay | Admitting: *Deleted

## 2020-01-25 NOTE — Telephone Encounter (Signed)
Checked with [place] with [facility], feels like keppra level to be resulted tomorrow.

## 2020-01-25 NOTE — Telephone Encounter (Signed)
-----   Message from Shawnie Dapper, NP sent at 01/25/2020 10:05 AM EST ----- Andrey Campanile can you let the family know that so far labs look good. I have been waiting on her levetiracetam levels but have not received the final report. Can you check on this for me. TY!

## 2020-01-26 ENCOUNTER — Telehealth: Payer: Self-pay | Admitting: *Deleted

## 2020-01-26 LAB — CBC WITH DIFFERENTIAL/PLATELET
Basophils Absolute: 0 10*3/uL (ref 0.0–0.2)
Basos: 0 %
EOS (ABSOLUTE): 0.2 10*3/uL (ref 0.0–0.4)
Eos: 4 %
Hematocrit: 39.8 % (ref 34.0–46.6)
Hemoglobin: 13 g/dL (ref 11.1–15.9)
Immature Grans (Abs): 0 10*3/uL (ref 0.0–0.1)
Immature Granulocytes: 0 %
Lymphocytes Absolute: 2.1 10*3/uL (ref 0.7–3.1)
Lymphs: 43 %
MCH: 26.4 pg — ABNORMAL LOW (ref 26.6–33.0)
MCHC: 32.7 g/dL (ref 31.5–35.7)
MCV: 81 fL (ref 79–97)
Monocytes Absolute: 0.5 10*3/uL (ref 0.1–0.9)
Monocytes: 10 %
Neutrophils Absolute: 2.1 10*3/uL (ref 1.4–7.0)
Neutrophils: 43 %
Platelets: 242 10*3/uL (ref 150–450)
RBC: 4.92 x10E6/uL (ref 3.77–5.28)
RDW: 13 % (ref 11.7–15.4)
WBC: 4.9 10*3/uL (ref 3.4–10.8)

## 2020-01-26 LAB — COMPREHENSIVE METABOLIC PANEL
ALT: 15 IU/L (ref 0–32)
AST: 13 IU/L (ref 0–40)
Albumin/Globulin Ratio: 1.2 (ref 1.2–2.2)
Albumin: 4.2 g/dL (ref 3.9–5.0)
Alkaline Phosphatase: 67 IU/L (ref 44–121)
BUN/Creatinine Ratio: 13 (ref 9–23)
BUN: 11 mg/dL (ref 6–20)
Bilirubin Total: <0.2 mg/dL (ref 0.0–1.2)
CO2: 21 mmol/L (ref 20–29)
Calcium: 9.2 mg/dL (ref 8.7–10.2)
Chloride: 106 mmol/L (ref 96–106)
Creatinine, Ser: 0.83 mg/dL (ref 0.57–1.00)
GFR calc Af Amer: 111 mL/min/{1.73_m2} (ref >59)
GFR calc non Af Amer: 96 mL/min/{1.73_m2} (ref >59)
Globulin, Total: 3.4 g/dL (ref 1.5–4.5)
Glucose: 69 mg/dL (ref 65–99)
Potassium: 4.4 mmol/L (ref 3.5–5.2)
Sodium: 139 mmol/L (ref 134–144)
Total Protein: 7.6 g/dL (ref 6.0–8.5)

## 2020-01-26 LAB — LEVETIRACETAM LEVEL: Levetiracetam Lvl: 11.6 ug/mL (ref 10.0–40.0)

## 2020-01-26 NOTE — Telephone Encounter (Signed)
-----   Message from Amy Lomax, NP sent at 01/25/2020 10:05 AM EST ----- °Sandy can you let the family know that so far labs look good. I have been waiting on her levetiracetam levels but have not received the final report. Can you check on this for me. TY! °

## 2020-01-26 NOTE — Telephone Encounter (Signed)
I called interpreter line [phone], brother picked up but could not speak as at work,  Tied to [facility], but was not able.  Will call back on Monday.  Labs stable per AL/NP.

## 2020-01-28 NOTE — Congregational Nurse Program (Signed)
  Dept: [phone]   Congregational Nurse Program Note  Date of Encounter: 01/28/2020  Past Medical History: Past Medical History:  Diagnosis Date  . Bipolar 1 disorder (HCC)   . Cerebral palsy (HCC)   . Hypertension   . Seizures (HCC)   . Suicidal behavior     Encounter Details:  01/27/20- routine home visit. Patient mother and brother reported a seizure activity on Sunday November 28th while at church.Family reports that it was 'brief and woke up quickly".  I found Patricia [name] in the kichen cleaning and mopping the floor.She was in very good spirits and always happy to see me. I thanked her to taking her medications and encouraged her to continue the same. I confirmed that patient is taking Keppra 1000 mg BID. Sister in Energy manager administers the medication and she reports compliance.  I will continue to follow and update  Medical provider(s). Nicole Cella Masson Nalepa RN BSN PCCN  [facility] [phone]-office [phone]-cell

## 2020-01-31 ENCOUNTER — Encounter: Payer: Self-pay | Admitting: *Deleted

## 2020-01-31 NOTE — Telephone Encounter (Signed)
I called  Again and was not able to [facility] for them (as vm not set up.  Language interpreters [phone] used [place].  This was second time trying to reach will send letter.

## 2020-02-06 NOTE — Telephone Encounter (Signed)
Mailed letter 01-31-20.
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
## 2020-02-09 ENCOUNTER — Telehealth: Payer: Self-pay | Admitting: Family Medicine

## 2020-02-09 MED ORDER — LEVETIRACETAM 500 MG PO TABS: 1500.0000 mg | ORAL_TABLET | Freq: Two times a day (BID) | ORAL | 11 refills | Status: DC

## 2020-02-09 NOTE — Telephone Encounter (Signed)
-----   Message from Westley Chandler, MD sent at 02/08/2020  8:12 PM EST ----- Regarding: Seizurs Hi Ms. Kahner Yanik,  Thanks for caring for [employer] Despite the increase in her Keppra dosing (and ensured compliance thanks to mail ordering and Nicole Cella!) Sontee has had 3 seizures since Thanksgiving time. These are quite distressing to her at times----any chance we can have her follow up with your clinic a bit sooner?  Thanks for considering.  Best, Pernell Dupre, MD  Family Medicine Teaching Service

## 2020-02-09 NOTE — Telephone Encounter (Signed)
Will you ladies please make sure that Patricia [name]'s family knows to increase levetiracetam to 1500mg  twice daily. I have called in a new prescription for 500mg  tablets and she will take three tablets twice daily. , can you get her scheduled with me in 6-8 weeks. I have sent a message to Dr as well. Thank you so much!

## 2020-02-10 ENCOUNTER — Telehealth: Payer: Self-pay | Admitting: Family Medicine

## 2020-02-10 MED ORDER — LEVETIRACETAM 500 MG PO TABS: 1500.0000 mg | ORAL_TABLET | Freq: Two times a day (BID) | ORAL | 11 refills | Status: DC

## 2020-02-10 NOTE — Telephone Encounter (Signed)
Received note from patient and family they prefer mail order. Called [facility], they do not ship medications. Rx cancelled, new dose to [employer] for packaging and shipping for compliance.  [name], MD  Family Medicine Teaching Service

## 2020-02-12 NOTE — Congregational Nurse Program (Signed)
Home visit to educated patient and family after  increased dose of Keppra. Patient,sister in law and dad we present during this visit. I reviewed the medication package they currently have. They still have 500 mg and 1000 mg dosage. Sister in Energy manager understands that the dose has been increased to 1500 mg twice daily. She understands to administer 1000 mg and 500 mg pill together twice a day.  Arman Bogus RN BSN PCCN  [facility] [phone]-cell [phone]-office

## 2020-02-12 NOTE — Telephone Encounter (Signed)
Patient and family informed to increase Keppra 1500mg  BID during home visit on Friday 17th. I reviewed the pills they currently have. They have 1000mg  and 500mg  and patient sister in law understands to administer one of each  twice a day.

## 2020-02-13 NOTE — Telephone Encounter (Signed)
I donot have opening for pt at this time, placed on cancellation list for pt to call when available.

## 2020-02-20 NOTE — Telephone Encounter (Signed)
Perfect. We will keep an eye out for an opening. New RX was sent for levetiracetam 500mg  tablets that she will take 3 tablets twice daily. She can use up what she has but will take three 500mg  tablets twice daily once new rx is picked up. Thank you guys so much for all of your help!

## 2020-02-20 NOTE — Telephone Encounter (Signed)
Noted  

## 2020-02-22 NOTE — Congregational Nurse Program (Signed)
  Dept: [phone]   Congregational Nurse Program Note  Date of Encounter: 02/22/2020  Past Medical History: Past Medical History:  Diagnosis Date  . Bipolar 1 disorder (HCC)   . Cerebral palsy (HCC)   . Hypertension   . Seizures (HCC)   . Suicidal behavior     Encounter Details: 11am- Routine home visit done. Patient, sister in law and dad present during home visit. Patient is compliant with medication per family. Patient sister in law reports 2 recent  seizures one on 02/21/20 and another a few days prior.She cannot recall the date. I will communicate this with Dr Manson Passey.  Nicole Cella Shebra Muldrow Rn BSN PCCN  [facility] [phone]-cell [phone]-office

## 2020-02-29 ENCOUNTER — Telehealth: Payer: Self-pay
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
## 2020-03-01 ENCOUNTER — Telehealth: Payer: Self-pay | Admitting: Family Medicine

## 2020-03-01 NOTE — Telephone Encounter (Signed)
Called laurett, I relayed that have appt this afternoon at 1330 would that work?  She will call brothers to see if can take her.  She herself is not able to bring her at this time.  Appt held.

## 2020-03-01 NOTE — Telephone Encounter (Signed)
I called spoke to brother of pt via family member who stated next wed 03-07-20 at 0830 would be ok.  I relayed to Laurette to convey to them as well.  She stated she would.

## 2020-03-01 NOTE — Telephone Encounter (Signed)
[employer] psychologist) (refugee Case Manager, Laurett Azime) called, would like to schedule an earlier appt for the patient. She has had a seizure. Have not spoken to the patient today.  Contact Info: [phone]. Would like a call from the nurse.

## 2020-03-01 NOTE — Telephone Encounter (Signed)
Case Manager, Laurett called, brother has to work and can not bring her at 1p today. Have Pt's brother with me right now to speak with you to schedule Pt's appt. Would like a call from the nurse.

## 2020-03-07 ENCOUNTER — Ambulatory Visit: Payer: Medicaid Other | Admitting: Family Medicine

## 2020-03-07 ENCOUNTER — Encounter: Payer: Self-pay | Admitting: Family Medicine

## 2020-03-07 VITALS — BP 100/65 | HR 73 | Ht 62.0 in | Wt 185.2 lb

## 2020-03-07 DIAGNOSIS — G40909 Epilepsy, unspecified, not intractable, without status epilepticus: Secondary | ICD-10-CM | POA: Diagnosis not present

## 2020-03-07 DIAGNOSIS — F819 Developmental disorder of scholastic skills, unspecified: Secondary | ICD-10-CM

## 2020-03-07 DIAGNOSIS — Z789 Other specified health status: Secondary | ICD-10-CM | POA: Diagnosis not present

## 2020-03-07 DIAGNOSIS — F4325 Adjustment disorder with mixed disturbance of emotions and conduct: Secondary | ICD-10-CM | POA: Diagnosis not present

## 2020-03-07 NOTE — Patient Instructions (Signed)
Below is our plan:  We will continue levetiracetam 1500mg  twice daily (three 500mg  tablets twice a day). I will check labs today. After review of labs and discussion with neurologist/PCP, we will adjust plan of care accordingly. Please continue to document events and let your nurse know when these occur.   Please make sure you are staying well hydrated. I recommend 50-60 ounces daily. Well balanced diet and regular exercise encouraged.    Please continue follow up with care team as directed.   Follow up with Dr in 3 months   You may receive a survey regarding today's visit. I encourage you to leave honest feed back as I do use this information to improve patient care. Thank you for seeing me today!      Seizure, Adult A seizure is a sudden burst of abnormal electrical and chemical activity in the brain. Seizures usually last from 30 seconds to 2 minutes.  What are the causes? Common causes of this condition include:  Fever or infection.  Problems that affect the brain. These may include: ? A brain or head injury. ? Bleeding in the brain. ? A brain tumor.  Low levels of blood sugar or salt.  Kidney problems or liver problems.  Conditions that are passed from parent to child (are inherited).  Problems with a substance, such as: ? Having a reaction to a drug or a medicine. ? Stopping the use of a substance all of a sudden (withdrawal).  A stroke.  Disorders that affect how you develop. Sometimes, the cause may not be known.  What increases the risk?  Having someone in your family who has epilepsy. In this condition, seizures happen again and again over time. They have no clear cause.  Having had a tonic-clonic seizure before. This type of seizure causes you to: ? Tighten the muscles of the whole body. ? Lose consciousness.  Having had a head injury or strokes before.  Having had a lack of oxygen at birth. What are the signs or symptoms? There are many types  of seizures. The symptoms vary depending on the type of seizure you have. Symptoms during a seizure  Shaking that you cannot control (convulsions) with fast, jerky movements of muscles.  Stiffness of the body.  Breathing problems.  Feeling mixed up (confused).  Staring or not responding to sound or touch.  Head nodding.  Eyes that blink, flutter, or move fast.  Drooling, grunting, or making clicking sounds with your mouth  Losing control of when you pee or poop. Symptoms before a seizure  Feeling afraid, nervous, or worried.  Feeling like you may vomit.  Feeling like: ? You are moving when you are not. ? Things around you are moving when they are not.  Feeling like you saw or heard something before (dj vu).  Odd tastes or smells.  Changes in how you see. You may see flashing lights or spots. Symptoms after a seizure  Feeling confused.  Feeling sleepy.  Headache.  Sore muscles. How is this treated? If your seizure stops on its own, you will not need treatment. If your seizure lasts longer than 5 minutes, you will normally need treatment. Treatment may include:  Medicines given through an IV tube.  Avoiding things, such as medicines, that are known to cause your seizures.  Medicines to prevent seizures.  A device to prevent or control seizures.  Surgery.  A diet low in carbohydrates and high in fat (ketogenic diet). Follow these instructions at home:  Medicines  Take over-the-counter and prescription medicines only as told by your doctor.  Avoid foods or drinks that may keep your medicine from working, such as alcohol. Activity  Follow instructions about driving, swimming, or doing things that would be dangerous if you had another seizure. Wait until your doctor says it is safe for you to do these things.  If you live in the [place] ask your local department of motor vehicles when you can drive.  Get a lot of rest. Teaching others  Teach friends  and family what to do when you have a seizure. They should: ? Help you get down to the ground. ? Protect your head and body. ? Loosen any clothing around your neck. ? Turn you on your side. ? Know whether or not you need emergency care. ? Stay with you until you are better.  Also, tell them what not to do if you have a seizure. Tell them: ? They should not hold you down. ? They should not put anything in your mouth.   General instructions  Avoid anything that gives you seizures.  Keep a seizure diary. Write down: ? What you remember about each seizure. ? What you think caused each seizure.  Keep all follow-up visits. Contact a doctor if:  You have another seizure or seizures. Call the doctor each time you have a seizure.  The pattern of your seizures changes.  You keep having seizures with treatment.  You have symptoms of being sick or having an infection.  You are not able to take your medicine. Get help right away if:  You have any of these problems: ? A seizure that lasts longer than 5 minutes. ? Many seizures in a row and you do not feel better between seizures. ? A seizure that makes it harder to breathe. ? A seizure and you can no longer speak or use part of your body.  You do not wake up right after a seizure.  You get hurt during a seizure.  You feel confused or have pain right after a seizure. These symptoms may be an emergency. Get help right away. Call your local emergency services (911 in the [place]  Do not wait to see if the symptoms will go away.  Do not drive yourself to the hospital. Summary  A seizure is a sudden burst of abnormal electrical and chemical activity in the brain. Seizures normally last from 30 seconds to 2 minutes.  Causes of seizures include illness, injury to the head, low levels of blood sugar or salt, and certain conditions.  Most seizures will stop on their own in less than 5 minutes. Seizures that last longer than 5 minutes  are a medical emergency and need treatment right away.  Many medicines are used to treat seizures. Take over-the-counter and prescription medicines only as told by your doctor. This information is not intended to replace advice given to you by your health care provider. Make sure you discuss any questions you have with your health care provider. Document Revised: 08/19/2019 Document Reviewed: 08/19/2019 Elsevier Patient Education  2021 [employer].
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
## 2020-03-08 ENCOUNTER — Other Ambulatory Visit: Payer: Self-pay | Admitting: Family Medicine

## 2020-03-08 ENCOUNTER — Encounter: Payer: Self-pay | Admitting: Family Medicine

## 2020-03-08 MED ORDER — LAMOTRIGINE 25 MG PO TABS: ORAL_TABLET | ORAL | 0 refills | Status: DC

## 2020-03-09 ENCOUNTER — Telehealth: Payer: Self-pay | Admitting: Family Medicine

## 2020-03-09 MED ORDER — LAMOTRIGINE 25 MG PO TABS: ORAL_TABLET | ORAL | 0 refills | Status: DC

## 2020-03-09 NOTE — Progress Notes (Signed)
I have read the note, and I agree with the clinical assessment and plan.  Charles K Willis   

## 2020-03-09 NOTE — Telephone Encounter (Signed)
Lamictal to [facility] for shipping.   Patricia Starr, MD  Family Medicine Teaching Service

## 2020-03-10 LAB — COMPREHENSIVE METABOLIC PANEL
ALT: 16 IU/L (ref 0–32)
AST: 17 IU/L (ref 0–40)
Albumin/Globulin Ratio: 1.2 (ref 1.2–2.2)
Albumin: 4.2 g/dL (ref 3.9–5.0)
Alkaline Phosphatase: 70 IU/L (ref 44–121)
BUN/Creatinine Ratio: 10 (ref 9–23)
BUN: 9 mg/dL (ref 6–20)
Bilirubin Total: 0.3 mg/dL (ref 0.0–1.2)
CO2: 19 mmol/L — ABNORMAL LOW (ref 20–29)
Calcium: 9.6 mg/dL (ref 8.7–10.2)
Chloride: 110 mmol/L — ABNORMAL HIGH (ref 96–106)
Creatinine, Ser: 0.89 mg/dL (ref 0.57–1.00)
GFR calc Af Amer: 102 mL/min/{1.73_m2} (ref >59)
GFR calc non Af Amer: 88 mL/min/{1.73_m2} (ref >59)
Globulin, Total: 3.4 g/dL (ref 1.5–4.5)
Glucose: 82 mg/dL (ref 65–99)
Potassium: 4.5 mmol/L (ref 3.5–5.2)
Sodium: 141 mmol/L (ref 134–144)
Total Protein: 7.6 g/dL (ref 6.0–8.5)

## 2020-03-10 LAB — CBC WITH DIFFERENTIAL/PLATELET
Basophils Absolute: 0 10*3/uL (ref 0.0–0.2)
Basos: 1 %
EOS (ABSOLUTE): 0.2 10*3/uL (ref 0.0–0.4)
Eos: 5 %
Hematocrit: 40.7 % (ref 34.0–46.6)
Hemoglobin: 13.2 g/dL (ref 11.1–15.9)
Immature Grans (Abs): 0 10*3/uL (ref 0.0–0.1)
Immature Granulocytes: 0 %
Lymphocytes Absolute: 1.5 10*3/uL (ref 0.7–3.1)
Lymphs: 45 %
MCH: 26.3 pg — ABNORMAL LOW (ref 26.6–33.0)
MCHC: 32.4 g/dL (ref 31.5–35.7)
MCV: 81 fL (ref 79–97)
Monocytes Absolute: 0.6 10*3/uL (ref 0.1–0.9)
Monocytes: 18 %
NRBC: 1 % — ABNORMAL HIGH (ref 0–0)
Neutrophils Absolute: 1 10*3/uL — ABNORMAL LOW (ref 1.4–7.0)
Neutrophils: 31 %
Platelets: 264 10*3/uL (ref 150–450)
RBC: 5.02 x10E6/uL (ref 3.77–5.28)
RDW: 13.9 % (ref 11.7–15.4)
WBC: 3.3 10*3/uL — ABNORMAL LOW (ref 3.4–10.8)

## 2020-03-10 LAB — LEVETIRACETAM LEVEL: Levetiracetam Lvl: 19.6 ug/mL (ref 10.0–40.0)

## 2020-03-10 NOTE — Congregational Nurse Program (Signed)
03/09/2020 1300 hrs-  Home visit completed. Met with patient dad and sister in law. Patricia [name] was upstairs resting.I informed the family that Rand`s doctor has ordered new medication Lamictal. She will start with 25 mg twice a day for 2 weeks, increase to 50 mg  twice a day for the next 2 weeks and 75 mg mg for the next 2 weeks and thereafter continue with a maintenance dosage. Sister in law [place] verbalized understanding. I requested [name] to inform me when the medication package is  delivered. I will review the medications with her again. I will note the start  date and set a reminder for her to increase the dose after every 2 weeks.I also emphasized that she will continue taking Keppra as previously ordered.She verbalized understanding.  Honor Loh RN BSn PCCN  [facility] [phone]

## 2020-03-13 ENCOUNTER — Telehealth: Payer: Self-pay

## 2020-03-13 NOTE — Telephone Encounter (Signed)
I have contacted patient sister in law [name]. She stated that Lamotrigine not yet delivered. She will give me a call as soon as it is delivered.  Arman Bogus RN BSn PCCN  [facility] [phone] [phone]-office

## 2020-03-15 ENCOUNTER — Telehealth: Payer: Self-pay | Admitting: *Deleted

## 2020-03-15 NOTE — Telephone Encounter (Signed)
-----   Message from Shawnie Dapper, NP sent at 03/08/2020  4:26 PM EST ----- Please let the family know that her labs are stable. I am still waiting on levetiracetam level. I would like to start her on lamotrigine as well. She will titrate this dose every two weeks. She will start with 25mg  (1 tablet) twice daily for two weeks then increase dose to 50mg  (2 tablets) twice daily for 2 weeks, then increase dose to 75mg  (three tablets) twice daily. Once she starts the second week of 75mg  dosing, have the family let me know how she is doing. If doing well, we will continue maintenance dose of 100mg  twice daily. I will call in a new tablet (lamotrigine 100mg ) at that time. I am going to also send note to PCP and to congregational nurse as well as she plans to see the family tomorrow. Please let me know if there are any questions or concerns.

## 2020-03-15 NOTE — Telephone Encounter (Signed)
CCN saw pt and family 03-10-20 relayed instructions on lamictal titration and keep keppra same dosing.  Will let her know when medication arrives and reiterate instructions.

## 2020-03-16 NOTE — Congregational Nurse Program (Signed)
  Dept: [place] Nurse Program Note  Date of Encounter: 03/16/2020  Past Medical History: Past Medical History:  Diagnosis Date  . Bipolar 1 disorder ([place])   . Cerebral palsy ([place])   . Hypertension   . Seizures ([place])   . Suicidal behavior     Encounter Details:  Patient sister in law informed me that medication has been delivered. I visited with the family and met with sister in law [place]. We discussed about Lamictal how and when to take it. We inspected the medication and noted that they are labelled week 1, week 2, week 3 etc. Week 1&2 is packaged with 3 keppra $Remove'1500mg'[id number]$  + 1 Lamictal 25 mg to take by mouth 2 times a day.We put all other weeks packages aside.  Patient to start taking on Sunday January 23 rd. Sister in law Cecil verbalized understanding. She was able to provide teach back.  Honor Loh RN BSn PCCN  [facility] [phone]

## 2020-03-18 ENCOUNTER — Telehealth: Payer: Self-pay

## 2020-03-18 NOTE — Telephone Encounter (Signed)
Call placed to patient sister in law [name] with reminder to start medications labelled week 1. Blister pack has lamictal 25mg  + Keppra 1500mg  to take twice a day.  RN BSn PCCN  [facility] [phone]-cell [phone]-office

## 2020-03-21 ENCOUNTER — Telehealth: Payer: Self-pay

## 2020-03-21 NOTE — Telephone Encounter (Signed)
Called to check on how Patricia [name] is doing with new medication Lamictal. Sister in law reported that Patricia [name] is doing well. No seizure or behavioral issues reported. She reported that Patricia [name] looks calm and that she is taking medication well.She will increase lamictal dose on February 7th. I will  Call her with a reminder.  Arman Bogus RN BSn PCCN  [facility] [phone]-cell [phone]-office

## 2020-03-25 ENCOUNTER — Telehealth: Payer: Self-pay
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
## 2020-03-26 ENCOUNTER — Telehealth: Payer: Self-pay

## 2020-03-26 NOTE — Telephone Encounter (Signed)
Patient family has contacted me regarding seizures activity. So far, patient has experienced  2 recent seizures on 03/25/20 and 03/26/20.I have informed Dr Manson Passey via email.  Arman Bogus RN BSn PCCN  [facility] [phone]-cell [phone]-office

## 2020-03-27 ENCOUNTER — Telehealth: Payer: Self-pay | Admitting: Pediatric Intensive Care

## 2020-03-27 NOTE — Telephone Encounter (Signed)
Call to [facility]- spoke with Joselyn Glassman to make referral for IDD assessment and case management. CN explained that client has a guardian through DSS and gave name of guardian. CN will be POC for information regarding IDD referral. Darrell Jewel BSN CNP [phone]

## 2020-03-28 NOTE — Congregational Nurse Program (Signed)
  Dept: [phone]   Congregational Nurse Program Note  Date of Encounter: 03/27/2020  Past Medical History: Past Medical History:  Diagnosis Date  . Bipolar 1 disorder (HCC)   . Cerebral palsy (HCC)   . Hypertension   . Seizures (HCC)   . Suicidal behavior     Encounter Details:  Home visit completed on 03/27/2020.   Patient was back in a jovial mood. Her brother reported that she had not taken her medication over the weekend which upset the patient. I reviewed the medication blister pack and noted 3 days worth of medication was not taken (Monday, Friday, Saturday). These dates correspond with dates that she had behavioral crises and she ended up having 2 seizures. I discussed the importance of compliance with the patient and her family.   Today (03/28/20) I spoke with the patient's father who reported that the patient is doing well. No behavioral issues or seizures reported. Patient has been taking her medication. I suggested that the patient's mother give the patient her medication prior to leaving for work as well as prior to the patient going to bed to ensure compliance. I also stressed the importance of ensuring that the patient is actually swallowing her pills each time. I have discussed the events of this past weekend with patient's PCP (Dr. Manson Passey).   Arman Bogus RN BSn PCCN  [facility] [phone]-cell [phone]-office

## 2020-04-02 ENCOUNTER — Telehealth: Payer: Self-pay

## 2020-04-02 NOTE — Telephone Encounter (Signed)
Called patient family to check on medication compliance. Sister in law reports that she has been compliant and no seizures reported.I reminded family that Shaquandra will increase the dose of Lamictal and start taking 2 whites pills(lamital 50mg ) and Keppra Pills will remain the same 3 keppra int he blister pack.  RN BSn PCCN  [facility] [phone]-cell [phone]-office

## 2020-04-10 ENCOUNTER — Telehealth: Payer: Self-pay | Admitting: Pediatric Intensive Care

## 2020-04-10 NOTE — Telephone Encounter (Signed)
Return call to [employer] with [employer]. CN reached out to [facility] due to family given notice to vacate housing on 05/03/20. Ms Mayford Knife states that she can forward list of available properties. CN will continue contact with [facility]. [name] RN BSN CNP [phone]

## 2020-04-16 ENCOUNTER — Telehealth: Payer: Self-pay
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
## 2020-04-18 ENCOUNTER — Ambulatory Visit: Payer: Medicaid Other | Admitting: Family Medicine

## 2020-04-19 ENCOUNTER — Telehealth: Payer: Self-pay
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
## 2020-04-20 ENCOUNTER — Telehealth: Payer: Self-pay

## 2020-04-20 NOTE — Telephone Encounter (Signed)
FL2 Form placed in batch scanning. Spoke with PCP who secure emailed to appropriate person.

## 2020-04-21 NOTE — Congregational Nurse Program (Signed)
  Dept: [phone]   Congregational Nurse Program Note  Date of Encounter: 04/21/2020  Past Medical History: Past Medical History:  Diagnosis Date  . Bipolar 1 disorder (HCC)   . Cerebral palsy (HCC)   . Hypertension   . Seizures (HCC)   . Suicidal behavior     Encounter Details:  Routine home visit completed. Inspected patient medications. Patient has not taken mediation for a number of days. She has medications that are left over. The family reports that she is compliant but it looks like she has missed some days. Family reeducated on importance of medication compliance.  Arman Bogus RN BSn PCCN  [facility] [phone]-cell [phone]-office

## 2020-05-08 ENCOUNTER — Telehealth: Payer: Self-pay
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
## 2020-05-09 ENCOUNTER — Telehealth: Payer: Self-pay

## 2020-05-09 NOTE — Telephone Encounter (Signed)
Patient and family moved to a new address. I have called pharmacy and updated address.  Jasia Hiltunen RN BSn PCCN  [facility] [phone]-cell [phone]-office   

## 2020-05-11 ENCOUNTER — Emergency Department (HOSPITAL_COMMUNITY)
Admission: EM | Admit: 2020-05-11 | Discharge: 2020-05-12 | Disposition: A | Discharge status: Home / Self Care | Payer: Medicaid Other | Attending: Emergency Medicine | Admitting: Emergency Medicine

## 2020-05-11 ENCOUNTER — Encounter (HOSPITAL_COMMUNITY): Payer: Self-pay | Admitting: Emergency Medicine

## 2020-05-11 ENCOUNTER — Other Ambulatory Visit: Payer: Self-pay

## 2020-05-11 DIAGNOSIS — I10 Essential (primary) hypertension: Secondary | ICD-10-CM | POA: Insufficient documentation

## 2020-05-11 DIAGNOSIS — Z20822 Contact with and (suspected) exposure to covid-19: Secondary | ICD-10-CM | POA: Diagnosis not present

## 2020-05-11 DIAGNOSIS — R197 Diarrhea, unspecified: Secondary | ICD-10-CM | POA: Insufficient documentation

## 2020-05-11 DIAGNOSIS — Z79899 Other long term (current) drug therapy: Secondary | ICD-10-CM | POA: Diagnosis not present

## 2020-05-11 DIAGNOSIS — R45851 Suicidal ideations: Secondary | ICD-10-CM | POA: Diagnosis not present

## 2020-05-11 DIAGNOSIS — F4325 Adjustment disorder with mixed disturbance of emotions and conduct: Secondary | ICD-10-CM | POA: Diagnosis present

## 2020-05-11 DIAGNOSIS — G809 Cerebral palsy, unspecified: Secondary | ICD-10-CM | POA: Diagnosis present

## 2020-05-11 LAB — COMPREHENSIVE METABOLIC PANEL
ALT: 15 U/L (ref 0–44)
AST: 17 U/L (ref 15–41)
Albumin: 4.2 g/dL (ref 3.5–5.0)
Alkaline Phosphatase: 60 U/L (ref 38–126)
Anion gap: 7 (ref 5–15)
BUN: 14 mg/dL (ref 6–20)
CO2: 20 mmol/L — ABNORMAL LOW (ref 22–32)
Calcium: 9.4 mg/dL (ref 8.9–10.3)
Chloride: 113 mmol/L — ABNORMAL HIGH (ref 98–111)
Creatinine, Ser: 0.86 mg/dL (ref 0.44–1.00)
GFR, Estimated: >60 mL/min (ref >60)
Glucose, Bld: 84 mg/dL (ref 70–99)
Potassium: 3.7 mmol/L (ref 3.5–5.1)
Sodium: 140 mmol/L (ref 135–145)
Total Bilirubin: 0.4 mg/dL (ref 0.3–1.2)
Total Protein: 8.1 g/dL (ref 6.5–8.1)

## 2020-05-11 LAB — CBC WITH DIFFERENTIAL/PLATELET
Abs Immature Granulocytes: 0.01 10*3/uL (ref 0.00–0.07)
Basophils Absolute: 0 10*3/uL (ref 0.0–0.1)
Basophils Relative: 0 %
Eosinophils Absolute: 0.2 10*3/uL (ref 0.0–0.5)
Eosinophils Relative: 3 %
HCT: 40.4 % (ref 36.0–46.0)
Hemoglobin: 12.7 g/dL (ref 12.0–15.0)
Immature Granulocytes: 0 %
Lymphocytes Relative: 42 %
Lymphs Abs: 2 10*3/uL (ref 0.7–4.0)
MCH: 26 pg (ref 26.0–34.0)
MCHC: 31.4 g/dL (ref 30.0–36.0)
MCV: 82.8 fL (ref 80.0–100.0)
Monocytes Absolute: 0.4 10*3/uL (ref 0.1–1.0)
Monocytes Relative: 8 %
Neutro Abs: 2.2 10*3/uL (ref 1.7–7.7)
Neutrophils Relative %: 47 %
Platelets: 232 10*3/uL (ref 150–400)
RBC: 4.88 MIL/uL (ref 3.87–5.11)
RDW: 14.4 % (ref 11.5–15.5)
WBC: 4.7 10*3/uL (ref 4.0–10.5)
nRBC: 0 % (ref 0.0–0.2)

## 2020-05-11 LAB — RESP PANEL BY RT-PCR (FLU A&B, COVID) ARPGX2
Influenza A by PCR: NEGATIVE
Influenza B by PCR: NEGATIVE
SARS Coronavirus 2 by RT PCR: NEGATIVE

## 2020-05-11 LAB — I-STAT BETA HCG BLOOD, ED (MC, WL, AP ONLY): I-stat hCG, quantitative: <5 m[IU]/mL (ref <5)

## 2020-05-11 LAB — ACETAMINOPHEN LEVEL: Acetaminophen (Tylenol), Serum: <10 ug/mL — ABNORMAL LOW (ref 10–30)

## 2020-05-11 LAB — SALICYLATE LEVEL: Salicylate Lvl: <7 mg/dL — ABNORMAL LOW (ref 7.0–30.0)

## 2020-05-11 LAB — ETHANOL: Alcohol, Ethyl (B): <10 mg/dL (ref <10)

## 2020-05-11 MED ORDER — LEVETIRACETAM 500 MG PO TABS
1500.0000 mg | ORAL_TABLET | Freq: Two times a day (BID) | ORAL | Status: DC
  Administered 2020-05-11 – 2020-05-12 (×2): 1500 mg via ORAL
  Filled 2020-05-11 (×2): qty 3

## 2020-05-11 MED ORDER — TRAZODONE HCL 50 MG PO TABS
50.0000 mg | ORAL_TABLET | Freq: Every day | ORAL | Status: AC
  Administered 2020-05-11: 50 mg via ORAL
  Filled 2020-05-11: qty 1
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
## 2020-05-11 NOTE — ED Notes (Signed)
Pt has one pt bag with her cloths and shoes in it.

## 2020-05-11 NOTE — ED Notes (Signed)
Pt alert speaks Swahili video interpreter at bedside. I will continue to monitor.

## 2020-05-12 DIAGNOSIS — F4325 Adjustment disorder with mixed disturbance of emotions and conduct: Secondary | ICD-10-CM | POA: Diagnosis not present

## 2020-05-12 DIAGNOSIS — G809 Cerebral palsy, unspecified: Secondary | ICD-10-CM | POA: Diagnosis present

## 2020-05-12 NOTE — ED Notes (Signed)
Called pt sister and let her know pt discharged. Pt sister immediately hung up.
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
## 2020-05-12 NOTE — BH Assessment (Signed)
Comprehensive Clinical Assessment (CCA) Note  05/12/2020 Patricia [name] [id number]  Chief Complaint:  Chief Complaint  Patient presents with  . Suicidal  . Hallucinations   Visit Diagnosis:   F33.2 Major depressive disorder, Recurrent episode, Severe    Flowsheet Row [facility]  C-SSRS RISK CATEGORY High Risk Error: Question 6 not populated High Risk      The patient demonstrates the following risk factors for suicide: Chronic risk factors for suicide include psychiatric disordered of MDD previous sucide attempts today and 30 years old ws found laying in the middle of the road, due to grieving father's death   Acute risk factors for suicide include: family or marital conflict and social withdrawal/isolation. Protective factors for this patient include: positive social support, positive therapeutic relationship, coping skills and hope for the future. Considering these factors, the overall suicide risk at this point appears to be Patient  appropriate for outpatient follow up.  Therefore, a 1.1 sitter for suicide precautions is recommended.  Disposition Ene Ajibolar NP, recommends overnight observation and be reassessed psychiatry.  Disposition discussed with Magazine features editorChristine RN, via secure chat in Epic; also interpretor is needed.  RN to discuss disposition with EDP  Patricia [name] is a 30 years old female who [facility].  Per EMS " she was found in middle of the road, family could not find patient in the house, found her laying in the middle of the road, stated that she was trying to kill herself".  Pt presents with a translator at patient requests; also, provided collateral information, Patricia DonathLalia [name], [phone], unable to contact or leave hippa  message.  Pt reports "I wanted to die, I want to go and be with my father, I wanted a car to hit me so I can follow my dad".   Pt reports the following symptoms: daily crying spells, social withdrawal, and loss of interests.  Pt reports that she does not know about sleep, "my sister don't like me, they don't let me sleep, they make me sleep on the floor".  Pt reports that she is hungry and she wants to go home to her child.  Pt denies HI.  Pt denies paranoia.  Pt deneis any history of auditory or visual hallucinations.  Pt reports that she does not drink alcohol or use any substance used.  Pt identifies her primary stressor as been shun by the family and children " they insult me and keep calling me names"; also " tolerating an old man who talks bad to both me and my mother, I don't llike that old man talking to my mother, dad is dead".  Pt reports that she live with her family and daughter, they are her sole support.  Pt denies history of mental illness and substance used.  Pt denies any history of abuse or trauma.  Pt denis any current legal problems.  Pt reports no guns in the house.    Pt says she is not currently receiving weekly outpatient therapy; also admitted to not taking medication management.  Pt reports no previous inpatient psychiatric hospitalization.  Pt reports she has been diagnosed with seizures.  Pt is dressed in scrubs, alert,oriented x 2 with loud speech and restless motor behavior.  Eye contact is good and Pt is tearful.  Pt mood is depressed and affect  is anxious.  Thought process suspicious.  Pt insight is lacking and judgment is fair.  There is no indication Pt is currently responding to internal stimuli or experiencing delusional thought content.  Pt was defensive throughout assessment.  CCA Screening, Triage and Referral (STR)  Patient Reported Information How did you hear about [name]? Self (EMS)  Referral name: EMS  Referral phone number: No data recorded  Whom do you see  for routine medical problems? Other (Comment) (Pt unable to share doctor name)  Practice/Facility Name: No data recorded Practice/Facility Phone Number: No data recorded Name of Contact: No data recorded Contact Number: No data recorded Contact Fax Number: No data recorded Prescriber Name: No data recorded Prescriber Address (if known): No data recorded  What Is the Reason for Your Visit/Call Today? SI  How Long Has This Been Causing You Problems? 1 wk - 1 month  What Do You Feel Would Help You the Most Today? Treatment for Depression or other mood problem; Medication(s)   Have You Recently Been in Any Inpatient Treatment (Hospital/Detox/Crisis Center/28-Day Program)? No  Name/Location of Program/Hospital:No data recorded How Long Were You There? No data recorded When Were You Discharged? No data recorded  Have You Ever Received Services From [facility] Before? Yes  Who Do You See at [facility]? 09/18/2019   Have You Recently Had Any Thoughts About Hurting Yourself? Yes  Are You Planning to Commit Suicide/Harm Yourself At This time? Yes   Have you Recently Had Thoughts About Hurting Someone Patricia [name]? No  Explanation: No data recorded  Have You Used Any Alcohol or Drugs in the Past 24 Hours? No  How Long Ago Did You Use Drugs or Alcohol? No data recorded What Did You Use and How Much? No data recorded  Do You Currently Have a Therapist/Psychiatrist? No (Pt is presenting memory deficits)  Name of Therapist/Psychiatrist: No data recorded  Have You Been Recently Discharged From Any Office Practice or Programs? No  Explanation of Discharge From Practice/Program: No data recorded    CCA Screening Triage Referral Assessment Type of Contact: Tele-Assessment  Is this Initial or Reassessment? Initial Assessment  Date Telepsych consult ordered in [facility]:  05/11/2020  Time Telepsych consult ordered in [facility]:  1442   Patient Reported Information Reviewed? Yes  Patient Left  Without Being Seen? No  Reason for Not Completing Assessment: Pt was given injections and was unable to be aroused   Collateral Involvement: Interpreter, unable to contact colleratle information from sister, Patricia [name], [phone]   Does Patient Have a Court Appointed Legal Guardian? No data recorded Name and Contact of Legal Guardian: self  If Minor and Not Living with Parent(s), Who has Custody? n/a  Is CPS involved or ever been involved? Never  Is APS involved or ever been involved? -- (UTA)   Patient Determined To Be At Risk for Harm To Self or Others Based on Review of Patient Reported Information or Presenting Complaint? Yes, for Self-Harm  Method: No data recorded Availability of Means: No data recorded Intent: No data recorded Notification Required: No data recorded Additional Information for Danger to Others Potential: No data recorded Additional Comments for Danger to Others Potential: No data recorded Are There Guns or Other Weapons in Your Home? No  Types of Guns/Weapons: No data recorded Are These Weapons Safely Secured?                            No data recorded Who  Could Verify You Are Able To Have These Secured: No data recorded Do You Have any Outstanding Charges, Pending Court Dates, Parole/Probation? No data recorded Contacted To Inform of Risk of Harm To Self or Others: Unable to Contact: (Law enforcement/EMS have been notified.)   Location of Assessment: [facility]   Does Patient Present under Involuntary Commitment? No  IVC Papers Initial File Date: 09/18/2019   [place] of Residence: [place]   Patient Currently Receiving the Following Services: Not Receiving Services   Determination of Need: Emergent (2 hours)   Options For Referral: Inpatient Hospitalization; Outpatient Therapy; Medication Management     CCA Biopsychosocial Intake/Chief Complaint:  Depression  Current Symptoms/Problems: Crying, irritable, isolating,  worrying   Patient Reported Schizophrenia/Schizoaffective Diagnosis in Past: No   Strengths: UTA  Preferences: UTA  Abilities: UTA   Type of Services Patient Feels are Needed: UTA   Initial Clinical Notes/Concerns: isolating   Mental Health Symptoms Depression:  Change in energy/activity; Difficulty Concentrating; Fatigue; Hopelessness; Tearfulness   Duration of Depressive symptoms: No data recorded  Mania:  None   Anxiety:   Difficulty concentrating; Fatigue; Irritability; Restlessness; Sleep; Worrying   Psychosis:  Grossly disorganized or catatonic behavior   Duration of Psychotic symptoms: Less than six months   Trauma:  Avoids reminders of event (Pt reports that she thinks about the dealth of father)   Obsessions:  None   Compulsions:  None   Inattention:  None   Hyperactivity/Impulsivity:  N/A   Oppositional/Defiant Behaviors:  None   Emotional Irregularity:  Chronic feelings of emptiness; Frantic efforts to avoid abandonment; Unstable self-image   Other Mood/Personality Symptoms:  Depressed irritable mood    Mental Status Exam Appearance and self-care  Stature:  Average   Weight:  Overweight   Clothing:  -- (Pt dressed in scrubs)   Grooming:  Normal   Cosmetic use:  Age appropriate   Posture/gait:  Normal   Motor activity:  Not Remarkable   Sensorium  Attention:  Confused   Concentration:  Preoccupied   Orientation:  Object; Person   Recall/memory:  Defective in Recent   Affect and Mood  Affect:  Anxious; Tearful   Mood:  Angry   Relating  Eye contact:  None   Facial expression:  Angry   Attitude toward examiner:  Defensive   Thought and Language  Speech flow: Loud   Thought content:  Suspicious   Preoccupation:  Suicide   Hallucinations:  -- (UTA)   Organization:  No data recorded  [employer] of Knowledge:  Fair   Intelligence:  Below average   Abstraction:  Normal   Judgement:  Fair    Dance movement psychotherapist:  Distorted   Insight:  Lacking   Decision Making:  Confused   Social Functioning  Social Maturity:  Irresponsible   Social Judgement:  Impropriety   Stress  Stressors:  Family conflict; Housing   Coping Ability:  Exhausted   Skill Deficits:  Self-care; Self-control   Supports:  Support needed     Religion: Religion/Spirituality Are You A Religious Person?: No How Might This Affect Treatment?: UTA  Leisure/Recreation: Leisure / Recreation Do You Have Hobbies?: No  Exercise/Diet: Exercise/Diet Do You Exercise?: No Have You Gained or Lost A Significant Amount of Weight in the Past Six Months?: No Do You Follow a Special Diet?: No Do You Have Any Trouble Sleeping?: Yes Explanation of Sleeping Difficulties: Pt reports not sleeping   CCA Employment/Education Employment/Work Situation: Employment / Work Situation Employment situation:  Unemployed  Education:     CCA Family/Childhood History Family and Relationship History: Family history Are you sexually active?:  (UTA) What is your sexual orientation?: UTA Has your sexual activity been affected by drugs, alcohol, medication, or emotional stress?: UfTA Does patient have children?: Yes How many children?: 1 How is patient's relationship with their children?: yes  Childhood History:  Childhood History By whom was/is the patient raised?: Mother Additional childhood history information: Pt reports that her father died, she is very sad Description of patient's relationship with caregiver when they were a child: UTA Patient's description of current relationship with people who raised him/her: UTA How were you disciplined when you got in trouble as a child/adolescent?: UTA Does patient have siblings?: Yes Number of Siblings: 2 Description of patient's current relationship with siblings: distance Did patient suffer any verbal/emotional/physical/sexual abuse as a child?: No Did patient suffer  from severe childhood neglect?: No Has patient ever been sexually abused/assaulted/raped as an adolescent or adult?:  (UTA) Witnessed domestic violence?: No Has patient been affected by domestic violence as an adult?:  Industrial/product designer)  Child/Adolescent Assessment:     CCA Substance Use Alcohol/Drug Use: Alcohol / Drug Use Pain Medications: See MRA Prescriptions: See MRA Over the Counter: See MRA                         ASAM's:  Six Dimensions of Multidimensional Assessment  Dimension 1:  Acute Intoxication and/or Withdrawal Potential:      Dimension 2:  Biomedical Conditions and Complications:      Dimension 3:  Emotional, Behavioral, or Cognitive Conditions and Complications:     Dimension 4:  Readiness to Change:     Dimension 5:  Relapse, Continued use, or Continued Problem Potential:     Dimension 6:  Recovery/Living Environment:     ASAM Severity Score:    ASAM Recommended Level of Treatment:     Substance use Disorder (SUD)    Recommendations for Services/Supports/Treatments:    DSM5 Diagnoses: Patient Active Problem List   Diagnosis Date Noted  . Other social stressor 09/28/2019  . Severe episode of recurrent major depressive disorder, without psychotic features (HCC)   . Adjustment disorder 07/13/2019  . Refugee health examination 07/16/2017  . Language barrier 07/16/2017  . Seizure disorder (HCC) 07/16/2017  . Hepatitis B 07/16/2017  . Adjustment disorder with mixed disturbance of emotions and conduct 07/11/2017      Referrals to Alternative Service(s): Referred to Alternative Service(s):   Place:   Date:   Time:    Referred to Alternative Service(s):   Place:   Date:   Time:    Referred to Alternative Service(s):   Place:   Date:   Time:    Referred to Alternative Service(s):   Place:   Date:   Time:     Meryle Ready, Counselor

## 2020-05-12 NOTE — ED Notes (Signed)
Family currently refusing to pick patient up from hospital Determined via sister that family is home and can let patient inside house Per charge nurse, patient can be sent home via taxi voucher Patient given belongings and allowed to get dressed

## 2020-05-12 NOTE — Discharge Instructions (Signed)
Follow up with out patient provider

## 2020-05-12 NOTE — ED Notes (Signed)
Patient placed in taxi by this RN Copy of voucher retained and given to charge RN

## 2020-05-12 NOTE — ED Provider Notes (Signed)
Emergency Medicine Observation Re-evaluation Note  Patricia [name] is a 30 y.o. female, seen on rounds today.  Pt initially presented to the ED for complaints of Suicidal and Hallucinations Currently, the patient is sleeping.  Physical Exam  BP 102/68 (BP Location: Left Arm)   Pulse 68   Temp 98.2 F (36.8 C) (Oral)   Resp 18   Ht 5\' 2"  (1.575 m)   Wt 84 kg   SpO2 95%   BMI 33.87 kg/m  Physical Exam General: nad Lungs: no distress Psych: calm  ED Course / MDM   I have reviewed the labs performed to date as well as medications administered while in observation.   Plan  Current plan is for reassessment by psychiatry. Patient is not under full IVC at this time.   , MD 05/12/20 [phone]

## 2020-05-12 NOTE — ED Notes (Signed)
This RN spoke with patient's sister [name] and informed her that patient needed to be picked up as she has been discharged Family member is difficult to understand due to accent Family member stated she had to call additional family to come and pick patient up

## 2020-05-12 NOTE — ED Notes (Signed)
Per charge nurse, [employer] was called to contact family of patient Per dispatcher, [employer] will be sent to family's home to do wellness check/tell family to pick up patient from ED

## 2020-05-13 ENCOUNTER — Telehealth: Payer: Self-pay

## 2020-05-13 ENCOUNTER — Emergency Department (HOSPITAL_COMMUNITY)
Admission: EM | Admit: 2020-05-13 | Discharge: 2020-05-13 | Disposition: A | Discharge status: Home / Self Care | Payer: Medicaid Other | Attending: Emergency Medicine | Admitting: Emergency Medicine

## 2020-05-13 ENCOUNTER — Other Ambulatory Visit: Payer: Self-pay

## 2020-05-13 ENCOUNTER — Encounter (HOSPITAL_COMMUNITY): Payer: Self-pay

## 2020-05-13 DIAGNOSIS — F4325 Adjustment disorder with mixed disturbance of emotions and conduct: Secondary | ICD-10-CM | POA: Diagnosis present

## 2020-05-13 DIAGNOSIS — R45851 Suicidal ideations: Secondary | ICD-10-CM | POA: Insufficient documentation

## 2020-05-13 DIAGNOSIS — Z046 Encounter for general psychiatric examination, requested by authority: Secondary | ICD-10-CM | POA: Diagnosis present

## 2020-05-13 DIAGNOSIS — F4320 Adjustment disorder, unspecified: Secondary | ICD-10-CM

## 2020-05-13 LAB — COMPREHENSIVE METABOLIC PANEL
ALT: 13 U/L (ref 0–44)
AST: 20 U/L (ref 15–41)
Albumin: 3.7 g/dL (ref 3.5–5.0)
Alkaline Phosphatase: 50 U/L (ref 38–126)
Anion gap: 12 (ref 5–15)
BUN: 14 mg/dL (ref 6–20)
CO2: 16 mmol/L — ABNORMAL LOW (ref 22–32)
Calcium: 8.8 mg/dL — ABNORMAL LOW (ref 8.9–10.3)
Chloride: 110 mmol/L (ref 98–111)
Creatinine, Ser: UNDETERMINED mg/dL (ref 0.44–1.00)
Glucose, Bld: 81 mg/dL (ref 70–99)
Potassium: 3.8 mmol/L (ref 3.5–5.1)
Sodium: 138 mmol/L (ref 135–145)
Total Bilirubin: 0.6 mg/dL (ref 0.3–1.2)
Total Protein: 7.3 g/dL (ref 6.5–8.1)

## 2020-05-13 LAB — CBC WITH DIFFERENTIAL/PLATELET
Abs Immature Granulocytes: 0.01 10*3/uL (ref 0.00–0.07)
Basophils Absolute: 0 10*3/uL (ref 0.0–0.1)
Basophils Relative: 1 %
Eosinophils Absolute: 0.2 10*3/uL (ref 0.0–0.5)
Eosinophils Relative: 4 %
HCT: 39.9 % (ref 36.0–46.0)
Hemoglobin: 12.4 g/dL (ref 12.0–15.0)
Immature Granulocytes: 0 %
Lymphocytes Relative: 48 %
Lymphs Abs: 2.6 10*3/uL (ref 0.7–4.0)
MCH: 26.6 pg (ref 26.0–34.0)
MCHC: 31.1 g/dL (ref 30.0–36.0)
MCV: 85.4 fL (ref 80.0–100.0)
Monocytes Absolute: 0.5 10*3/uL (ref 0.1–1.0)
Monocytes Relative: 9 %
Neutro Abs: 2 10*3/uL (ref 1.7–7.7)
Neutrophils Relative %: 38 %
Platelets: 219 10*3/uL (ref 150–400)
RBC: 4.67 MIL/uL (ref 3.87–5.11)
RDW: 14.1 % (ref 11.5–15.5)
WBC: 5.2 10*3/uL (ref 4.0–10.5)
nRBC: 0 % (ref 0.0–0.2)

## 2020-05-13 LAB — RAPID URINE DRUG SCREEN, HOSP PERFORMED
Amphetamines: NOT DETECTED
Barbiturates: NOT DETECTED
Benzodiazepines: POSITIVE — AB
Cocaine: NOT DETECTED
Opiates: NOT DETECTED
Tetrahydrocannabinol: NOT DETECTED

## 2020-05-13 LAB — ACETAMINOPHEN LEVEL: Acetaminophen (Tylenol), Serum: <10 ug/mL — ABNORMAL LOW (ref 10–30)

## 2020-05-13 LAB — HCG, QUANTITATIVE, PREGNANCY: hCG, Beta Chain, Quant, S: <1 m[IU]/mL (ref <5)

## 2020-05-13 LAB — ETHANOL: Alcohol, Ethyl (B): <10 mg/dL (ref <10)

## 2020-05-13 LAB — SALICYLATE LEVEL: Salicylate Lvl: <7 mg/dL — ABNORMAL LOW (ref 7.0–30.0)

## 2020-05-13 MED ORDER — LEVETIRACETAM 500 MG PO TABS
1500.0000 mg | ORAL_TABLET | Freq: Two times a day (BID) | ORAL | Status: DC
  Administered 2020-05-13: 1500 mg via ORAL
  Filled 2020-05-13: qty 3

## 2020-05-13 MED ORDER — LORAZEPAM 2 MG/ML IJ SOLN
2.0000 mg | Freq: Once | INTRAMUSCULAR | Status: AC
  Administered 2020-05-13: 2 mg via INTRAMUSCULAR

## 2020-05-13 MED ORDER — LORAZEPAM 2 MG/ML IJ SOLN
2.0000 mg | Freq: Once | INTRAMUSCULAR | Status: DC
  Filled 2020-05-13: qty 1

## 2020-05-13 MED ORDER — HALOPERIDOL LACTATE 5 MG/ML IJ SOLN
5.0000 mg | Freq: Once | INTRAMUSCULAR | Status: AC
  Administered 2020-05-13: 5 mg via INTRAMUSCULAR
  Filled 2020-05-13: qty 1

## 2020-05-13 MED ORDER — RISPERIDONE 0.5 MG PO TABS: 0.5000 mg | ORAL_TABLET | Freq: Every day | ORAL | 1 refills | Status: DC | PRN

## 2020-05-13 MED ORDER — DIPHENHYDRAMINE HCL 50 MG/ML IJ SOLN
50.0000 mg | Freq: Once | INTRAMUSCULAR | Status: DC
  Filled 2020-05-13: qty 1

## 2020-05-13 MED ORDER — LAMOTRIGINE 25 MG PO TABS
75.0000 mg | ORAL_TABLET | Freq: Two times a day (BID) | ORAL | Status: DC
Start: 2020-05-13 — End: 2020-05-13
  Administered 2020-05-13: 75 mg via ORAL
  Filled 2020-05-13: qty 3

## 2020-05-13 MED ORDER — RISPERIDONE 0.5 MG PO TABS: 0.5000 mg | ORAL_TABLET | Freq: Two times a day (BID) | ORAL | Status: DC

## 2020-05-13 MED ORDER — RISPERIDONE 0.5 MG PO TABS
0.5000 mg | ORAL_TABLET | Freq: Every day | ORAL | Status: DC | PRN
  Filled 2020-05-13: qty 1

## 2020-05-13 NOTE — ED Provider Notes (Addendum)
[facility]-EMERGENCY DEPT Provider Note   CSN: [id number] Arrival date & time: 05/13/20  0121     History Chief Complaint  Patient presents with  . Suicidal    Patricia [name] is a 30 y.o. female hx of bipolar, HTN, seizure, here with sitting in traffic. Patient lives at home with family. States that she doesn't get along with family. She was found sitting on the road 2 days ago and was brought to the ED. She was seen by psych and was discharged yesterday. However, she went home and then immediately sat on the road in the traffic so EMS was called. No traumatic injuries. No seizures reported.  With the use of translator phone, patient states that she was just mad at her family.  She is actually now trying to kill herself.  She has no homicidal ideation.  She states that she just wants to go home now  A language interpreter was used.  Swahili interpreter used      Past Medical History:  Diagnosis Date  . Bipolar 1 disorder (HCC)   . Cerebral palsy (HCC)   . Hypertension   . Seizures (HCC)   . Suicidal behavior     Patient Active Problem List   Diagnosis Date Noted  . Cerebral palsy (HCC) 05/12/2020  . Refugee health examination 07/16/2017  . Language barrier 07/16/2017  . Seizure disorder (HCC) 07/16/2017  . Hepatitis B 07/16/2017  . 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 No pertinent surgical history.   OB History    Gravida  1   Para  1   Term  1   Preterm      AB      Living  1     SAB      IAB      Ectopic      Multiple      Live Births              Family History  Problem Relation Age of Onset  . Hepatitis Brother     Social History   Tobacco Use  . Smoking status: Never Smoker  . Smokeless tobacco: Never Used  Vaping Use  . Vaping Use: Unknown  Substance Use Topics  . Alcohol use: Yes    Alcohol/week: 2.0 standard drinks    Types: 2 Cans of beer  per week    Comment: occasional alcohol use  . Drug use: Never    Home Medications Prior to Admission medications   Medication Sig Start Date End Date Taking? Authorizing Provider  lamoTRIgine (LAMICTAL) 25 MG tablet 25mg  BID x 2 weeks, then 50mg  BID x 2 weeks, then 75mg  BID x 2 weeks Patient not taking: No sig reported 03/09/20   , MD  levETIRAcetam (KEPPRA) 500 MG tablet Take 3 tablets (1,500 mg total) by mouth 2 (two) times daily. 02/10/20   03/11/20, MD    Allergies    Patient has no known allergies.  Review of Systems   Review of Systems  Psychiatric/Behavioral: Positive for dysphoric mood.  All other systems reviewed and are negative.   Physical Exam Updated Vital Signs Ht 5\' 2"  (1.575 m)   Wt 84 kg   BMI 33.87 kg/m   Physical Exam Vitals and nursing note reviewed.  Constitutional:      Comments: Depressed   HENT:     Head: Normocephalic and atraumatic.  Nose: Nose normal.     Mouth/Throat:     Mouth: Mucous membranes are moist.  Eyes:     Extraocular Movements: Extraocular movements intact.     Pupils: Pupils are equal, round, and reactive to light.  Cardiovascular:     Rate and Rhythm: Normal rate and regular rhythm.     Pulses: Normal pulses.     Heart sounds: Normal heart sounds.  Pulmonary:     Effort: Pulmonary effort is normal.     Breath sounds: Normal breath sounds.  Abdominal:     General: Abdomen is flat.     Palpations: Abdomen is soft.  Musculoskeletal:        General: Normal range of motion.     Cervical back: Normal range of motion and neck supple.  Skin:    General: Skin is warm.     Capillary Refill: Capillary refill takes less than 2 seconds.  Neurological:     General: No focal deficit present.  Psychiatric:     Comments: Depressed      ED Results / Procedures / Treatments   Labs (all labs ordered are listed, but only abnormal results are displayed) Labs Reviewed  CBC WITH DIFFERENTIAL/PLATELET   COMPREHENSIVE METABOLIC PANEL  ETHANOL  SALICYLATE LEVEL  ACETAMINOPHEN LEVEL  RAPID URINE DRUG SCREEN, HOSP PERFORMED  I-STAT BETA HCG BLOOD, ED ([facility], [facility], [facility] ONLY)    EKG None  Radiology No results found.  Procedures Procedures   Medications Ordered in ED Medications - No data to display  ED Course  I have reviewed the triage vital signs and the nursing notes.  Pertinent labs & imaging results that were available during my care of the patient were reviewed by me and considered in my medical decision making (see chart for details).    MDM Rules/Calculators/A&P                         Patricia [name] is a 30 y.o. female here with sitting on the middle of the road.  Patient states that she was just mad with family.  She was just seen by behavioral health earlier yesterday.  She was cleared by behavioral health.  Is thought to have adjustment disorder and just acting out.  Patient adamantly denies any suicidal homicidal ideation.  I tried to asked her to see psychiatry but she refused as she just saw psychiatry yesterday.  Patient states that she just wants to go home.   5:33 AM We were unable to reach any family member to get her home.  Patient then tried to just throw herself the floor and not following commands.  She is not redirectable.  She is agitated and yelling.  At this point, I will IVC her.  Patient will Ativan Haldol and Benadryl.  Will have psych reassess her.  Final Clinical Impression(s) / ED Diagnoses Final diagnoses:  None    Rx / DC Orders ED Discharge Orders    None       Charlynne Pander, MD 05/13/20 0414    Charlynne Pander, MD 05/13/20 [phone]

## 2020-05-13 NOTE — ED Notes (Signed)
Patient running down the hall way trying to leave.

## 2020-05-13 NOTE — Social Work (Signed)
CSW spoke with [facility] Nurse Arman Bogus, who is familiar with the family.  Nurse Muhoro reached the Pt's mother by phone and the family will be coming to pick up Pt. CSW updated floor RN

## 2020-05-13 NOTE — ED Notes (Signed)
Attempted to use [facility] for interpreter service. Call rang for 10 minutes and then cancelled because no [place] interpreter was available. Will try again for a third attempt in 1 hour.

## 2020-05-13 NOTE — ED Notes (Signed)
Attempted to do TTS consult. Pt very drowsy and unable to speak at this time. Will message TTS when pt more awake.

## 2020-05-13 NOTE — Discharge Instructions (Signed)
Please take your seizure medicine as prescribed.  See your counselor  When you are upset please do not try and sit in the middle of the road.  Return to ER if you have thoughts of harming herself or others, hallucinations  Please follow-up with the resources listed below for Therapy/Medication Management   [facility]-will provide timely access to mental health services for children and adolescents (4-17) and adults presenting in a mental health crisis. The program is designed for those who need urgent Behavioral Health or Substance Use treatment and are not experiencing a medical crisis that would typically require an emergency room visit.    [address] [place], [place] [address] Phone: [phone] [email or web address]   [facility] will also offer the following outpatient services: (Monday through Friday 8am-5pm)    Partial Hospitalization Program (PHP)  Substance Abuse Intensive Outpatient Program (SA-IOP)  Group Therapy  Medication Management  Peer Living Room   We also provide (24/7):    Assessments: Our mental health clinician and providers will conduct a focused mental health evaluation, assessing for immediate safety concerns and further mental health needs.   Referral: Our team will provide resources and help connect to community based mental health treatment, when indicated, including psychotherapy, psychiatry, and other specialized behavioral health or substance use disorder services (for those not already in treatment).   Transitional Care: Our team providers in person bridging and/or telphonic follow-up during the patient's transition to outpatient services.   Congregational Nurse Program  Our Congregational Nurse Program is a unique, specialized nursing practice established as a collaborative relationship between [facility] and our area's faith communities. This partnership recognizes that religious organizations have  a long history of promoting healing and caregiving. We established our Engineer, water Program in 1998 with grants from the [facility]. Our approach provides for a congregational coordinator based at [facility] who is responsible for assisting community congregations with developing and implementing a Journalist, newspaper. Each health ministry is tailored to meet individual congregations' needs and capabilities. Currently, we collaborate with 48 faith communities, all of which have either a paid or Printmaker. Roles and Activities Our congregational nurses provide a range of services to their faith communities such as: Marland Kitchen Personal health counseling. While congregational nurses do not perform "hands-on" nursing care such as wound care or injections, they discuss health concerns with parishioners and visit with them as needed. Marland Kitchen Health education. Congregational nurses promote an understanding of the relationship between lifestyle, attitudes, faith and well-being. Marland Kitchen Referral assistance. Congregational nurses serve as a Theatre stage manager for area health resources and services for their faith community. . Recruiting volunteers. As facilitators, they often recruit and coordinate volunteers and support groups within their congregations. The role of congregational nurses is varied and includes: . Listening. . Assessing health needs for the faith community. . Providing screening clinics. . Making home, hospital or nursing home visits. . Referring parishioners to appropriate health or social services agencies. . Offering educational programs to address specific congregational needs. . Coordinating volunteers for supportive ministries. . Networking with other congregational nurses to increase knowledge. Program Objectives Our objectives are to: Marland Kitchen Enhance the health status of people served by [facility] through outreach. . Promote harmony of body, mind and spirit in achieving  and maintaining individual health with a focus on disease prevention and reducing health risk behaviors. . Facilitate an active partnership between [employer], faith communities, agencies and the community at large to [employer],  chronic disease management and healthier lifestyles. Contact For more information, call:  [phone]

## 2020-05-13 NOTE — ED Notes (Addendum)
[facility] interpreter device called. No Swaheli interpreter available for at least the next 30 minutes. TTS cannot be performed without this. Dr. Silverio Lay notified.

## 2020-05-13 NOTE — ED Notes (Signed)
Patient keeps throwing herself on the floor multiple times. Patients mattress moved to floor for safety. Patient told TTS yesterday that at home her mattress is on the floor. Patient was spinning in circles on the floor aggressively trying to hit anything that was touching the ground. All objects moved out of the patients room to ensure patient remains safe here.

## 2020-05-13 NOTE — Progress Notes (Signed)
CSW provided the patient with Mental health resources to [facility] outpatient services for therapy/medication management and to the Congregational Nursing Program/Phone#: [phone] to her chart.   Crissie Reese, MSW, LCSW-A, LCAS-A Phone: [phone] Disposition/TOC

## 2020-05-13 NOTE — ED Triage Notes (Addendum)
Patient BIB [facility] picked up from middle of the road in [place]. Patient says she wants to die because her dads dead so she wants to die too. Patient speaks no English, only Swaheli.   EMS gave 5mg  Versed IM

## 2020-05-13 NOTE — ED Notes (Signed)
Attempted x2 to obtain blood work. This writer was only able to obtain a lavender top tube.

## 2020-05-13 NOTE — ED Notes (Signed)
Patient having temper tantrum on the floor.

## 2020-05-13 NOTE — ED Notes (Signed)
Patient ambulated to bathroom and would not go back into her room. Dr. Darl Householder met patient in the hallway, translator service on the phone used and it was discussed that the patient "wanted to go home and patient said she would not lay in the street when she became mad again." Dr. Darl Householder putting in discharge orders.

## 2020-05-13 NOTE — Telephone Encounter (Signed)
I have contacted patient mother to inform her that patient is discharged and ready for pick up. Patient mother verbalized understanding and patient will be picked up by his 2 brothers within an hour.  Arman Bogus RN [facility]  [facility] [phone]-cell [phone]-office

## 2020-05-13 NOTE — Progress Notes (Signed)
Psychiatrically cleared as her behaviors are not related to psychiatric issues.  Patient continues to have behavior issues related to her low threshold for frustration.  Often acts out when she does not want to do something.  PRN Risperdal prescribed per family's request.  Not threat to self or others at this time.  She wants to live with her mother but this is not possible.  Her PCP/case manager is seeking placement and a  FL2 paperwork completed on 04/20/20, congregational nursing is involved.  Nanine Means, PMHNP

## 2020-05-13 NOTE — BH Assessment (Signed)
TTS consultation was canceled. Pt was discharged Per Dr. Silverio Lay.

## 2020-05-13 NOTE — BH Assessment (Addendum)
@   29[address] Felipa Furnace, RN, noted: "Attempted to use [facility] for interpreter service. Call rang for 10 minutes and then cancelled because no Swaheli interpreter was available. Will try again for a third attempt in 1 hour."   @ [phone], TTS follow up by contacting patient's nurse to request an update on the availability of a interpreter that patient will need in order to complete her TTS assessment.   @ 0820, Nursing set up TTS machine and Interpreter Machine/Wally. Clinician attempted to assess patient for approximately 10 minutes with the assistance of interpretation. Patient was observantly drowsy, unable to keep eyes open, and did not respond to any questions asked by this Clinician. She appeared to be sleeping as she was sitting in a chair. Requested nursing to assist with waking patient up. However, patient continued to display difficulty in staying alert and awake. Nursing asked to contact TTS when patient awakens and is alert enough to complete her TTS assessment.

## 2020-05-13 NOTE — ED Provider Notes (Signed)
Pt assessed by psychiatry team. Per Nanine Means, NP, she does not meet inpatient criteria. Catha Nottingham spoke w/ family who requested prn meds for agitation, which she provided. IVC rescinded prior to d/c.    Jessy Calixte, Ambrose Finland, MD 05/13/20 1041

## 2020-05-13 NOTE — ED Notes (Signed)
Patients blood pressure is soft. Dr. Silverio Lay notified and gave verbal order to give Ativan and Haldol but hold the Benadryl.

## 2020-05-13 NOTE — BH Assessment (Signed)
TTS was informed that Pt's Nurse, trying to located an interpretor to assist with assessment.

## 2020-05-13 NOTE — ED Notes (Signed)
Charge RN aware of situation about patients family members not picking up the phone.

## 2020-05-13 NOTE — Telephone Encounter (Signed)
I have contacted patient family and spoke with patient mother,step dad and brother.Patient has visited ED twice after sitting down in the middle of the road with intention of killing herself.This family is well known to me. Family has recently moved to a new home after eviction from pervious home.Patient has been acting out more since the move.Family has a lot of stressors including health issues with step dad and younger brother with cerebral palsy.I will continue to follow up and offer support including transportation assistance as needed. I can be reached on Cell [phone]  Levis Nazir RN [facility] P[facility]N  [facility] [phone]-cell [phone]-office

## 2020-05-13 NOTE — ED Notes (Signed)
RN called the mobile and home phone number left in the patients chart, Patricia [name], sisters number, and the 2 brothers numbers in the chart. Patricia [name]'s phone number in the chart was the wrong number. Patricia [name], Diplomatic Services operational officer at [facility] ED left message on Patricia [name]'s phone number about the patient being discharged and that Patricia [name] needs to confirm that someone will be there to let patient inside.

## 2020-05-14 ENCOUNTER — Other Ambulatory Visit: Payer: Self-pay

## 2020-05-14 ENCOUNTER — Telehealth: Payer: Self-pay

## 2020-05-14 ENCOUNTER — Ambulatory Visit (HOSPITAL_COMMUNITY)
Admission: EM | Admit: 2020-05-14 | Discharge: 2020-05-17 | Disposition: A | Discharge status: Home / Self Care | Payer: Medicaid Other | Attending: Nurse Practitioner | Admitting: Nurse Practitioner

## 2020-05-14 ENCOUNTER — Ambulatory Visit (HOSPITAL_COMMUNITY)
Admission: EM | Admit: 2020-05-14 | Discharge: 2020-05-14 | Disposition: A | Discharge status: Home / Self Care | Payer: Medicaid Other | Source: Home / Self Care

## 2020-05-14 DIAGNOSIS — F4323 Adjustment disorder with mixed anxiety and depressed mood: Secondary | ICD-10-CM

## 2020-05-14 DIAGNOSIS — G40909 Epilepsy, unspecified, not intractable, without status epilepticus: Secondary | ICD-10-CM | POA: Insufficient documentation

## 2020-05-14 DIAGNOSIS — Z20822 Contact with and (suspected) exposure to covid-19: Secondary | ICD-10-CM | POA: Insufficient documentation

## 2020-05-14 DIAGNOSIS — X838XXA Intentional self-harm by other specified means, initial encounter: Secondary | ICD-10-CM

## 2020-05-14 DIAGNOSIS — F4325 Adjustment disorder with mixed disturbance of emotions and conduct: Secondary | ICD-10-CM | POA: Insufficient documentation

## 2020-05-14 DIAGNOSIS — R45851 Suicidal ideations: Secondary | ICD-10-CM | POA: Insufficient documentation

## 2020-05-14 DIAGNOSIS — Z789 Other specified health status: Secondary | ICD-10-CM

## 2020-05-14 MED ORDER — RISPERIDONE 0.5 MG PO TABS
0.5000 mg | ORAL_TABLET | Freq: Every day | ORAL | Status: DC | PRN
  Filled 2020-05-14: qty 1

## 2020-05-14 MED ORDER — LEVETIRACETAM 500 MG PO TABS
500.0000 mg | ORAL_TABLET | Freq: Once | ORAL | Status: AC
  Administered 2020-05-14: 500 mg via ORAL
  Filled 2020-05-14: qty 1

## 2020-05-14 MED ORDER — MAGNESIUM HYDROXIDE 400 MG/5ML PO SUSP: 30.0000 mL | Freq: Every day | ORAL | Status: DC | PRN

## 2020-05-14 MED ORDER — ACETAMINOPHEN 325 MG PO TABS: 650.0000 mg | ORAL_TABLET | Freq: Four times a day (QID) | ORAL | Status: DC | PRN

## 2020-05-14 MED ORDER — LEVETIRACETAM 500 MG PO TABS
1500.0000 mg | ORAL_TABLET | Freq: Two times a day (BID) | ORAL | Status: DC
  Administered 2020-05-15 – 2020-05-16 (×4): 1500 mg via ORAL
  Filled 2020-05-14 (×5): qty 3

## 2020-05-14 MED ORDER — ALUM & MAG HYDROXIDE-SIMETH 200-200-20 MG/5ML PO SUSP: 30.0000 mL | ORAL | Status: DC | PRN

## 2020-05-14 NOTE — ED Provider Notes (Signed)
Behavioral Health Urgent Care Medical Screening Exam  Patient Name: Patricia [name] MRN: [id number] Date of Evaluation: 05/14/20 Chief Complaint:   Diagnosis:  Final diagnoses:  Adjustment disorder with mixed disturbance of emotions and conduct  Seizure disorder (HCC)    History of Present illness: Patricia [name] is a 30 y.o. female.  Patient [facility] to the [facility] accompanied by law enforcement with IVC paperwork showing up approximately 20 minutes after she arrived.  IVC paperwork reports that low enforcement was contacted by family with reports that the patient was lying in the road wanting to get ran over.  Based off of the IVC paperwork does not appear that any of the law enforcement that did the IVC paperwork witnessed this information.  Patient reports, through the use of Swahili interpreter, that she was not trying to kill herself and that she does not want to die.  She denies any suicidal or homicidal ideations and denies any hallucinations.  She reports that her living situation is not good.  She states that she likes living with her brother but the person that lives with him is always swearing at her and he was no one telling her to go lay on the road because she would be better off if she got hit by a car.  She states that she does not want that to happen and feels that she would be better if she were to live in a refugee camp.  Patient states that she likes living with her brother and she plans to return there.  Patient has had numerous visits to the ED with similar situations of disruptive family dynamics.  Patient cannot live with her mother and has been staying with her brother.  Patient reports that she cannot live on her own because she cannot afford it.  Patient does not appear to be a threat to herself or to anyone else and does not meet any inpatient psychiatric treatment criteria.  Patient does request a dose of her Keppra for her seizure disorder which she is prescribed Keppra  500 mg p.o. 3 times daily.  One-time dose of Keppra 500 mg has been ordered for patient.  Patient's IVC paperwork will be rescinded and patient will be discharged home.  Patient is instructed to continue her current home medications.  Patient is also provided with outpatient resources for the [facility] for open access.  Psychiatric Specialty Exam  Presentation  General Appearance:Appropriate for Environment; Casual  Eye Contact:Fair  Speech:Clear and Coherent; Normal Rate  Speech Volume:Normal  Handedness:Right   Mood and Affect  Mood:Euthymic  Affect:Appropriate; Congruent   Thought Process  Thought Processes:Coherent  Descriptions of Associations:Intact  Orientation:Full (Time, Place and Person)  Thought Content:WDL  Diagnosis of Schizophrenia or Schizoaffective disorder in past: No  Duration of Psychotic Symptoms: Less than six months  Hallucinations:None  Ideas of Reference:None  Suicidal Thoughts:No  Homicidal Thoughts:No   Sensorium  Memory:Immediate Good; Recent Good; Remote Good  Judgment:Intact  Insight:Fair   Executive Functions  Concentration:Good  Attention Span:Good  Recall:Good  Fund of Knowledge:Fair  Language:Good   Psychomotor Activity  Psychomotor Activity:Normal   Assets  Assets:Communication Skills; Desire for Improvement; Financial Resources/Insurance; Housing; Social Support; Physical Health   Sleep  Sleep:Good  Number of hours: No data recorded  No data recorded  Physical Exam: Physical Exam Vitals and nursing note reviewed.  Constitutional:      Appearance: She is well-developed.  HENT:     Head: Normocephalic.  Eyes:  Pupils: Pupils are equal, round, and reactive to light.  Cardiovascular:     Rate and Rhythm: Normal rate.  Pulmonary:     Effort: Pulmonary effort is normal.  Musculoskeletal:        General: Normal range of motion.  Neurological:     Mental Status: She is alert and oriented to person,  place, and time.    Review of Systems  Constitutional: Negative.   HENT: Negative.   Eyes: Negative.   Respiratory: Negative.   Cardiovascular: Negative.   Gastrointestinal: Negative.   Genitourinary: Negative.   Musculoskeletal: Negative.   Skin: Negative.   Neurological: Negative.   Endo/Heme/Allergies: Negative.   Psychiatric/Behavioral: Negative.    There were no vitals taken for this visit. There is no height or weight on file to calculate BMI.  Musculoskeletal: Strength & Muscle Tone: within normal limits Gait & Station: normal Patient leans: N/A   [facility] MSE Discharge Disposition for Follow up and Recommendations: Based on my evaluation the patient does not appear to have an emergency medical condition and can be discharged with resources and follow up care in outpatient services for Medication Management and Individual Therapy   Maryfrances Bunnell, FNP 05/14/2020, 4:59 PM

## 2020-05-14 NOTE — Discharge Instructions (Signed)

## 2020-05-14 NOTE — Telephone Encounter (Addendum)
Transition Care Management Unsuccessful Follow-up Telephone Call  Date of discharge and from where:  05/13/2020 from [facility]  Attempts:  1st Attempt  Reason for unsuccessful TCM follow-up call:  Unable to reach patient   Pt is back in the hospital today.

## 2020-05-14 NOTE — ED Notes (Signed)
Patient alert and verbal. Speaks no English; interpreter used to inform patient of COVID test. Patient refused to have labs and urine collected. Provider aware.

## 2020-05-14 NOTE — Discharge Summary (Signed)
Patricia [name] to be D/C'd home per NP order. Discussed with the patient and all questions fully answered. An After Visit Summary was printed and given to the patient.  Patient escorted out and D/C home via [employer]. Dickie La  05/14/2020 5:42 PM

## 2020-05-14 NOTE — ED Provider Notes (Signed)
Behavioral Health Admission [facility] & OBS)  Date: 05/15/20 Patient Name: Patricia [name] MRN: [id number] Chief Complaint: No chief complaint on file.  Chief Complaint/Presenting Problem: According to the police, pt attempted to run into traffic in an effort to harm herself.  Diagnoses:  Final diagnoses:  Suicide gesture, initial encounter ([place])  Adjustment disorder with mixed disturbance of emotions and conduct    HPI: Patricia [name] is a 30 y.o. female with a history of behavioral disturbance who presents to [facility] voluntarily with law enforcement.  Patient was evaluated at [facility] earlier today and discharged.  [employer] reports that when they escorted the patient home she stated that she wanted to kill herself and attempted to run into traffic traffic, but was tackled by [employer] before making into the road.  [employer] returned the patient to be [facility] for assessment.  Patient evaluated using electronic interpreter service.  Patient states multiple times during the assessment that she does not like the old man that is living with her mother.  She acknowledges suicidal ideations with plans to run into traffic.  Patient denies homicidal ideations.  When asked about auditory and visual hallucinations, patient did not provide a clear answer and again repeated that she did not like the old man living with her mother.  She does not appear to be responding to internal stimuli at this time.  Patient is requesting discharge.  Due to patient's report of suicidal ideations, suicidal gesture, and second presentation to [facility] today for similar behavior, patient was petitioned for IVC by this provider.  PHQ 2-9:  [place] Office Visit from 10/20/2019 in [place] ED from 09/18/2019 in [place]  Thoughts that you would be better off dead, or of hurting yourself in some way Several days Nearly every day  PHQ-9 Total Score 11 [place] ED from  05/14/2020 in [facility] ED from 05/13/2020 in [place] DEPT ED from 05/11/2020 in [place] DEPT  C-SSRS RISK CATEGORY High Risk High Risk High Risk       Total Time spent with patient: 30 minutes  Musculoskeletal  Strength & Muscle Tone: within normal limits Gait & Station: normal Patient leans: N/A  Psychiatric Specialty Exam  Presentation General Appearance: Casual  Eye Contact:Fair  Speech:Clear and Coherent; Normal Rate  Speech Volume:Normal  Handedness:Right   Mood and Affect  Mood:Anxious  Affect:Congruent   Thought Process  Thought Processes:Coherent  Descriptions of Associations:Circumstantial  Orientation:Full (Time, Place and Person)  Thought Content:Rumination  Diagnosis of Schizophrenia or Schizoaffective disorder in past: No  Duration of Psychotic Symptoms: Less than six months  Hallucinations:Hallucinations: None  Ideas of Reference:None  Suicidal Thoughts:Suicidal Thoughts: Yes, Active SI Active Intent and/or Plan: With Intent; With Plan; With Means to [place]  Homicidal Thoughts:Homicidal Thoughts: No   Sensorium  Memory:Immediate Good  Judgment:Impaired  Insight:Lacking   Executive Functions  Concentration:Fair  Attention Span:Fair  [place]  Language:Good   Psychomotor Activity  Psychomotor Activity:Psychomotor Activity: Normal   Assets  Assets:Desire for Improvement; Financial Resources/Insurance; Housing; Physical Health   Sleep  Sleep:Sleep: Good   Nutritional Assessment (For OBS and FBC admissions only) Has the patient had a weight loss or gain of 10 pounds or more in the last 3 months?: No Has the patient had a decrease in food intake/or appetite?: No Does the patient have dental problems?: No Does  the patient have eating habits or behaviors that may be indicators of an eating disorder  including binging or inducing vomiting?: No Has the patient recently lost weight without trying?: No Has the patient been eating poorly because of a decreased appetite?: No Malnutrition Screening Tool Score: 0    Physical Exam Vitals reviewed.  Constitutional:      General: She is not in acute distress.    Appearance: She is not ill-appearing, toxic-appearing or diaphoretic.  HENT:     Head: Normocephalic.     Right Ear: External ear normal.     Left Ear: External ear normal.  Eyes:     Conjunctiva/sclera: Conjunctivae normal.     Pupils: Pupils are equal, round, and reactive to light.  Cardiovascular:     Rate and Rhythm: Normal rate.  Pulmonary:     Effort: Pulmonary effort is normal. No respiratory distress.  Musculoskeletal:        General: Normal range of motion.  Skin:    General: Skin is warm and dry.  Neurological:     Mental Status: She is alert and oriented to person, place, and time.  Psychiatric:        Mood and Affect: Mood is anxious and depressed.        Thought Content: Thought content is not paranoid or delusional. Thought content includes suicidal ideation. Thought content does not include homicidal ideation. Thought content includes suicidal plan.    Review of Systems  Constitutional: Negative for chills, diaphoresis, fever, malaise/fatigue and weight loss.  HENT: Negative for congestion.   Respiratory: Negative for cough and shortness of breath.   Cardiovascular: Negative for chest pain and palpitations.  Gastrointestinal: Negative for diarrhea, nausea and vomiting.  Neurological: Positive for seizures (history of seizures). Negative for dizziness.  Psychiatric/Behavioral: Positive for depression and suicidal ideas. Negative for hallucinations, memory loss and substance abuse. The patient is nervous/anxious and has insomnia.   All other systems reviewed and are negative.   Blood pressure 104/66, pulse 64, temperature 97.8 F (36.6 C), temperature  source Oral, resp. rate 16, SpO2 100 %. There is no height or weight on file to calculate BMI.  Past Psychiatric History:   Is the patient at risk to self? Yes  Has the patient been a risk to self in the past 6 months? Yes .    Has the patient been a risk to self within the distant past? Yes   Is the patient a risk to others? No   Has the patient been a risk to others in the past 6 months? No   Has the patient been a risk to others within the distant past? No   Past Medical History:  Past Medical History:  Diagnosis Date  . Bipolar 1 disorder ([place])   . Cerebral palsy ([place])   . Hypertension   . Seizures ([place])   . Suicidal behavior    No past surgical history on file.  Family History:  Family History  Problem Relation Age of Onset  . Hepatitis Brother     Social History:  Social History   Socioeconomic History  . Marital status: Single    Spouse name: Not on file  . Number of children: 1  . Years of education: Not on file  . Highest education level: Not on file  Occupational History    Comment: none  Tobacco Use  . Smoking status: Never Smoker  . Smokeless tobacco: Never Used  Vaping Use  . Vaping Use: Unknown  Substance and Sexual Activity  . Alcohol use: Yes    Alcohol/week: 2.0 standard drinks    Types: 2 Cans of beer per week    Comment: occasional alcohol use  . Drug use: Never  . Sexual activity: Not Currently  Other Topics Concern  . Not on file  Social History Narrative   ** Merged History Encounter **       ** Merged History Encounter **       ** Merged History Encounter **       Lives with mother, family   Social Determinants of Health   Financial Resource Strain: Not on file  Food Insecurity: Not on file  Transportation Needs: Not on file  Physical Activity: Not on file  Stress: Not on file  Social Connections: Not on file  Intimate Partner Violence: Not on file    SDOH:  SDOH Screenings   Alcohol Screen: Low Risk   . Last Alcohol  Screening Score (AUDIT): 0  Depression (PHQ2-9): Medium Risk  . PHQ-2 Score: 11  Financial Resource Strain: Not on file  Food Insecurity: Not on file  Housing: Not on file  Physical Activity: Not on file  Social Connections: Not on file  Stress: Not on file  Tobacco Use: Low Risk   . Smoking Tobacco Use: Never Smoker  . Smokeless Tobacco Use: Never Used  Transportation Needs: Not on file    Last Labs:  Admission on 05/14/2020  Component Date Value Ref Range Status  . SARS Coronavirus 2 by RT PCR 05/14/2020 NEGATIVE  NEGATIVE Final   Comment: (NOTE) SARS-CoV-2 target nucleic acids are NOT DETECTED.  The SARS-CoV-2 RNA is generally detectable in upper respiratory specimens during the acute phase of infection. The lowest concentration of SARS-CoV-2 viral copies this assay can detect is 138 copies/mL. A negative result does not preclude SARS-Cov-2 infection and should not be used as the sole basis for treatment or other patient management decisions. A negative result may occur with  improper specimen collection/handling, submission of specimen other than nasopharyngeal swab, presence of viral mutation(s) within the areas targeted by this assay, and inadequate number of viral copies(<138 copies/mL). A negative result must be combined with clinical observations, patient history, and epidemiological information. The expected result is Negative.  Fact Sheet for Patients:  EntrepreneurPulse.com.au  Fact Sheet for Healthcare Providers:  IncredibleEmployment.be  This test is no                          t yet approved or cleared by the [place] FDA and  has been authorized for detection and/or diagnosis of SARS-CoV-2 by FDA under an Emergency Use Authorization (EUA). This EUA will remain  in effect (meaning this test can be used) for the duration of the COVID-19 declaration under Section 564(b)(1) of the Act, 21 U.S.C.section 360bbb-3(b)(1),  unless the authorization is terminated  or revoked sooner.      . Influenza A by PCR 05/14/2020 NEGATIVE  NEGATIVE Final  . Influenza B by PCR 05/14/2020 NEGATIVE  NEGATIVE Final   Comment: (NOTE) The Xpert Xpress SARS-CoV-2/FLU/RSV plus assay is intended as an aid in the diagnosis of influenza from Nasopharyngeal swab specimens and should not be used as a sole basis for treatment. Nasal washings and aspirates are unacceptable for Xpert Xpress SARS-CoV-2/FLU/RSV testing.  Fact Sheet for Patients: EntrepreneurPulse.com.au  Fact Sheet for Healthcare Providers: IncredibleEmployment.be  This test is not yet approved or cleared by the [place]  FDA and has been authorized for detection and/or diagnosis of SARS-CoV-2 by FDA under an Emergency Use Authorization (EUA). This EUA will remain in effect (meaning this test can be used) for the duration of the COVID-19 declaration under Section 564(b)(1) of the Act, 21 U.S.C. section 360bbb-3(b)(1), unless the authorization is terminated or revoked.  Performed at [facility], [place] [address], [place], [place] [address]   . SARS Coronavirus 2 Ag 05/15/2020 NEGATIVE  NEGATIVE Final   Comment: (NOTE) SARS-CoV-2 antigen NOT DETECTED.   Negative results are presumptive.  Negative results do not preclude SARS-CoV-2 infection and should not be used as the sole basis for treatment or other patient management decisions, including infection  control decisions, particularly in the presence of clinical signs and  symptoms consistent with COVID-19, or in those who have been in contact with the virus.  Negative results must be combined with clinical observations, patient history, and epidemiological information. The expected result is Negative.  Fact Sheet for Patients: HandmadeRecipes.com.cy  Fact Sheet for Healthcare Providers: FuneralLife.at  This test  is not yet approved or cleared by the [place] FDA and  has been authorized for detection and/or diagnosis of SARS-CoV-2 by FDA under an Emergency Use Authorization (EUA).  This EUA will remain in effect (meaning this test can be used) for the duration of  the COV                          ID-19 declaration under Section 564(b)(1) of the Act, 21 U.S.C. section 360bbb-3(b)(1), unless the authorization is terminated or revoked sooner.    Admission on 05/13/2020, Discharged on 05/13/2020  Component Date Value Ref Range Status  . WBC 05/13/2020 5.2  4.0 - 10.5 K/uL Final  . RBC 05/13/2020 4.67  3.87 - 5.11 MIL/uL Final  . Hemoglobin 05/13/2020 12.4  12.0 - 15.0 g/dL Final  . HCT 05/13/2020 39.9  36.0 - 46.0 % Final  . MCV 05/13/2020 85.4  80.0 - 100.0 fL Final  . MCH 05/13/2020 26.6  26.0 - 34.0 pg Final  . MCHC 05/13/2020 31.1  30.0 - 36.0 g/dL Final  . RDW 05/13/2020 14.1  11.5 - 15.5 % Final  . Platelets 05/13/2020 219  150 - 400 K/uL Final  . nRBC 05/13/2020 0.0  0.0 - 0.2 % Final  . Neutrophils Relative % 05/13/2020 38  % Final  . Neutro Abs 05/13/2020 2.0  1.7 - 7.7 K/uL Final  . Lymphocytes Relative 05/13/2020 48  % Final  . Lymphs Abs 05/13/2020 2.6  0.7 - 4.0 K/uL Final  . Monocytes Relative 05/13/2020 9  % Final  . Monocytes Absolute 05/13/2020 0.5  0.1 - 1.0 K/uL Final  . Eosinophils Relative 05/13/2020 4  % Final  . Eosinophils Absolute 05/13/2020 0.2  0.0 - 0.5 K/uL Final  . Basophils Relative 05/13/2020 1  % Final  . Basophils Absolute 05/13/2020 0.0  0.0 - 0.1 K/uL Final  . Immature Granulocytes 05/13/2020 0  % Final  . Abs Immature Granulocytes 05/13/2020 0.01  0.00 - 0.07 K/uL Final   Performed at [facility], [place] [address], [place], [place] [address]  . Sodium 05/13/2020 138  135 - 145 mmol/L Final  . Potassium 05/13/2020 3.8  3.5 - 5.1 mmol/L Final  . Chloride 05/13/2020 110  98 - 111 mmol/L Final  . CO2 05/13/2020 16* 22 - 32 mmol/L Final   . Glucose, Bld 05/13/2020 81  70 -  99 mg/dL Final  . BUN 05/13/2020 14  6 - 20 mg/dL Final  . Creatinine, Ser 05/13/2020 QUANTITY NOT SUFFICIENT, UNABLE TO PERFORM TEST  0.44 - 1.00 mg/dL Final   NOTIFIED AMANDA  . Calcium 05/13/2020 8.8* 8.9 - 10.3 mg/dL Final  . Total Protein 05/13/2020 7.3  6.5 - 8.1 g/dL Final  . Albumin 05/13/2020 3.7  3.5 - 5.0 g/dL Final  . AST 05/13/2020 20  15 - 41 U/L Final  . ALT 05/13/2020 13  0 - 44 U/L Final  . Alkaline Phosphatase 05/13/2020 50  38 - 126 U/L Final  . Total Bilirubin 05/13/2020 0.6  0.3 - 1.2 mg/dL Final  . GFR, Estimated 05/13/2020 NOT CALCULATED  >60 mL/min Final   Comment: (NOTE) Calculated using the CKD-EPI Creatinine Equation (2021)   . Anion gap 05/13/2020 12  5 - 15 Final   Performed at [facility], [place] [address] [place], [place] [address]  . Alcohol, Ethyl (B) 05/13/2020 <10  <10 mg/dL Final   Comment: (NOTE) Lowest detectable limit for serum alcohol is 10 mg/dL.  For medical purposes only. Performed at [facility], [place] [address], [place], [place] [address]   . Salicylate Lvl 11/94/1740 <7.0* 7.0 - 30.0 mg/dL Final   Performed at [place] [address] [place], [place] [address]  . Acetaminophen (Tylenol), Serum 05/13/2020 <10* 10 - 30 ug/mL Final   Comment: (NOTE) Therapeutic concentrations vary significantly. A range of 10-30 ug/mL  may be an effective concentration for many patients. However, some  are best treated at concentrations outside of this range. Acetaminophen concentrations >150 ug/mL at 4 hours after ingestion  and >50 ug/mL at 12 hours after ingestion are often associated with  toxic reactions.  Performed at [facility], [place] [address], [place], [place] [address]   . Opiates 05/13/2020 NONE DETECTED  NONE DETECTED Final  . Cocaine 05/13/2020 NONE DETECTED  NONE DETECTED Final  . Benzodiazepines 05/13/2020 POSITIVE*  NONE DETECTED Final  . Amphetamines 05/13/2020 NONE DETECTED  NONE DETECTED Final  . Tetrahydrocannabinol 05/13/2020 NONE DETECTED  NONE DETECTED Final  . Barbiturates 05/13/2020 NONE DETECTED  NONE DETECTED Final   Comment: (NOTE) DRUG SCREEN FOR MEDICAL PURPOSES ONLY.  IF CONFIRMATION IS NEEDED FOR ANY PURPOSE, NOTIFY LAB WITHIN 5 DAYS.  LOWEST DETECTABLE LIMITS FOR URINE DRUG SCREEN Drug Class                     Cutoff (ng/mL) Amphetamine and metabolites    1000 Barbiturate and metabolites    200 Benzodiazepine                 149 Tricyclics and metabolites     300 Opiates and metabolites        300 Cocaine and metabolites        300 THC                            50 Performed at [facility], [place] [address] [place], [place] [address]   . hCG, Beta Chain, Quant, S 05/13/2020 <1  <5 mIU/mL Final   Comment:          GEST. AGE      CONC.  (mIU/mL)   <=1 WEEK        5 - 50     2 WEEKS       50 -  500     3 WEEKS       100 - 10,000     4 WEEKS     1,000 - 30,000     5 WEEKS     3,500 - 115,000   6-8 WEEKS     12,000 - 270,000    12 WEEKS     15,000 - 220,000        FEMALE AND NON-PREGNANT FEMALE:     LESS THAN 5 mIU/mL Performed at [facility], [place] [address] [place], [place] [address]   Admission on 05/11/2020, Discharged on 05/12/2020  Component Date Value Ref Range Status  . Sodium 05/11/2020 140  135 - 145 mmol/L Final  . Potassium 05/11/2020 3.7  3.5 - 5.1 mmol/L Final  . Chloride 05/11/2020 113* 98 - 111 mmol/L Final  . CO2 05/11/2020 20* 22 - 32 mmol/L Final  . Glucose, Bld 05/11/2020 84  70 - 99 mg/dL Final   Glucose reference range applies only to samples taken after fasting for at least 8 hours.  . BUN 05/11/2020 14  6 - 20 mg/dL Final  . Creatinine, Ser 05/11/2020 0.86  0.44 - 1.00 mg/dL Final  . Calcium 05/11/2020 9.4  8.9 - 10.3 mg/dL Final  . Total Protein 05/11/2020 8.1  6.5 - 8.1 g/dL Final  . Albumin 05/11/2020  4.2  3.5 - 5.0 g/dL Final  . AST 05/11/2020 17  15 - 41 U/L Final  . ALT 05/11/2020 15  0 - 44 U/L Final  . Alkaline Phosphatase 05/11/2020 60  38 - 126 U/L Final  . Total Bilirubin 05/11/2020 0.4  0.3 - 1.2 mg/dL Final  . GFR, Estimated 05/11/2020 >60  >60 mL/min Final   Comment: (NOTE) Calculated using the CKD-EPI Creatinine Equation (2021)   . Anion gap 05/11/2020 7  5 - 15 Final   Performed at [facility], [place] [address], [place], [place] [address]  . Alcohol, Ethyl (B) 05/11/2020 <10  <10 mg/dL Final   Comment: (NOTE) Lowest detectable limit for serum alcohol is 10 mg/dL.  For medical purposes only. Performed at [facility], [place] [address], [place], [place] [address]   . Acetaminophen (Tylenol), Serum 05/11/2020 <10* 10 - 30 ug/mL Final   Comment: (NOTE) Therapeutic concentrations vary significantly. A range of 10-30 ug/mL  may be an effective concentration for many patients. However, some  are best treated at concentrations outside of this range. Acetaminophen concentrations >150 ug/mL at 4 hours after ingestion  and >50 ug/mL at 12 hours after ingestion are often associated with  toxic reactions.  Performed at [facility], [place] [address], [place], [place] [address]   . Salicylate Lvl 20/23/3435 <7.0* 7.0 - 30.0 mg/dL Final   Performed at [place] [address] [place], [place] [address]  . I-stat hCG, quantitative 05/11/2020 <5.0  <5 mIU/mL Final  . Comment 3 05/11/2020          Final   Comment:   GEST. AGE      CONC.  (mIU/mL)   <=1 WEEK        5 - 50     2 WEEKS       50 - 500     3 WEEKS       100 - 10,000     4 WEEKS     1,000 - 30,000        FEMALE AND NON-PREGNANT FEMALE:  LESS THAN 5 mIU/mL   . WBC 05/11/2020 4.7  4.0 - 10.5 K/uL Final  . RBC 05/11/2020 4.88  3.87 - 5.11 MIL/uL Final  . Hemoglobin 05/11/2020 12.7  12.0 - 15.0 g/dL Final  . HCT 05/11/2020 40.4  36.0 -  46.0 % Final  . MCV 05/11/2020 82.8  80.0 - 100.0 fL Final  . MCH 05/11/2020 26.0  26.0 - 34.0 pg Final  . MCHC 05/11/2020 31.4  30.0 - 36.0 g/dL Final  . RDW 05/11/2020 14.4  11.5 - 15.5 % Final  . Platelets 05/11/2020 232  150 - 400 K/uL Final  . nRBC 05/11/2020 0.0  0.0 - 0.2 % Final  . Neutrophils Relative % 05/11/2020 47  % Final  . Neutro Abs 05/11/2020 2.2  1.7 - 7.7 K/uL Final  . Lymphocytes Relative 05/11/2020 42  % Final  . Lymphs Abs 05/11/2020 2.0  0.7 - 4.0 K/uL Final  . Monocytes Relative 05/11/2020 8  % Final  . Monocytes Absolute 05/11/2020 0.4  0.1 - 1.0 K/uL Final  . Eosinophils Relative 05/11/2020 3  % Final  . Eosinophils Absolute 05/11/2020 0.2  0.0 - 0.5 K/uL Final  . Basophils Relative 05/11/2020 0  % Final  . Basophils Absolute 05/11/2020 0.0  0.0 - 0.1 K/uL Final  . Immature Granulocytes 05/11/2020 0  % Final  . Abs Immature Granulocytes 05/11/2020 0.01  0.00 - 0.07 K/uL Final   Performed at [facility], [place] [address], [place], [place] [address]  . SARS Coronavirus 2 by RT PCR 05/11/2020 NEGATIVE  NEGATIVE Final   Comment: (NOTE) SARS-CoV-2 target nucleic acids are NOT DETECTED.  The SARS-CoV-2 RNA is generally detectable in upper respiratory specimens during the acute phase of infection. The lowest concentration of SARS-CoV-2 viral copies this assay can detect is 138 copies/mL. A negative result does not preclude SARS-Cov-2 infection and should not be used as the sole basis for treatment or other patient management decisions. A negative result may occur with  improper specimen collection/handling, submission of specimen other than nasopharyngeal swab, presence of viral mutation(s) within the areas targeted by this assay, and inadequate number of viral copies(<138 copies/mL). A negative result must be combined with clinical observations, patient history, and epidemiological information. The expected result is Negative.  Fact Sheet for  Patients:  EntrepreneurPulse.com.au  Fact Sheet for Healthcare Providers:  IncredibleEmployment.be  This test is no                          t yet approved or cleared by the [place] FDA and  has been authorized for detection and/or diagnosis of SARS-CoV-2 by FDA under an Emergency Use Authorization (EUA). This EUA will remain  in effect (meaning this test can be used) for the duration of the COVID-19 declaration under Section 564(b)(1) of the Act, 21 U.S.C.section 360bbb-3(b)(1), unless the authorization is terminated  or revoked sooner.      . Influenza A by PCR 05/11/2020 NEGATIVE  NEGATIVE Final  . Influenza B by PCR 05/11/2020 NEGATIVE  NEGATIVE Final   Comment: (NOTE) The Xpert Xpress SARS-CoV-2/FLU/RSV plus assay is intended as an aid in the diagnosis of influenza from Nasopharyngeal swab specimens and should not be used as a sole basis for treatment. Nasal washings and aspirates are unacceptable for Xpert Xpress SARS-CoV-2/FLU/RSV testing.  Fact Sheet for Patients: EntrepreneurPulse.com.au  Fact Sheet for Healthcare Providers: IncredibleEmployment.be  This test is not yet approved or cleared by  the [employer] and has been authorized for detection and/or diagnosis of SARS-CoV-2 by FDA under an Emergency Use Authorization (EUA). This EUA will remain in effect (meaning this test can be used) for the duration of the COVID-19 declaration under Section 564(b)(1) of the Act, 21 U.S.C. section 360bbb-3(b)(1), unless the authorization is terminated or revoked.  Performed at [facility], [place] [address] [place], [place] [address]   Office Visit on 03/07/2020  Component Date Value Ref Range Status  . WBC 03/07/2020 3.3* 3.4 - 10.8 x10E3/uL Final  . RBC 03/07/2020 5.02  3.77 - 5.28 x10E6/uL Final  . Hemoglobin 03/07/2020 13.2  11.1 - 15.9 g/dL Final  . Hematocrit  03/07/2020 40.7  34.0 - 46.6 % Final  . MCV 03/07/2020 81  79 - 97 fL Final  . MCH 03/07/2020 26.3* 26.6 - 33.0 pg Final  . MCHC 03/07/2020 32.4  31.5 - 35.7 g/dL Final  . RDW 03/07/2020 13.9  11.7 - 15.4 % Final  . Platelets 03/07/2020 264  150 - 450 x10E3/uL Final  . Neutrophils 03/07/2020 31  Not Estab. % Final  . Lymphs 03/07/2020 45  Not Estab. % Final  . Monocytes 03/07/2020 18  Not Estab. % Final  . Eos 03/07/2020 5  Not Estab. % Final  . Basos 03/07/2020 1  Not Estab. % Final  . Neutrophils Absolute 03/07/2020 1.0* 1.4 - 7.0 x10E3/uL Final  . Lymphocytes Absolute 03/07/2020 1.5  0.7 - 3.1 x10E3/uL Final  . Monocytes Absolute 03/07/2020 0.6  0.1 - 0.9 x10E3/uL Final  . EOS (ABSOLUTE) 03/07/2020 0.2  0.0 - 0.4 x10E3/uL Final  . Basophils Absolute 03/07/2020 0.0  0.0 - 0.2 x10E3/uL Final  . Immature Granulocytes 03/07/2020 0  Not Estab. % Final  . Immature Grans (Abs) 03/07/2020 0.0  0.0 - 0.1 x10E3/uL Final  . NRBC 03/07/2020 1* 0 - 0 % Final  . Hematology Comments: 03/07/2020 Note:   Final   Verified by microscopic examination.  . Glucose 03/07/2020 82  65 - 99 mg/dL Final  . BUN 03/07/2020 9  6 - 20 mg/dL Final  . Creatinine, Ser 03/07/2020 0.89  0.57 - 1.00 mg/dL Final  . GFR calc non Af Amer 03/07/2020 88  >59 mL/min/1.73 Final  . GFR calc Af Amer 03/07/2020 102  >59 mL/min/1.73 Final   Comment: **In accordance with recommendations from the NKF-ASN Task force,**   [facility] is in the process of updating its eGFR calculation to the   2021 CKD-EPI creatinine equation that estimates kidney function   without a race variable.   . BUN/Creatinine Ratio 03/07/2020 10  9 - 23 Final  . Sodium 03/07/2020 141  134 - 144 mmol/L Final  . Potassium 03/07/2020 4.5  3.5 - 5.2 mmol/L Final  . Chloride 03/07/2020 110* 96 - 106 mmol/L Final  . CO2 03/07/2020 19* 20 - 29 mmol/L

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
## 2020-05-15 ENCOUNTER — Telehealth: Payer: Self-pay | Admitting: Pediatric Intensive Care

## 2020-05-15 ENCOUNTER — Ambulatory Visit: Payer: Medicaid Other | Admitting: Family Medicine

## 2020-05-15 LAB — RESP PANEL BY RT-PCR (FLU A&B, COVID) ARPGX2
Influenza A by PCR: NEGATIVE
Influenza B by PCR: NEGATIVE
SARS Coronavirus 2 by RT PCR: NEGATIVE

## 2020-05-15 LAB — POC SARS CORONAVIRUS 2 AG: SARS Coronavirus 2 Ag: NEGATIVE

## 2020-05-15 MED ORDER — ZIPRASIDONE MESYLATE 20 MG IM SOLR
INTRAMUSCULAR | Status: AC
  Administered 2020-05-15: 20 mg
  Filled 2020-05-15: qty 20

## 2020-05-15 MED ORDER — LORAZEPAM 1 MG PO TABS: 1.0000 mg | ORAL_TABLET | ORAL | Status: DC | PRN

## 2020-05-15 MED ORDER — OLANZAPINE 5 MG PO TBDP
5.0000 mg | ORAL_TABLET | Freq: Three times a day (TID) | ORAL | Status: DC | PRN
  Administered 2020-05-16: 5 mg via ORAL
  Filled 2020-05-15: qty 1

## 2020-05-15 MED ORDER — ZIPRASIDONE MESYLATE 20 MG IM SOLR
20.0000 mg | INTRAMUSCULAR | Status: AC | PRN
  Administered 2020-05-16: 20 mg via INTRAMUSCULAR
  Filled 2020-05-15: qty 20

## 2020-05-15 NOTE — ED Notes (Signed)
Pt received am medication without difficulty. Observed talking to self while laying down. Pt refuse to let RN draw labs or provide urine. Pt waving her hand stating, "no". Safety maintained.

## 2020-05-15 NOTE — ED Notes (Signed)
Pt awake & resting at present, no distress noted, calm & cooperative.  Monitoring for safety. 

## 2020-05-15 NOTE — ED Provider Notes (Signed)
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  8.7 - 10.2 mg/dL Final  . Total Protein 01/17/2020 7.6  6.0 - 8.5 g/dL Final  . Albumin 01/17/2020 4.2  3.9 - 5.0 g/dL Final  . Globulin, Total 01/17/2020 3.4  1.5 - 4.5 g/dL Final  . Albumin/Globulin Ratio 01/17/2020 1.2  1.2 - 2.2 Final  . Bilirubin Total 01/17/2020 <0.2  0.0 - 1.2  mg/dL Final  . Alkaline Phosphatase 01/17/2020 67  44 - 121 IU/L Final                 **Please note reference interval change**  . AST 01/17/2020 13  0 - 40 IU/L Final  . ALT 01/17/2020 15  0 - 32 IU/L Final  . Levetiracetam Lvl 01/17/2020 11.6  10.0 - 40.0 ug/mL Final   Comment: This test was developed and its performance characteristics determined by [facility]. It has not been cleared or approved by the Food and Drug Administration.     Blood Alcohol level:  Lab Results  Component Value Date   ETH <10 05/13/2020   ETH <10 02/54/2706    Metabolic Disorder Labs: Lab Results  Component Value Date   HGBA1C 4.9 07/11/2019   MPG 105.41 06/30/2019   No results found for: PROLACTIN Lab Results  Component Value Date   CHOL 129 06/30/2019   TRIG 55 06/30/2019   HDL 38 (L) 06/30/2019   CHOLHDL 3.4 06/30/2019   VLDL 11 06/30/2019   [place] 80 06/30/2019    Therapeutic Lab Levels: No results found for: LITHIUM No results found for: VALPROATE No components found for:  CBMZ  Physical Findings   AIMS   Flowsheet Row Admission (Discharged) from 07/13/2019 in [place] Admission (Discharged) from 06/29/2019 in [place] [facility]  AIMS Total Score 0 0    AUDIT   Flowsheet Row Admission (Discharged) from 07/13/2019 in [place] Admission (Discharged) from 06/29/2019 in [place] [facility]  Alcohol Use Disorder Identification Test Final Score (AUDIT) 0 0    PHQ2-9   [place] Office Visit from 10/20/2019 in [place] ED from 09/18/2019 in [place] Office Visit from 07/11/2019 in [place] Office Visit from 08/04/2017 in [place] Office Visit from 07/14/2017 in [place]  PHQ-2 Total Score 3 4 0 0 0  PHQ-9 Total Score 11 13 -- -- --    Flowsheet Row ED  from 05/14/2020 in [facility] ED from 05/13/2020 in [facility] [facility] ED from 05/11/2020 in [place] [facility]  C-SSRS RISK CATEGORY High Risk High Risk High Risk       Musculoskeletal  Strength & Muscle Tone: within normal limits Gait & Station: normal Patient leans: N/A  Psychiatric Specialty Exam  Presentation  General Appearance: Casual  Eye Contact:Fair  Speech:Normal Rate  Speech Volume:Normal  Handedness:Right   Mood and Affect  Mood:Anxious; Dysphoric  Affect:Constricted   Thought Process  Thought Processes:Coherent  Descriptions of Associations:Tangential  Orientation:Partial  Thought Content:Rumination  Diagnosis of Schizophrenia or Schizoaffective disorder in past: No  Duration of Psychotic Symptoms: Less than six months   Hallucinations:Hallucinations: Auditory Description of Auditory Hallucinations: Hears voices but cannot explain  Ideas of Reference:None  Suicidal Thoughts:Suicidal Thoughts: No  SI Active Intent and/or Plan: With Intent; With Plan; With Means to [place]  Homicidal Thoughts:Homicidal Thoughts: No   Sensorium  Memory:Immediate Fair; Recent Fair; Remote Fair  Judgment:Poor  Insight:Lacking   Executive Functions  Concentration:Fair  Attention Span:Fair  [place]  Language:Good   Psychomotor Activity  Psychomotor Activity:Psychomotor Activity: Normal   Assets  Assets:Desire for Improvement; Financial Resources/Insurance; Housing; Physical Health   Sleep  Sleep:Sleep: Good   Nutritional Assessment (For OBS and FBC admissions only) Has the patient had a weight loss or gain of 10 pounds or more in the last 3 months?: No Has the patient had a decrease in food intake/or appetite?: No Does the patient have dental problems?: No Does the patient have eating habits or behaviors that may be indicators of an  eating disorder including binging or inducing vomiting?: No Has the patient recently lost weight without trying?: No Has the patient been eating poorly because of a decreased appetite?: No Malnutrition Screening Tool Score: 0    Physical Exam  Physical Exam Vitals and nursing note reviewed.  Constitutional:      General: She is not in acute distress.    Appearance: Normal appearance. She is not ill-appearing, toxic-appearing or diaphoretic.  HENT:     Head: Normocephalic and atraumatic.  Pulmonary:     Effort: Pulmonary effort is normal.  Neurological:     General: No focal deficit present.     Mental Status: She is alert.    Review of Systems  Constitutional: Negative.   HENT: Negative.   Eyes: Negative.   Respiratory: Negative.   Cardiovascular: Negative.   Gastrointestinal: Positive for nausea. Negative for abdominal pain, blood in stool, constipation, diarrhea, heartburn and vomiting.  Genitourinary: Negative.   Musculoskeletal: Negative.   Skin: Negative.   Neurological: Positive for seizures. Negative for dizziness, tingling, tremors, sensory change, speech change, focal weakness, loss of consciousness, weakness and headaches.  Psychiatric/Behavioral: Positive for depression and hallucinations. Negative for memory loss, substance abuse and suicidal ideas. The patient is not nervous/anxious and does not have insomnia.    Blood pressure 109/71, pulse 93, temperature 97.7 F (36.5 C), temperature source Tympanic, resp. rate 16, SpO2 100 %. There is no height or weight on file to calculate BMI.  Treatment Plan Summary:   Due to Pt's high risk of Suicide, recent Suicidal attempt and IVC status. Pt is recommended for Inpatient hospitalization. Plan- Daily contact with patient to assess and evaluate symptoms and progress in treatment -Pt is awaiting Mood disorder bed in the Inpatient unit.  -Continue Keppra 1500 mg BID for seizure disorder -Risperidone 0.5 mg PRN for  Agitation. -Monitor For SI/HI/AVH.  Armando Reichert, MD 05/15/2020 2:11 PM

## 2020-05-15 NOTE — ED Notes (Signed)
Pt sitting in chair on observation unit. Pt looking around the room and talking. Will notify nurse of pt behavior.

## 2020-05-15 NOTE — Progress Notes (Signed)
Pt was agitated earlier and was standing at the exit door and will not move. PRN Risperdal was offered via interpreter but she refused. Pt then threatened to take off her clothes if she is not discharged. Pt was not able to redirect. Pt proceeded to lift up her dress and lay on the floor. Staff covered her with towels and blankets but she pulled them off. Provider was notified. Geodon was ordered and it was administered without resistance from pt. Pt was then assisted back to her bed. Pt is presently sitting quietly on her bed. Nourishment given. Staff will monitor for pt's safety.

## 2020-05-15 NOTE — ED Notes (Signed)
MEAL GIVEN 

## 2020-05-15 NOTE — Telephone Encounter (Signed)
Call to client's legal guardian Brianna Bibbs. Unable to leave VM as VM is not set up. [name] RN BSN CNP [phone]
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
## 2020-05-15 NOTE — ED Notes (Signed)
Pt sleeping in no acute distress. RR even and unlabored. Safety maintained. 

## 2020-05-15 NOTE — ED Notes (Signed)
Patient refusing labs and to give urine. Provider Nira Conn aware.

## 2020-05-15 NOTE — ED Notes (Signed)
Patient resting in bed with eyes closed. Respirations even and non labored. Monitoring continues. ?

## 2020-05-15 NOTE — Telephone Encounter (Signed)
Call form Patricia [name] with [facility]. Client is currently in ED under IVC. She was to have an appointment at [facility] this morning. CN will contact client's PCP Patricia [name] to update. Patricia Medal RN BSN CNP [phone]

## 2020-05-15 NOTE — ED Notes (Signed)
Pt standing at nurses station. No acute distress noted. Safety maintained.

## 2020-05-16 ENCOUNTER — Telehealth: Payer: Self-pay | Admitting: Pediatric Intensive Care

## 2020-05-16 MED ORDER — RISPERIDONE 1 MG PO TBDP
1.0000 mg | ORAL_TABLET | Freq: Two times a day (BID) | ORAL | Status: DC
  Administered 2020-05-16: 1 mg via ORAL
  Filled 2020-05-16 (×3): qty 1

## 2020-05-16 NOTE — ED Notes (Signed)
Pt resting with eyes closed. RR even and unlabored. No acute distress noted. Safety maintained.

## 2020-05-16 NOTE — Telephone Encounter (Signed)
Left VM for Brianna Bibbs to call CN back as soon as possible. [name] RN BSN CNP [phone]

## 2020-05-16 NOTE — Progress Notes (Signed)
Second attempt request made for [facility] bed placement.

## 2020-05-16 NOTE — ED Notes (Signed)
Pt requested to take a shower, supplies given and pt refused to take items. Encouragement provided.

## 2020-05-16 NOTE — Telephone Encounter (Signed)
Call to [employer]' DSS guardian. She is aware that client is being held under IVC. She states that she has been searching for group home placement for client but was told that client must have a full psychological evaluation. She was under the impression that [facility] had one on file for client but they did not. She has reached out to [facility] but was told that there was a 3 month waiting list. Patricia [name] states that she has put many guard rails in place to increase medication compliance and to assist with the family. CN advises she will look into fast tracking psychological evaluation for client to assist with group home placement. [name] RN BSN CNP [phone]

## 2020-05-16 NOTE — Progress Notes (Signed)
Pt was referred out to the following facilities due to no available beds at [facility], per Dr. Lucianne Muss.     Service Provider Address Phone Fax  [facility]  [address] [place] [address] [phone]  [facility]  [address] [place], [place] [address] [phone]  [facility]  [address] [place] [address] [phone]  [facility]  [address] [place] [address] [phone]  [facility]  [address] [place] [address] [phone]  [facility]  [address] [place] [address] [phone]  [facility]  [address], [place] [address] [phone]  [facility]  [address], [place] [address] [phone]  [facility]  [address] [place]., [place] [address] [phone]  [facility]  [address] [place] [address] [phone]  [facility]  [address], [place] [address] [phone]  [facility]  [address] [place], [place] [address] [phone]  [facility]  [address], [place] [address] [phone]  [facility]  [address], [place] [address] [phone]  [facility]  [address] [place] [address] [phone]  [facility]  [address] [place], [place] [address] [phone]  [facility]  [address] [place] [address] [phone]  [facility]  [address] [place] [address] [phone]  [facility] [address] [place] [address] [phone]  [facility]  [address] [place], [place] [address] [phone]  [facility]  [address], [place] [address] [phone]  [facility]  [address] [place] [address] [phone]  [name]  [address] [place] [address] [phone]  [facility]  [address] [place] [address] [phone]  [facility]  [address] [place] [address] [phone]   Situation ongoing CSW will follow.

## 2020-05-16 NOTE — ED Notes (Signed)
Pt sitting up on side of bed watching tv. Observed pt responding to internal stimuli and grin at staff but becoming agitated. Pt received am medication without difficulty. Zyprexa given for agitation without difficulty. Pt compliant. Used hand gestures to communicate with pt. Printed out questions in Swahili asking pt if she was hearing voices, feeling SI/HI. Pt nodded her head "no". Safety maintained. Safety maintained.
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
## 2020-05-16 NOTE — ED Notes (Signed)
Pt sleeping at present, sedated.  No distress noted, calm & cooperative.  Monitoring for safety.

## 2020-05-16 NOTE — Progress Notes (Addendum)
Due to aggressive behaviors of blocking the door, yelling, screaming  Patient received Geodon 20 mg IM . After medication administration, patient became more aggressive kicking staff.  Patient was placed in a manual hold at 1608, and then placed in the restraint chair at 1616. 1620 vital signs: blood pressure 108/87, Pulse of 60, and oxygen 100% respiration 16.  Patient was out of restraint chair at 1824 after becoming compliant and stopped with aggressive behaviors.

## 2020-05-16 NOTE — ED Notes (Signed)
Pt sleeping in no acute distress. RR even and unlabored. Safety maintained. 

## 2020-05-16 NOTE — ED Notes (Signed)
Patient was put in restraints. Patient is now comfortable on unit.

## 2020-05-16 NOTE — Progress Notes (Signed)
Per [facility] AC on duty- pt denied due to no appropriate beds available.

## 2020-05-16 NOTE — ED Provider Notes (Addendum)
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 40.0 ug/mL Final   Comment: This test was developed and its performance characteristics determined by [facility]. It has not been cleared or approved  by the Food and Drug Administration.   Office Visit on 01/17/2020  Component Date Value Ref Range Status  . WBC 01/17/2020 4.9  3.4 - 10.8 x10E3/uL Final  . RBC 01/17/2020 4.92  3.77 - 5.28 x10E6/uL Final  .  Hemoglobin 01/17/2020 13.0  11.1 - 15.9 g/dL Final  . Hematocrit 01/17/2020 39.8  34.0 - 46.6 % Final  . MCV 01/17/2020 81  79 - 97 fL Final  . MCH 01/17/2020 26.4* 26.6 - 33.0 pg Final  . MCHC 01/17/2020 32.7  31.5 - 35.7 g/dL Final  . RDW 01/17/2020 13.0  11.7 - 15.4 % Final  . Platelets 01/17/2020 242  150 - 450 x10E3/uL Final  . Neutrophils 01/17/2020 43  Not Estab. % Final  . Lymphs 01/17/2020 43  Not Estab. % Final  . Monocytes 01/17/2020 10  Not Estab. % Final  . Eos 01/17/2020 4  Not Estab. % Final  . Basos 01/17/2020 0  Not Estab. % Final  . Neutrophils Absolute 01/17/2020 2.1  1.4 - 7.0 x10E3/uL Final  . Lymphocytes Absolute 01/17/2020 2.1  0.7 - 3.1 x10E3/uL Final  . Monocytes Absolute 01/17/2020 0.5  0.1 - 0.9 x10E3/uL Final  . EOS (ABSOLUTE) 01/17/2020 0.2  0.0 - 0.4 x10E3/uL Final  . Basophils Absolute 01/17/2020 0.0  0.0 - 0.2 x10E3/uL Final  . Immature Granulocytes 01/17/2020 0  Not Estab. % Final  . Immature Grans (Abs) 01/17/2020 0.0  0.0 - 0.1 x10E3/uL Final  . Glucose 01/17/2020 69  65 - 99 mg/dL Final  . BUN 01/17/2020 11  6 - 20 mg/dL Final  . Creatinine, Ser 01/17/2020 0.83  0.57 - 1.00 mg/dL Final  . GFR calc non Af Amer 01/17/2020 96  >59 mL/min/1.73 Final  . GFR calc Af Amer 01/17/2020 111  >59 mL/min/1.73 Final   Comment: **In accordance with recommendations from the NKF-ASN Task force,**   [facility] is in the process of updating its eGFR calculation to the   2021 CKD-EPI creatinine equation that estimates kidney function   without a race variable.   . BUN/Creatinine Ratio 01/17/2020 13  9 - 23 Final  . Sodium 01/17/2020 139  134 - 144 mmol/L Final  . Potassium 01/17/2020 4.4  3.5 - 5.2 mmol/L Final  . Chloride 01/17/2020 106  96 - 106 mmol/L Final  . CO2 01/17/2020 21  20 - 29 mmol/L Final  . Calcium 01/17/2020 9.2  8.7 - 10.2 mg/dL Final  . Total Protein 01/17/2020 7.6  6.0 - 8.5 g/dL Final  . Albumin 01/17/2020 4.2  3.9 - 5.0 g/dL Final  . Globulin,  Total 01/17/2020 3.4  1.5 - 4.5 g/dL Final  . Albumin/Globulin Ratio 01/17/2020 1.2  1.2 - 2.2 Final  . Bilirubin Total 01/17/2020 <0.2  0.0 - 1.2 mg/dL Final  . Alkaline Phosphatase 01/17/2020 67  44 - 121 IU/L Final                 **Please note reference interval change**  . AST 01/17/2020 13  0 - 40 IU/L Final  . ALT 01/17/2020 15  0 - 32 IU/L Final  . Levetiracetam Lvl 01/17/2020 11.6  10.0 - 40.0 ug/mL Final   Comment: This test was developed and its performance characteristics determined by [facility]. It has not been cleared or approved by the Food and Drug Administration.     Blood Alcohol level:  Lab Results  Component Value Date   ETH <10 05/13/2020   ETH <10 05/11/2020  Metabolic Disorder Labs: Lab Results  Component Value Date   HGBA1C 4.9 07/11/2019   MPG 105.41 06/30/2019   No results found for: PROLACTIN Lab Results  Component Value Date   CHOL 129 06/30/2019   TRIG 55 06/30/2019   HDL 38 (L) 06/30/2019   CHOLHDL 3.4 06/30/2019   VLDL 11 06/30/2019   [place] 80 06/30/2019    Therapeutic Lab Levels: No results found for: LITHIUM No results found for: VALPROATE No components found for:  CBMZ  Physical Findings   AIMS   Flowsheet Row Admission (Discharged) from 07/13/2019 in [place] Admission (Discharged) from 06/29/2019 in [place] 500B  AIMS Total Score 0 0    AUDIT   Flowsheet Row Admission (Discharged) from 07/13/2019 in [place] Admission (Discharged) from 06/29/2019 in [place] [facility]  Alcohol Use Disorder Identification Test Final Score (AUDIT) 0 0    PHQ2-9   [place] Office Visit from 10/20/2019 in [place] ED from 09/18/2019 in [place] Office Visit from 07/11/2019 in [place] Office Visit from 08/04/2017 in [place]  Office Visit from 07/14/2017 in [place]  PHQ-2 Total Score 3 4 0 0 0  PHQ-9 Total Score 11 13 - - -    Flowsheet Row ED from 05/14/2020 in [facility] ED from 05/13/2020 in [place] DEPT ED from 05/11/2020 in [place] DEPT  C-SSRS RISK CATEGORY High Risk High Risk High Risk       Musculoskeletal  Strength & Muscle Tone: within normal limits Gait & Station: normal Patient leans: N/A  Psychiatric Specialty Exam  Presentation  General Appearance: Disheveled  Eye Contact:Fair  Speech:Normal Rate  Speech Volume:Normal  Handedness:Right   Mood and Affect  Mood:Anxious; Dysphoric  Affect:Constricted   Thought Process  Thought Processes:Coherent  Descriptions of Associations:Tangential  Orientation:Full (Time, Place and Person)  Thought Content:Rumination  Diagnosis of Schizophrenia or Schizoaffective disorder in past: No  Duration of Psychotic Symptoms: Less than six months   Hallucinations:Hallucinations: None Description of Auditory Hallucinations: Hears voices but cannot explain  Ideas of Reference:None  Suicidal Thoughts:Suicidal Thoughts: No  Homicidal Thoughts:Homicidal Thoughts: No   Sensorium  Memory:Immediate Fair; Recent Fair; Remote Fair  Judgment:Fair  Insight:Fair   Executive Functions  Concentration:Fair  Attention Span:Fair  [place]   Psychomotor Activity  Psychomotor Activity:Psychomotor Activity: Normal   Assets  Assets:Communication Skills; Financial Resources/Insurance; Housing; Physical Health   Sleep  Sleep:Sleep: Good   No data recorded  Physical Exam  Physical Exam Review of Systems  Constitutional: Negative.   HENT: Negative.   Eyes: Negative.   Cardiovascular: Negative.   Skin: Negative.   Neurological: Positive for seizures and headaches. Negative  for dizziness, tingling, tremors, sensory change, speech change, focal weakness, loss of consciousness and weakness.  Psychiatric/Behavioral: Negative for depression, hallucinations, memory loss, substance abuse and suicidal ideas. The patient is nervous/anxious. The patient does not have insomnia.    Blood pressure (!) 90/58, pulse 70, temperature (!) 97.5 F (36.4 C), temperature source Temporal, resp. rate 16, SpO2 100 %. There is no height or weight on file to calculate BMI.  Treatment Plan Summary:  Daily contact with patient to assess and evaluate symptoms and progress in treatment  Case discussed with Dr. Serafina Mitchell.  Due to patient's high  risk of suicide, past suicidal attempts and reported psychotic features by sister. We'll start Antipsychotics and  Patient will be observed overnight at [facility]. We'll reevaluate her tomorrow morning and decide appropriate level of care.   Plan- -Start Risperidone 1 mg twice daily for psychosis. -Continue Keppra 1500 mg BID for seizure disorder -Agitation protocol as ordered. -Monitor For SI/HI/AVH.   Armando Reichert, MD 05/16/2020 10:29 AM

## 2020-05-16 NOTE — ED Notes (Signed)
Pt quiet on unit. No acute distress noted. Safety maintained.

## 2020-05-16 NOTE — Progress Notes (Signed)
Request made to [facility] for bed placement. 

## 2020-05-16 NOTE — ED Notes (Signed)
Pt complained of being cold and wanted to take a hot shower. Staff assisted pt into shower, then gave pt an extra blanket. Pt appreciative.

## 2020-05-17 ENCOUNTER — Other Ambulatory Visit: Payer: Self-pay

## 2020-05-17 MED ORDER — ZIPRASIDONE MESYLATE 20 MG IM SOLR: 20.0000 mg | Freq: Once | INTRAMUSCULAR | Status: DC

## 2020-05-17 MED ORDER — ZIPRASIDONE MESYLATE 20 MG IM SOLR
INTRAMUSCULAR | Status: AC
  Administered 2020-05-17: 20 mg via INTRAMUSCULAR
  Filled 2020-05-17: qty 20

## 2020-05-17 MED ORDER — RISPERIDONE 1 MG PO TBDP: 1.0000 mg | ORAL_TABLET | Freq: Two times a day (BID) | ORAL | 0 refills | Status: DC

## 2020-05-17 NOTE — Progress Notes (Signed)
Patient has a legal Guardian Brianna Bibbs [phone]. Information updated in patient demographics.  Arman Bogus RN B[facility] PCCN  [facility] [phone]-cell [phone]-office

## 2020-05-17 NOTE — ED Notes (Signed)
Pt refused medicine after several attempts using translator. Pt also stated she wanted to go to [place] with her mom and didn't want to me left behind

## 2020-05-17 NOTE — BH Assessment (Signed)
[facility] Assessment Progress Note  This Clinical research associate agreed to review pt's chart to see if EPIC record contains psychometric testing estabilishing an IQ.  After so doing, I could not find such testing.  Pt is under the guardianship of [facility], and has a Personal assistant, Renard Matter [phone]).  I have reported findings to Karsten Ro, MD, Nelly Rout, MD, and Gwenevere Ghazi, LCSWA at [facility] with recommendation to reach out to these community supports to recommend that they arrange for pt to be tested at the earliest opportunity.  Doylene Canning, [place] [facility] Coordinator [phone]
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
## 2020-05-17 NOTE — Progress Notes (Signed)
CSW has made multiple attempts to reach DSS legal guardian Colin Mulders Bibbs without success. CSW left voicemail with callback request and contact information.   Signed:  Corky Crafts, MSW, [place], LCASA 05/17/2020 2:15 PM

## 2020-05-17 NOTE — ED Notes (Signed)
Pt sleeping at present, n distress noted, monitoring for safety.

## 2020-05-17 NOTE — ED Notes (Signed)
[employer] called for transport home. Translator used to interact with pt to let them know.

## 2020-05-17 NOTE — ED Notes (Signed)
Pt sleeping at present, no distress noted, monitoring for safety. 

## 2020-05-17 NOTE — ED Notes (Signed)
Pt starting to become increasingly agitated. PRN IM medicine administered per orders. Pt tolerated well

## 2020-05-17 NOTE — ED Notes (Signed)
Pt given 2 cups of diced peaches and nutri grain bar.

## 2020-05-17 NOTE — ED Notes (Signed)
Pt given 2 cups of diced peaches. 

## 2020-05-17 NOTE — ED Notes (Signed)
Pt given 2 cups of diced peaches.

## 2020-05-17 NOTE — ED Notes (Signed)
Resting with eyes closed. Rise and fall of chest noted. No new issues at this time. Will continue to monitor for safety 

## 2020-05-17 NOTE — ED Notes (Signed)
MHT offered numerous times food and drink. Pt refused all attempts

## 2020-05-17 NOTE — Discharge Instructions (Addendum)
Prescriptions given at discharge. Patient agreeable to plan.  Given opportunity to ask questions.  Appears to feel comfortable with discharge denies any current suicidal or homicidal thought. Patient is also instructed prior to discharge to: Take all medications as prescribed by her mental healthcare provider. Report any adverse effects and or reactions from the medicines to her outpatient provider promptly. Patient has been instructed & cautioned: To not engage in alcohol and or illegal drug use while on prescription medicines. In the event of worsening symptoms, patient is instructed to call the crisis hotline, 911 and or go to the nearest ED for appropriate evaluation and treatment of symptoms. To follow-up with her primary care provider for your other medical issues, concerns and or health care needs.    Please come to [facility] (this facility) during walk in hours for appointment with psychiatrist for further medication management and for therapy.   Walk in hours are 8-11 AM Monday through Thursday for medication management.It is first come, first -serve; it is best to arrive by 7:00 AM. On Friday from 1 pm to 4 pm for therapy intake only. Please arrive by 12:00 pm as it is  first come, first -serve.   When you arrive please go upstairs for your appointment. If you are unsure of where to go, inform the front desk that you are here for a walk in appointment and they will assist you with directions upstairs.  Address:  [address] in [place], [address] Ph: [phone]

## 2020-05-17 NOTE — Progress Notes (Signed)
CSW contacted [employer], [place] DSS guardian, in order to discuss discharge planning. Guardian was not reached, CSW left HIPAA compliant voicemail with callback request and contact information.   Signed:  Corky Crafts, MSW, [place], LCASA 05/17/2020 10:01 AM

## 2020-05-20 ENCOUNTER — Telehealth: Payer: Self-pay

## 2020-05-20 NOTE — Telephone Encounter (Signed)
This is a follow up telephone call after discharge from [facility] center.Patient had multiple visits to emergency room at [facility] as well. Patient was at home with family at the time of this call. I spoke with Ms Daw and encouraged her to take her medications. She stated " I want to run to the road be be hit by a car".Family  to call EMS if needed. Patient mother and brothers are watching her closely.I reminded family of appointment with [facility] on 05/22/20 at 11 am. Transportation will be provided. Patient brother will accompany patient to this appointment.  Arman Bogus RN BSn PCCN  [facility] [phone]-cell [phone]-office

## 2020-05-21 ENCOUNTER — Telehealth: Payer: Self-pay

## 2020-05-21 NOTE — Telephone Encounter (Signed)
Called patient family to remind them of appointment and to  schedule a ride for 05/22/20 to [facility] at 11am.  Nicole Cella Robinn Overholt RN BSn PCCN  [facility] [phone]-cell [phone]-office

## 2020-05-22 ENCOUNTER — Encounter: Payer: Self-pay | Admitting: Neurology

## 2020-05-22 ENCOUNTER — Telehealth: Payer: Self-pay

## 2020-05-22 ENCOUNTER — Ambulatory Visit: Payer: Medicaid Other | Admitting: Neurology

## 2020-05-22 ENCOUNTER — Other Ambulatory Visit: Payer: Self-pay

## 2020-05-22 ENCOUNTER — Other Ambulatory Visit: Payer: Self-pay | Admitting: Family Medicine

## 2020-05-22 VITALS — BP 96/61 | HR 69 | Ht 62.0 in | Wt 185.6 lb

## 2020-05-22 DIAGNOSIS — G40909 Epilepsy, unspecified, not intractable, without status epilepticus: Secondary | ICD-10-CM

## 2020-05-22 DIAGNOSIS — G808 Other cerebral palsy: Secondary | ICD-10-CM | POA: Diagnosis not present

## 2020-05-22 MED ORDER — LAMOTRIGINE 25 MG PO TABS: ORAL_TABLET | ORAL | 1 refills | Status: DC

## 2020-05-22 MED ORDER — RISPERIDONE 1 MG PO TBDP
1.0000 mg | ORAL_TABLET | Freq: Two times a day (BID) | ORAL | 1 refills | Status: DC
Start: 2020-05-22 — End: 2020-06-26

## 2020-05-22 NOTE — Telephone Encounter (Signed)
Transportation assistance provided by [employer] for a ride back home.  Arman Bogus RN BSn PCCN  [facility] [phone] [phone]-office

## 2020-05-22 NOTE — Telephone Encounter (Signed)
Reminded patient to go to this morning's appointment. Transportation arranged.  Arman Bogus RN BSn PCCN  [facility] [phone]-cell [phone]-office

## 2020-05-22 NOTE — Progress Notes (Signed)
Reason for visit: Seizures, behavior disorder  Patricia [name] is an 30 y.o. female  History of present illness:  Patricia [name] is a 30 year old left-handed black female with a history of seizures.  The patient has had poorly controlled seizures, in the month of February she had at least 4 seizures.  She has been on Keppra with an increase in the dose to 1500 mg twice daily.  Lamotrigine was recommended, but the patient apparently never got the prescription.  She will have episodes of behavior where she will try to run away from her home, she was seen in the emergency room on 14 May 2020 when she tried to run away, the police were able to find her and they got her to the emergency room.  Blood work was done at that time.  The patient has not apparently had any increase in anxiety or agitation with the increase in the Keppra level according to the family.  This behavior has been present for years.  Her typical seizures are associated with dystonic posturing and spinning and then a generalized seizure.  The patient comes to the office today for further evaluation.  Past Medical History:  Diagnosis Date  . Bipolar 1 disorder (HCC)   . Cerebral palsy (HCC)   . Hypertension   . Seizures (HCC)   . Suicidal behavior     No past surgical history on file.  Family History  Problem Relation Age of Onset  . Hepatitis Brother     Social history:  reports that she has never smoked. She has never used smokeless tobacco. She reports current alcohol use of about 2.0 standard drinks of alcohol per week. She reports that she does not use drugs.   No Known Allergies  Medications:  Prior to Admission medications   Medication Sig Start Date End Date Taking? Authorizing Provider  levETIRAcetam (KEPPRA) 500 MG tablet Take 3 tablets (1,500 mg total) by mouth 2 (two) times daily. 02/10/20  Yes Westley Chandler, MD  risperiDONE (RISPERDAL M-TABS) 1 MG disintegrating tablet Take 1 tablet (1 mg total) by  mouth 2 (two) times daily. Patient not taking: Reported on 05/22/2020 05/17/20   Karsten Ro, MD    ROS:  Out of a complete 14 system review of symptoms, the patient complains only of the following symptoms, and all other reviewed systems are negative.  Behavioral outburst Seizures  Blood pressure 96/61, pulse 69, height 5\' 2"  (1.575 m), weight 185 lb 9.6 oz (84.2 kg).  Physical Exam  General: The patient is alert and cooperative at the time of the examination.  Skin: No significant peripheral edema is noted.   Neurologic Exam  Mental status: The patient is alert and oriented x 3 at the time of the examination. The patient has apparent normal recent and remote memory, with an apparently normal attention span and concentration ability.   Cranial nerves: Facial symmetry is present. Speech is normal, no aphasia or dysarthria is noted. Extraocular movements are full. Visual fields are full.  Motor: The patient has good strength in all 4 extremities.  Sensory examination: Soft touch sensation is symmetric on the face, arms, and legs.  Coordination: The patient has good finger-nose-finger and heel-to-shin bilaterally.  The patient has some apraxia with the use of the lower extremities.  Gait and station: The patient has a normal gait. Tandem gait is normal. Romberg is negative. No drift is seen.  Reflexes: Deep tendon reflexes are symmetric.   Assessment/Plan:  1.  Cognitive impairment  2.  Seizures  The patient continues to have intractable seizures.  We will add the lamotrigine as this may act as a mood stabilizer and may help some of her behavior.  The patient will work up to 75 mg twice daily, they are to contact our office if she is tolerating this and we will switch her to the 100 mg tablets.  She will continue the Keppra at 1500 mg twice daily.  She will follow up here in 4 months.  Marlan Palau MD 05/22/2020 11:45 AM  [facility] [address] [place], [place] [address]  Phone [phone] Fax [phone]

## 2020-05-22 NOTE — Patient Instructions (Signed)
We will go on lamictal for the seizures. When you get to 75 mg twice a day for 2 weeks, call for a new prescription for the 100 mg tablets.  Lamictal (lamotrigine) is a seizure medication that occasionally may be used for other purposes such as peripheral neuropathy pain or certain types of headache. This medication is relatively safe, but occasionally side effects can occur. A skin rash may occur when first starting the medication. As with any seizure medication, depression may worsen on the drug. Other potential side effects include dizziness, headache, drowsiness or insomnia, decreased concentration, or stomach upset. This medication may also be used as a mood stabilizer. If you believe that you are having side effects on the medication, please contact our office.

## 2020-05-22 NOTE — Progress Notes (Signed)
Risperidone sent to preferred pharmacy.  [name], MD  Family Medicine Teaching Service

## 2020-05-24 ENCOUNTER — Telehealth: Payer: Self-pay

## 2020-05-24 NOTE — Telephone Encounter (Signed)
Patient brother called stating that new medication has been delivered and would like assistance reading and understating how and when to take it. I requested him to send me a photo with instructions. Noted Risperidol ODT 1mg  by mouth two times a day in blister packs for morning and evening (Per the photo sent). Interpreted same information into swahili and he verbalized understanding. Instructed to continue with other medication Keppra and Lamictal as previously prescribed.  Shonte Soderlund RN BSn PCCN  [facility] [phone]-cell [phone]-office   .

## 2020-05-28 ENCOUNTER — Telehealth: Payer: Self-pay

## 2020-05-28 NOTE — Telephone Encounter (Signed)
Contacted patient brother and mother. Patient is doing much better after starting Risperidone. Patient has not had any episodes of elopement or suicidal ideation/attempts since starting this medication per family. She is reported to be calmer and sleeping well at night and taking naps during the day.She is also taking Lamictal and Keppra per order.  I will continue to closely follow.  Arman Bogus RN BSn PCCN  [facility] [phone]-cell [phone]-office

## 2020-05-29 ENCOUNTER — Telehealth: Payer: Self-pay | Admitting: Pediatric Intensive Care

## 2020-05-29 NOTE — Telephone Encounter (Signed)
Call to [facility] to schedule appointment for client. Left HIPAA compliant message to return call. [name] Rn BSN CNP [phone]

## 2020-05-30 ENCOUNTER — Telehealth: Payer: Self-pay | Admitting: Pediatric Intensive Care

## 2020-05-30 NOTE — Telephone Encounter (Signed)
Call to [facility] to schedule hospital follow up appointment for client. Clinic staff advised CN that client should come to walk in hours for follow up. CN states that client would not be able to do that. Appointment made for 07/10/20 at 0800. [name] Muhoro RN at [facility] aware. [name] RN BSN CNP [phone]

## 2020-05-31 ENCOUNTER — Telehealth: Payer: Self-pay
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
## 2020-06-01 ENCOUNTER — Telehealth: Payer: Self-pay | Admitting: Family Medicine

## 2020-06-01 NOTE — Telephone Encounter (Signed)
Attempted to call guardian about patient's antiepileptic medications during her jail time.  According to the patient's intensive case manager Laurette the patient is in jail awaiting psychosocial evaluation in order to expedite placement outside the home, this is per the patient's guardian.  I reached out to the guardian multiple times in an attempt to ensure the patient receives her antiepileptics over the weekend while she was kept in jail for this. Guardian, Brianna, did not return call at DSS and has cellular device off.  [name], MD  Family Medicine Teaching Service

## 2020-06-02 ENCOUNTER — Telehealth: Payer: Self-pay
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
## 2020-06-04 ENCOUNTER — Telehealth: Payer: Self-pay | Admitting: Family Medicine

## 2020-06-04 NOTE — Telephone Encounter (Signed)
Communicated with guardian about importance of medications. Call [facility] at jail and left message to see if medications prescriptions can be faxed there.  Will work to ensure patient has medications.  [name], MD  Family Medicine Teaching Service

## 2020-06-05 ENCOUNTER — Telehealth: Payer: Self-pay | Admitting: Family Medicine

## 2020-06-05 NOTE — Telephone Encounter (Signed)
Called [facility]. Spoke with Effie Shy in medical. They requested list of current medications.  Please fax to Effie Shy [facility]  [phone]   Current Outpatient Medications:  .  lamoTRIgine (LAMICTAL) 25 MG tablet, One tablet twice a day for 2 weeks, then take 2 tablets twice a day for 2 weeks, then take 3 tablets twice a day, Disp: 180 tablet, Rfl: 1 .  levETIRAcetam (KEPPRA) 500 MG tablet, Take 3 tablets (1,500 mg total) by mouth 2 (two) times daily., Disp: 180 tablet, Rfl: 11 .  risperiDONE (RISPERDAL M-TABS) 1 MG disintegrating tablet, Take 1 tablet (1 mg total) by mouth 2 (two) times daily., Disp: 60 tablet, Rfl: 1  Please note the patient should be restarted on the lower dose of lamictal as this has been held (restart at 25 mg BID)    [name], MD  Family Medicine Teaching Service

## 2020-06-05 NOTE — Telephone Encounter (Signed)
This information has been faxed to the [facility].

## 2020-06-11 ENCOUNTER — Telehealth: Payer: Self-pay | Admitting: Family Medicine

## 2020-06-11 NOTE — Telephone Encounter (Signed)
Received call back from [place]. Discussed case, she will discuss with Colin Mulders and likely patient will be released tomorrow.  [name], MD  Family Medicine Teaching Service

## 2020-06-11 NOTE — Telephone Encounter (Signed)
Called guardian, no answer. Patient still in custody, awaiting trial in mental health court. No Psychological evaluation conducted. Left message for supervisor Lilia Argue.  [name], MD  Family Medicine Teaching Service

## 2020-06-12 ENCOUNTER — Telehealth: Payer: Self-pay | Admitting: Family Medicine

## 2020-06-12 NOTE — Telephone Encounter (Signed)
Received call from supervisors, Jill Side, that patient did not have evaluation completed and lawyer did not show up for court yesterday. They are escalating case.Misty Stanley Grigley-attorney, did not attend court hearing).   Awaiting call back from [facility] MD to confirm if walk in appointment for this can be completed today.

## 2020-06-12 NOTE — Progress Notes (Addendum)
CSW received call back from [facility] (Main Line: [phone]; Fax: [phone]) who report that they DO complete psychological testing and are currently scheduling 5 weeks out. And do have interpreter services under provider discretion.  Penni Homans, MSW, LCSW 06/12/2020 2:57 PM     At the request of NP Feliz Beam and Dr. Manson Passey, CSW looked into the following.  CSW did receive call that [facility] no longer completes psychological testing.   Penni Homans, MSW, LCSW 06/12/2020 1:25 PM    The following providers are able to complete psychological testing:  [facility] [phone] Waiting on call back  [facility] [phone] Only completing for children at this time.  [facility] [phone] Have to have a [facility] provider.  [facility] Number not in working order  [facility] [phone] Offers psychological testing but not language interpreter services.  [facility] Does not accept Medicaid.  CSW also looked into [facility] and the following were found: [facility] [phone]  [facility]  OFFICE Phone/Fax: [phone]  Per the website for both, they offer Swahili, calls were not returned at this time.   Penni Homans, MSW, LCSW 06/12/2020 12:02 PM

## 2020-06-13 ENCOUNTER — Telehealth: Payer: Self-pay

## 2020-06-13 NOTE — Telephone Encounter (Signed)
Monday April 18th 2022 Patient mother and 2 brothers called asking when their family member  will be reliesed from jail. Family states that they have not been unable to reach the legal guardian Brianna Bibbs. I called and spoke with [place] case worker who has been in touch with the legal guardian. Per legal guardian, phycological evaluation was still pending  and  patient will not be released from jail until its done.  Wednesday April 06/13/2020 Patient brother Faustino Congress called and stated that family has not seen patient since she was incarcerated and would like to a tleast speak with her on phone. I have emailed the legal guardian who stated that family can visit during visiting hours.   Arman Bogus RN BSn PCCN  [facility] [phone]-cell [phone]-office

## 2020-06-16 ENCOUNTER — Telehealth: Payer: Self-pay

## 2020-06-16 NOTE — Telephone Encounter (Signed)
I received e-mail from patient`s attorney stating that patient can be released from jail after paying $10 bond. Family informed and went to jail to bond her out.Unfortunately family was informed that  the jail  had not received official communication from the court and therefore patient could not be  released from jail.   Arman Bogus RN BSn PCCN  [facility] [phone]-cell [phone]-office

## 2020-06-20 ENCOUNTER — Telehealth: Payer: Self-pay

## 2020-06-20 NOTE — Telephone Encounter (Signed)
06/19/20 Called sister-in-law of patient to pick patient up from jail. Able to pay $10 bond and patient was released to home.   Arman Bogus RN BSn PCCN  [facility] [phone]-cell [phone]-office

## 2020-06-21 NOTE — Congregational Nurse Program (Signed)
  Dept: [phone]   [facility] Nurse Program Note  Date of Encounter: 06/21/2020  Past Medical History: Past Medical History:  Diagnosis Date  . Bipolar 1 disorder (HCC)   . Cerebral palsy (HCC)   . Hypertension   . Seizures (HCC)   . Suicidal behavior     Encounter Details:  Visit patient at home s/p release from jail. Patient is calm and says she is happy to be home. Unable to verify medication because patient brother who administers the medication is not at home. However, I spoke with him on phone.I have educated him on medication regimen per Dr Myrtie Neither instructions   and he verbalized  Understanding.  Arman Bogus RN BSn PCCN  [facility] [phone]-cell [phone]-office

## 2020-06-22 ENCOUNTER — Telehealth: Payer: Self-pay
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
## 2020-06-26 ENCOUNTER — Telehealth: Payer: Self-pay | Admitting: Family Medicine

## 2020-06-26 DIAGNOSIS — F0281 Dementia in other diseases classified elsewhere with behavioral disturbance: Secondary | ICD-10-CM

## 2020-06-26 DIAGNOSIS — F02B18 Dementia in other diseases classified elsewhere, moderate, with other behavioral disturbance: Secondary | ICD-10-CM

## 2020-06-26 DIAGNOSIS — F79 Unspecified intellectual disabilities: Secondary | ICD-10-CM

## 2020-06-26 DIAGNOSIS — F4325 Adjustment disorder with mixed disturbance of emotions and conduct: Secondary | ICD-10-CM

## 2020-06-26 DIAGNOSIS — G40909 Epilepsy, unspecified, not intractable, without status epilepticus: Secondary | ICD-10-CM

## 2020-06-26 MED ORDER — LAMOTRIGINE 25 MG PO TABS: ORAL_TABLET | ORAL | 0 refills | Status: DC

## 2020-06-26 MED ORDER — RISPERIDONE 1 MG PO TBDP: 1.0000 mg | ORAL_TABLET | Freq: Two times a day (BID) | ORAL | 3 refills | Status: DC

## 2020-06-26 NOTE — Telephone Encounter (Signed)
Medications sent to [facility] per family and RN request---sent specific taper of lamotrigine and risperidone (dose changed by psychiatry on 3/24, sent to different pharmacy). RN will help family and patient with taper and monitor for adverse events.   [name], MD  Family Medicine Teaching Service

## 2020-06-26 NOTE — Congregational Nurse Program (Signed)
  Dept: [phone]   Congregational Nurse Program Note  Date of Encounter:  06/25/20  Past Medical History: Past Medical History:  Diagnosis Date  . Bipolar 1 disorder (HCC)   . Cerebral palsy (HCC)   . Hypertension   . Seizures (HCC)   . Suicidal behavior     Encounter Details:  CNP Questionnaire - 06/26/20 1100      Questionnaire   Do you give verbal consent to treat you today? Yes (P)     Visit Setting Home (P)     Location Patient Served At [facility] (P)     Patient Status Refugee (P)     Medical Provider Yes (P)     Insurance Medicaid (P)     Intervention Advocate;Assess (including screenings);Educate;Refer;Support (P)     Housing/Utilities Worried about losing housing (P)     Transportation Need transportation assistance (P)     Interpersonal Safety Do not feel physically and emotionally safe where you currently live (P)     Food Within past 12 months, worried food would run out with no money to buy more (P)     Medication Have medication insecurities;Provided medication assistance (Pharmacies, drug rep, etc.) (P)     Referrals Medication Assistance (P)           Routine home visit completed on 06/25/20. Inspected medications and communicated with Dr Manson Passey for refills.  Arman Bogus RN BSn PCCN  [facility] [phone]-cell [phone]-office

## 2020-07-10 NOTE — Congregational Nurse Program (Signed)
  Dept: [phone]   Congregational Nurse Program Note  Date of Encounter: 07/10/2020  Past Medical History: Past Medical History:  Diagnosis Date  . Bipolar 1 disorder (HCC)   . Cerebral palsy (HCC)   . Hypertension   . Seizures (HCC)   . Suicidal behavior     Encounter Details:  Home visit completed on  07/10/2020 at  2 pm. Medication inspected. Recent delivered medications were unopened and noted that medications of 3 family members were all mixed up in one delivery bag.The patient and family unable to tell who's medication belong to which family member. Medication sorted out. Patient and family re educated on how to administer medication. Patient has been taking Lamictal 25 mg po advised to increase the dose to 50 mg po for the next 2 weeks per MD`s order.Patient is compliant with Keppra 1500 mg po twice daily.I have contacted pharmacy to refill Risperidone per family request. Patient and family reminded of tomorrow`s appointment.Transportation will be provided by [employer].  Nicole Cella Laryssa Hassing RN BSN PCCN [phone]-Office [phone]-Cell

## 2020-07-11 ENCOUNTER — Ambulatory Visit (HOSPITAL_COMMUNITY): Payer: Medicaid Other | Admitting: Physician Assistant

## 2020-07-12 ENCOUNTER — Other Ambulatory Visit: Payer: Self-pay

## 2020-07-12 ENCOUNTER — Encounter (HOSPITAL_COMMUNITY): Payer: Self-pay

## 2020-07-12 ENCOUNTER — Telehealth: Payer: Self-pay

## 2020-07-12 ENCOUNTER — Emergency Department (HOSPITAL_COMMUNITY)
Admission: EM | Admit: 2020-07-12 | Discharge: 2020-07-13 | Disposition: A | Discharge status: Other Acute Inpatient Hospital | Payer: Medicaid Other | Source: Home / Self Care

## 2020-07-12 DIAGNOSIS — F329 Major depressive disorder, single episode, unspecified: Secondary | ICD-10-CM | POA: Insufficient documentation

## 2020-07-12 DIAGNOSIS — I10 Essential (primary) hypertension: Secondary | ICD-10-CM | POA: Insufficient documentation

## 2020-07-12 DIAGNOSIS — Z20822 Contact with and (suspected) exposure to covid-19: Secondary | ICD-10-CM | POA: Insufficient documentation

## 2020-07-12 DIAGNOSIS — R4689 Other symptoms and signs involving appearance and behavior: Secondary | ICD-10-CM

## 2020-07-12 LAB — CBC WITH DIFFERENTIAL/PLATELET
Abs Immature Granulocytes: 0.01 10*3/uL (ref 0.00–0.07)
Basophils Absolute: 0 10*3/uL (ref 0.0–0.1)
Basophils Relative: 1 %
Eosinophils Absolute: 0.2 10*3/uL (ref 0.0–0.5)
Eosinophils Relative: 4 %
HCT: 35 % — ABNORMAL LOW (ref 36.0–46.0)
Hemoglobin: 11.1 g/dL — ABNORMAL LOW (ref 12.0–15.0)
Immature Granulocytes: 0 %
Lymphocytes Relative: 42 %
Lymphs Abs: 2.3 10*3/uL (ref 0.7–4.0)
MCH: 25.8 pg — ABNORMAL LOW (ref 26.0–34.0)
MCHC: 31.7 g/dL (ref 30.0–36.0)
MCV: 81.4 fL (ref 80.0–100.0)
Monocytes Absolute: 0.5 10*3/uL (ref 0.1–1.0)
Monocytes Relative: 10 %
Neutro Abs: 2.3 10*3/uL (ref 1.7–7.7)
Neutrophils Relative %: 43 %
Platelets: 212 10*3/uL (ref 150–400)
RBC: 4.3 MIL/uL (ref 3.87–5.11)
RDW: 14.4 % (ref 11.5–15.5)
WBC: 5.4 10*3/uL (ref 4.0–10.5)
nRBC: 0 % (ref 0.0–0.2)

## 2020-07-12 LAB — COMPREHENSIVE METABOLIC PANEL
ALT: 17 U/L (ref 0–44)
AST: 20 U/L (ref 15–41)
Albumin: 3.9 g/dL (ref 3.5–5.0)
Alkaline Phosphatase: 48 U/L (ref 38–126)
Anion gap: 7 (ref 5–15)
BUN: 23 mg/dL — ABNORMAL HIGH (ref 6–20)
CO2: 17 mmol/L — ABNORMAL LOW (ref 22–32)
Calcium: 8.8 mg/dL — ABNORMAL LOW (ref 8.9–10.3)
Chloride: 113 mmol/L — ABNORMAL HIGH (ref 98–111)
Creatinine, Ser: 0.92 mg/dL (ref 0.44–1.00)
GFR, Estimated: >60 mL/min (ref >60)
Glucose, Bld: 83 mg/dL (ref 70–99)
Potassium: 4.1 mmol/L (ref 3.5–5.1)
Sodium: 137 mmol/L (ref 135–145)
Total Bilirubin: 0.6 mg/dL (ref 0.3–1.2)
Total Protein: 7.7 g/dL (ref 6.5–8.1)

## 2020-07-12 LAB — HCG, SERUM, QUALITATIVE: Preg, Serum: NEGATIVE

## 2020-07-12 LAB — RESP PANEL BY RT-PCR (FLU A&B, COVID) ARPGX2
Influenza A by PCR: NEGATIVE
Influenza B by PCR: NEGATIVE
SARS Coronavirus 2 by RT PCR: NEGATIVE

## 2020-07-12 LAB — SALICYLATE LEVEL: Salicylate Lvl: <7 mg/dL — ABNORMAL LOW (ref 7.0–30.0)

## 2020-07-12 LAB — ACETAMINOPHEN LEVEL: Acetaminophen (Tylenol), Serum: <10 ug/mL — ABNORMAL LOW (ref 10–30)

## 2020-07-12 LAB — ETHANOL: Alcohol, Ethyl (B): <10 mg/dL (ref <10)

## 2020-07-12 MED ORDER — RISPERIDONE 1 MG PO TBDP
1.0000 mg | ORAL_TABLET | Freq: Two times a day (BID) | ORAL | Status: DC
  Administered 2020-07-12 – 2020-07-13 (×3): 1 mg via ORAL
  Filled 2020-07-12 (×3): qty 1

## 2020-07-12 MED ORDER — LAMOTRIGINE 25 MG PO TABS
50.0000 mg | ORAL_TABLET | Freq: Two times a day (BID) | ORAL | Status: DC
  Administered 2020-07-13: 50 mg via ORAL
  Filled 2020-07-12: qty 2

## 2020-07-12 MED ORDER — LEVETIRACETAM 500 MG PO TABS
1500.0000 mg | ORAL_TABLET | Freq: Two times a day (BID) | ORAL | Status: DC
  Administered 2020-07-12 – 2020-07-13 (×3): 1500 mg via ORAL
  Filled 2020-07-12 (×3): qty 3

## 2020-07-12 NOTE — ED Notes (Signed)
Pt was wanded by security. All belongings are located in one pt belongings bag in [place].
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
## 2020-07-12 NOTE — ED Notes (Signed)
Pt given blankets and chair adjusted to laying position. Lights dimmed as pt appears to want to sleep. Pt still cooperative and has no other needs at this time.

## 2020-07-12 NOTE — ED Notes (Signed)
TTS cart and Wall-E at bedside for TTS consult.

## 2020-07-12 NOTE — BH Assessment (Signed)
Disposition Liborio Nixon, NP, recommends overnight observation for safety and stabilization with psych reassessment in the AM. Reyne Dumas, RN, informed of disposition via secure chat.
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
## 2020-07-12 NOTE — ED Notes (Signed)
Pt has IVC papers petitioned by sister in law, Sheriff with patient. Dr. Clarene Duke did first exam on this patient.
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
## 2020-07-12 NOTE — ED Notes (Signed)
Pt refusing to answer suicide questions. Pt refusing tempeture

## 2020-07-12 NOTE — ED Notes (Signed)
TTS consult complete  

## 2020-07-12 NOTE — ED Notes (Signed)
Patient placed in scrubs

## 2020-07-12 NOTE — ED Notes (Signed)
TTS consult in process at this time with assistance of interpreter service.

## 2020-07-13 ENCOUNTER — Inpatient Hospital Stay (HOSPITAL_COMMUNITY)
Admission: AD | Admit: 2020-07-13 | Discharge: 2020-07-16 | DRG: 885 | Disposition: A | Discharge status: Home / Self Care | Payer: Medicaid Other | Source: Intra-hospital | Attending: Psychiatry | Admitting: Psychiatry

## 2020-07-13 ENCOUNTER — Encounter (HOSPITAL_COMMUNITY): Payer: Self-pay | Admitting: Psychiatry

## 2020-07-13 ENCOUNTER — Other Ambulatory Visit: Payer: Self-pay | Admitting: Psychiatry

## 2020-07-13 ENCOUNTER — Other Ambulatory Visit: Payer: Self-pay

## 2020-07-13 DIAGNOSIS — Z20822 Contact with and (suspected) exposure to covid-19: Secondary | ICD-10-CM | POA: Diagnosis present

## 2020-07-13 DIAGNOSIS — G809 Cerebral palsy, unspecified: Secondary | ICD-10-CM | POA: Diagnosis present

## 2020-07-13 DIAGNOSIS — G4700 Insomnia, unspecified: Secondary | ICD-10-CM | POA: Diagnosis present

## 2020-07-13 DIAGNOSIS — Z79899 Other long term (current) drug therapy: Secondary | ICD-10-CM | POA: Diagnosis not present

## 2020-07-13 DIAGNOSIS — T1491XA Suicide attempt, initial encounter: Secondary | ICD-10-CM | POA: Diagnosis not present

## 2020-07-13 DIAGNOSIS — G40909 Epilepsy, unspecified, not intractable, without status epilepticus: Secondary | ICD-10-CM | POA: Diagnosis present

## 2020-07-13 DIAGNOSIS — F319 Bipolar disorder, unspecified: Secondary | ICD-10-CM | POA: Diagnosis present

## 2020-07-13 DIAGNOSIS — F79 Unspecified intellectual disabilities: Secondary | ICD-10-CM | POA: Diagnosis present

## 2020-07-13 DIAGNOSIS — F609 Personality disorder, unspecified: Secondary | ICD-10-CM | POA: Diagnosis present

## 2020-07-13 DIAGNOSIS — F0281 Dementia in other diseases classified elsewhere with behavioral disturbance: Secondary | ICD-10-CM

## 2020-07-13 DIAGNOSIS — F333 Major depressive disorder, recurrent, severe with psychotic symptoms: Secondary | ICD-10-CM | POA: Diagnosis present

## 2020-07-13 DIAGNOSIS — F3160 Bipolar disorder, current episode mixed, unspecified: Principal | ICD-10-CM

## 2020-07-13 DIAGNOSIS — F4320 Adjustment disorder, unspecified: Secondary | ICD-10-CM | POA: Diagnosis present

## 2020-07-13 DIAGNOSIS — F419 Anxiety disorder, unspecified: Secondary | ICD-10-CM | POA: Diagnosis present

## 2020-07-13 DIAGNOSIS — I10 Essential (primary) hypertension: Secondary | ICD-10-CM | POA: Diagnosis present

## 2020-07-13 DIAGNOSIS — R45851 Suicidal ideations: Secondary | ICD-10-CM | POA: Diagnosis present

## 2020-07-13 DIAGNOSIS — F4325 Adjustment disorder with mixed disturbance of emotions and conduct: Secondary | ICD-10-CM

## 2020-07-13 DIAGNOSIS — F02B18 Dementia in other diseases classified elsewhere, moderate, with other behavioral disturbance: Secondary | ICD-10-CM

## 2020-07-13 LAB — LAMOTRIGINE LEVEL: Lamotrigine Lvl: <1 ug/mL — ABNORMAL LOW (ref 2.0–20.0)

## 2020-07-13 MED ORDER — HYDROXYZINE HCL 25 MG PO TABS
25.0000 mg | ORAL_TABLET | Freq: Three times a day (TID) | ORAL | Status: DC | PRN
  Administered 2020-07-15: 25 mg via ORAL
  Filled 2020-07-13: qty 1

## 2020-07-13 MED ORDER — LORAZEPAM 1 MG PO TABS: 1.0000 mg | ORAL_TABLET | Freq: Four times a day (QID) | ORAL | Status: DC | PRN

## 2020-07-13 MED ORDER — LAMOTRIGINE 25 MG PO TABS
50.0000 mg | ORAL_TABLET | Freq: Two times a day (BID) | ORAL | Status: DC
  Administered 2020-07-13 – 2020-07-16 (×6): 50 mg via ORAL
  Filled 2020-07-13 (×10): qty 2

## 2020-07-13 MED ORDER — ALUM & MAG HYDROXIDE-SIMETH 200-200-20 MG/5ML PO SUSP: 30.0000 mL | ORAL | Status: DC | PRN

## 2020-07-13 MED ORDER — ZIPRASIDONE MESYLATE 20 MG IM SOLR
20.0000 mg | Freq: Four times a day (QID) | INTRAMUSCULAR | Status: DC | PRN
Start: 2020-07-13 — End: 2020-07-16

## 2020-07-13 MED ORDER — MAGNESIUM HYDROXIDE 400 MG/5ML PO SUSP: 30.0000 mL | Freq: Every day | ORAL | Status: DC | PRN

## 2020-07-13 MED ORDER — TRAZODONE HCL 50 MG PO TABS
50.0000 mg | ORAL_TABLET | Freq: Every evening | ORAL | Status: DC | PRN
  Administered 2020-07-15: 50 mg via ORAL
  Filled 2020-07-13: qty 1

## 2020-07-13 MED ORDER — RISPERIDONE 2 MG PO TBDP: 2.0000 mg | ORAL_TABLET | Freq: Three times a day (TID) | ORAL | Status: DC | PRN

## 2020-07-13 MED ORDER — LEVETIRACETAM 750 MG PO TABS
1500.0000 mg | ORAL_TABLET | Freq: Two times a day (BID) | ORAL | Status: DC
  Administered 2020-07-13 – 2020-07-16 (×6): 1500 mg via ORAL
  Filled 2020-07-13 (×10): qty 2

## 2020-07-13 MED ORDER — RISPERIDONE 1 MG PO TBDP
1.0000 mg | ORAL_TABLET | Freq: Two times a day (BID) | ORAL | Status: DC
  Administered 2020-07-13 – 2020-07-16 (×6): 1 mg via ORAL
  Filled 2020-07-13 (×10): qty 1

## 2020-07-13 MED ORDER — ACETAMINOPHEN 325 MG PO TABS
650.0000 mg | ORAL_TABLET | Freq: Four times a day (QID) | ORAL | Status: DC | PRN
  Administered 2020-07-15: 650 mg via ORAL
  Filled 2020-07-13: qty 2
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
## 2020-07-13 NOTE — ED Notes (Signed)
Pt has been alert this AM. Medication compliant. Pt ate breakfast. Pt has been sleeping. Pt resting comfortably. Sitter at bedside.

## 2020-07-13 NOTE — [facility] Suicide Risk Assessment (Signed)
[facility] Admission Suicide Risk Assessment   Nursing information obtained from:    Demographic factors:    Current Mental Status:    Loss Factors:    Historical Factors:    Risk Reduction Factors:     Total Time spent with patient: 30 minutes Principal Problem: <principal problem not specified> Diagnosis:  Active Problems:   MDD (major depressive disorder), recurrent, severe, with psychosis (HCC)   Bipolar disorder (HCC)  Subjective Data: Patient is seen and examined.  Patient is a 30 year old female familiar to me from 2 previous psychiatric admissions at our facility.  She has been diagnosed with several things over the years.  She has been diagnosed with adjustment disorder, depression, bipolar disorder and is well I suspect intellectual deficit who once again presents to the [facility] emergency department under involuntary commitment after threatening to run into traffic, assaulting her family members, and being agitated.  She was also noncompliant with her medications which include mood stability medication as well as antiseizure medications.  She has a past medical history significant for seizure disorder and cerebral palsy.  I last saw the patient on 07/13/2019.  It was very similar to all the previous ER visits and admissions.  She now apparently has a guardian, and review of the electronic medical record revealed that in late April she was actually arrested.  She was taken to the [facility] detention center.  She was arrested because she was blocking traffic near her home.  [facility] had multiple phone calls between April ninth and April 28.  Now the patient has a guardian outside of her family.  The family was requesting that the patient receive some form of a neuropsychiatric evaluation.  Part of my request from the last hospitalization that I cared for her was that she needed neuropsychiatric evaluation to determine her intellectual  disability.  It also appears that the patient has been noncompliant with her Risperdal, Keppra and Lamictal.  The Risperdal apparently was started recently when the patient presented to the [facility] on 05/14/2020.  She was kept there for 3 days.  She speaks Swahili, and be Paediatric nurse was used.  Continued Clinical Symptoms:    The "Alcohol Use Disorders Identification Test", Guidelines for Use in Primary Care, Second Edition.  World Science writer [facility]). Score between 0-7:  no or low risk or alcohol related problems. Score between 8-15:  moderate risk of alcohol related problems. Score between 16-19:  high risk of alcohol related problems. Score 20 or above:  warrants further diagnostic evaluation for alcohol dependence and treatment.   CLINICAL FACTORS:   Bipolar Disorder:   Mixed State Depression:   Aggression Impulsivity Personality Disorders:   Cluster C Epilepsy More than one psychiatric diagnosis Unstable or Poor Therapeutic Relationship Previous Psychiatric Diagnoses and Treatments Medical Diagnoses and Treatments/Surgeries   Musculoskeletal: Strength & Muscle Tone: within normal limits Gait & Station: normal Patient leans: N/A  Psychiatric Specialty Exam:  Presentation  General Appearance: Disheveled  Eye Contact:Fair  Speech:Normal Rate  Speech Volume:Normal  Handedness:Right   Mood and Affect  Mood:Anxious  Affect:Constricted   Thought Process  Thought Processes:Coherent  Descriptions of Associations:Tangential  Orientation:Full (Time, Place and Person)  Thought Content:Rumination  History of Schizophrenia/Schizoaffective disorder:No  Duration of Psychotic Symptoms:Less than six months  Hallucinations:No data recorded Ideas of Reference:None  Suicidal Thoughts:No data recorded Homicidal Thoughts:No data recorded  Sensorium  Memory:Immediate Fair; Recent Fair; Remote  Fair  Judgment:Fair  Insight:Fair   Executive Functions  Concentration:Fair  Attention Span:Fair  Recall:Fair  Fund of Knowledge:Fair  Language:Fair   Psychomotor Activity  Psychomotor Activity:No data recorded  Assets  Assets:Communication Skills; Financial Resources/Insurance; Housing; Physical Health   Sleep  Sleep:No data recorded   Physical Exam: Physical Exam Vitals and nursing note reviewed.  Constitutional:      Appearance: Normal appearance.  HENT:     Head: Normocephalic and atraumatic.  Pulmonary:     Effort: Pulmonary effort is normal.  Neurological:     Mental Status: She is alert.    Review of Systems  Neurological: Positive for seizures.  All other systems reviewed and are negative.  There were no vitals taken for this visit. There is no height or weight on file to calculate BMI.   COGNITIVE FEATURES THAT CONTRIBUTE TO RISK:  Closed-mindedness, Loss of executive function, Polarized thinking and Thought constriction (tunnel vision)    SUICIDE RISK:   Mild:  Suicidal ideation of limited frequency, intensity, duration, and specificity.  There are no identifiable plans, no associated intent, mild dysphoria and related symptoms, good self-control (both objective and subjective assessment), few other risk factors, and identifiable protective factors, including available and accessible social support.  PLAN OF CARE: Patient is seen and examined.  Patient is a 30 year old female with the above-stated past psychiatric and medical history who was admitted secondary to once again running into traffic, supposedly trying to kill her self, and assaulting her family members.  She will be admitted to the hospital.  She will be integrated in the milieu.  She will be encouraged to attend groups.  We will go on and restart her Risperdal, Keppra, and Lamictal.  We will attempt to contact the guardian, but review of the electronic medical record with all the calls  from [facility] nurse [facility] appear that is very difficult to get in touch with her.  I feel like a lot of what is going on here now as an attempt to get her placed into a group home.  One of the continuing factors through the review of the electronic medical record is an attempt to get her placed.  I do not believe that we will be successful with that until she has intellectual disability assessment done completely.  Review of her admission laboratories showed some mild abnormalities with her chloride and bicarb.  Creatinine was normal at 0.92.  Liver function enzymes were normal.  She had a mild anemia with a hemoglobin of 11.1 and hematocrit of 35.  The rest of her indices appear to be rather normal.  Differential was normal.  Acetaminophen was less than 10, salicylate less than 7.  Pregnancy test was negative.  Respiratory panel for influenza A, B and coronavirus were all negative.  Blood alcohol was less than 10, salicylate less than 7.  EKG had some mild abnormalities, but was essentially normal.  Her vital signs are stable, she is afebrile.  Pulse oximetry on room air was 97%.  I certify that inpatient services furnished can reasonably be expected to improve the patient's condition.   Antonieta Pert, MD 07/13/2020, 2:46 PM

## 2020-07-13 NOTE — Progress Notes (Signed)
Adult Psychoeducational Group Note  Date:  07/13/2020 Time:  11:15 PM  Group Topic/Focus:  Wrap-Up Group:   The focus of this group is to help patients review their daily goal of treatment and discuss progress on daily workbooks.  Participation Level:  Did Not Attend  Participation Quality:  Did Not Attend  Affect:  Did Not Attend  Cognitive:  Did Not Attend  Insight: None  Engagement in Group:  Did Not Attend  Modes of Intervention:  Did Not Attend  Additional Comments:  Pt did not attend evening wrap up group tonight.  Felipa Furnace 07/13/2020, 11:15 PM
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
## 2020-07-13 NOTE — ED Notes (Signed)
Pt DC d off unit to [facility] per provider. Pt alert, calm, cooperative, no s/s of distress. DC information and belongings given to [employer] for facility stay. Pt ambulatory off unit, escorted and transported by [employer].
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
## 2020-07-14 DIAGNOSIS — F3160 Bipolar disorder, current episode mixed, unspecified: Principal | ICD-10-CM

## 2020-07-14 NOTE — Progress Notes (Signed)
Adult Psychoeducational Group Note  Date:  07/14/2020 Time:  8:52 PM  Group Topic/Focus:  Wrap-Up Group:   The focus of this group is to help patients review their daily goal of treatment and discuss progress on daily workbooks.  Participation Level:  Did Not Attend  Participation Quality:  Did Not Attend  Affect:  Did Not Attend  Cognitive:  Did Not Attend  Insight: None  Engagement in Group:  Did Not Attend  Modes of Intervention:  Did Not Attend  Additional Comments:  Pt did not attend evening wrap up group tonight.  Felipa Furnace 07/14/2020, 8:52 PM

## 2020-07-14 NOTE — [facility] Group Notes (Signed)
.  Psychoeducational Group Note  Date: 07/14/2020 Time: 0900-1000    Goal Setting   Purpose of Group: This group helps to provide patients with the steps of setting a goal that is specific, measurable, attainable, realistic and time specific. A discussion on how we keep ourselves stuck with negative self talk.    Participation Level:  Did not attend   Britiny Defrain A  
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
## 2020-07-14 NOTE — Progress Notes (Signed)
Dar Note: Patient presents with a calm affect and mood. Denies suicidal thoughts, auditory and visual hallucinations.  Assessment completed with a Swahili interpreter.  Medications given as prescribed.  Patient encouraged to attend to her personal hygiene.  States goal for today is to go home.  Routine safety checks maintained.  Patient is safe on and off the unit.

## 2020-07-15 DIAGNOSIS — T1491XA Suicide attempt, initial encounter: Secondary | ICD-10-CM
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
## 2020-07-15 NOTE — Progress Notes (Signed)
Adult Psychoeducational Group Note  Date:  07/15/2020 Time:  9:10 PM  Group Topic/Focus:  Wrap-Up Group:   The focus of this group is to help patients review their daily goal of treatment and discuss progress on daily workbooks.  Participation Level:  Did Not Attend  Participation Quality:  Did Not Attend  Affect:  Did Not Attend  Cognitive:  Did Not Attend  Insight: None  Engagement in Group:  Did Not Attend  Modes of Intervention:  Did Not Attend  Additional Comments:  Pt did not attend evening wrap up group tonight.  Felipa Furnace 07/15/2020, 9:10 PM
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
## 2020-07-15 NOTE — [facility] Group Notes (Signed)
Adult Psychoeducational Group Not Date:  07/15/2020 Time:  0900-1045 Group Topic/Focus: PROGRESSIVE RELAXATION. A group where deep breathing is taught and tensing and relaxation muscle groups is used. Imagery is used as well.  Pts are asked to imagine 3 pillars that hold them up when they are not able to hold themselves up.  Participation Level:  Did not attend     Umaima Scholten A    

## 2020-07-15 NOTE — Progress Notes (Signed)
Progress note    07/15/20 1100  Psych Admission Type (Psych Patients Only)  Admission Status Involuntary  Psychosocial Assessment  Patient Complaints Anxiety  Eye Contact Brief  Facial Expression Anxious;Pensive  Affect Anxious;Preoccupied  Speech Logical/coherent  Interaction Cautious;Forwards little;Guarded;Minimal  Motor Activity Slow  Appearance/Hygiene Disheveled  Behavior Characteristics Cooperative;Appropriate to situation;Anxious  Mood Anxious;Pleasant  Thought Process  Coherency WDL  Content Blaming others  Delusions Controlled;Persecutory  Perception WDL  Hallucination None reported or observed  Judgment Limited  Confusion None  Danger to Self  Current suicidal ideation? Denies  Danger to Others  Danger to Others None reported or observed

## 2020-07-15 NOTE — Plan of Care (Signed)
  Problem: Education: Goal: Knowledge of [facility] Education information/materials will improve Outcome: Progressing Goal: Emotional status will improve Outcome: Progressing Goal: Mental status will improve Outcome: Progressing Goal: Verbalization of understanding the information provided will improve Outcome: Progressing   

## 2020-07-16 ENCOUNTER — Telehealth: Payer: Self-pay | Admitting: Pediatric Intensive Care

## 2020-07-16 MED ORDER — LAMOTRIGINE 25 MG PO TABS: 50.0000 mg | ORAL_TABLET | Freq: Two times a day (BID) | ORAL | 0 refills | Status: DC

## 2020-07-16 MED ORDER — LEVETIRACETAM 500 MG PO TABS: 1500.0000 mg | ORAL_TABLET | Freq: Two times a day (BID) | ORAL | 0 refills | Status: DC

## 2020-07-16 MED ORDER — RISPERIDONE 1 MG PO TBDP
1.0000 mg | ORAL_TABLET | Freq: Two times a day (BID) | ORAL | 0 refills | Status: DC
Start: 2020-07-16 — End: 2020-07-19

## 2020-07-16 NOTE — [facility] Suicide Risk Assessment (Signed)
[facility] Discharge Suicide Risk Assessment   Principal Problem: Suicidal behavior with attempted self-injury [facility]) Discharge Diagnoses: Principal Problem:   Suicidal behavior with attempted self-injury (HCC)   Total Time spent with patient: 20 minutes  Musculoskeletal: Strength & Muscle Tone: within normal limits Gait & Station: normal Patient leans: N/A  Psychiatric Specialty Exam  Presentation  General Appearance: Casual  Eye Contact:Fair  Speech:Normal Rate  Speech Volume:Normal  Handedness:Right   Mood and Affect  Mood:Euthymic  Duration of Depression Symptoms: No data recorded Affect:Congruent   Thought Process  Thought Processes:Coherent  Descriptions of Associations:Circumstantial  Orientation:Full (Time, Place and Person)  Thought Content:Logical  History of Schizophrenia/Schizoaffective disorder:No  Duration of Psychotic Symptoms:Less than six months  Hallucinations:Hallucinations: None  Ideas of Reference:None  Suicidal Thoughts:Suicidal Thoughts: No  Homicidal Thoughts:Homicidal Thoughts: No   Sensorium  Memory:Immediate Fair; Recent Fair; Remote Fair  Judgment:Impaired  Insight:Lacking   Executive Functions  Concentration:Good  Attention Span:Fair  Recall:Good  Fund of Knowledge:Fair  Language:Good   Psychomotor Activity  Psychomotor Activity:Psychomotor Activity: Normal   Assets  Assets:Desire for Improvement; Resilience   Sleep  Sleep:Sleep: Good Number of Hours of Sleep: 6.75   Physical Exam: Physical Exam Vitals and nursing note reviewed.  Constitutional:      Appearance: Normal appearance.  HENT:     Head: Normocephalic and atraumatic.  Pulmonary:     Effort: Pulmonary effort is normal.  Neurological:     General: No focal deficit present.     Mental Status: She is alert.    ROS Blood pressure (!) 89/70, pulse (!) 117, temperature (!) 97.2 F (36.2 C), temperature source Oral, resp. rate 20, height  5' 1.02" (1.55 m), weight 83.9 kg, SpO2 100 %. Body mass index is 34.93 kg/m.  Mental Status Per Nursing Assessment::   On Admission:  NA  Demographic Factors:  Low socioeconomic status and Unemployed  Loss Factors: Legal issues  Historical Factors: Impulsivity  Risk Reduction Factors:   Living with another person, especially a relative  Continued Clinical Symptoms:  Depression:   Impulsivity More than one psychiatric diagnosis  Cognitive Features That Contribute To Risk:  Thought constriction (tunnel vision)    Suicide Risk:  Minimal: No identifiable suicidal ideation.  Patients presenting with no risk factors but with morbid ruminations; may be classified as minimal risk based on the severity of the depressive symptoms    Plan Of Care/Follow-up recommendations:  Activity:  ad lib  Antonieta Pert, MD 07/16/2020, 7:53 AM

## 2020-07-16 NOTE — Progress Notes (Signed)
Pt discharged to lobby. Pt was stable and appreciative at that time. All papers and prescriptions were given and valuables returned. Verbal understanding expressed. Denies SI/HI and A/VH. Pt given opportunity to express concerns and ask questions.  

## 2020-07-16 NOTE — Progress Notes (Signed)
Recreation Therapy Notes  Date: 5.23.22 Time: 1000 Location: 500 [address] Dayroom  Group Topic: Coping Skills  Goal Area(s) Addresses:  Patient will define what a coping skill is. Patient will identify positive and negative coping skills. Patient will identify coping skills that can be used post d/c.  Intervention: Worksheet  Activity: Healthy vs. Unhealthy Coping Strategies.  Patients were to identify a current problem they are dealing with.  Patients then had to identify unhealthy coping strategies they have used and the consequences of those coping strategies.  Patients also had to identify healthy coping strategies, the expected outcome and barriers to using these coping strategies.  Education: Coping Skills, Discharge Planning.   Education Outcome: Acknowledges understanding/In group clarification offered/Needs additional education.   Clinical Observations/Feedback: Pt did not attend group session.    Janai Maudlin, LRT/CTRS         Tarynn Garling A 07/16/2020 12:58 PM 

## 2020-07-16 NOTE — Telephone Encounter (Signed)
Attempt to reach [employer], client's legal guardian. Her voice mail is not set up. Unable to leave message. [name] RN BSN CNP [phone]
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
## 2020-07-16 NOTE — [facility] Suicide Risk Assessment (Signed)
[facility] INPATIENT:  Family/Significant Other Suicide Prevention Education  Suicide Prevention Education:  Contact Attempts: Legal Franchot Gallo [phone]), has been identified by the patient as the family member/significant other with whom the patient will be residing, and identified as the person(s) who will aid the patient in the event of Patricia mental health crisis.  With written consent from the patient, two attempts were made to provide suicide prevention education, prior to and/or following the patient's discharge.  We were unsuccessful in providing suicide prevention education.  Patricia suicide education pamphlet was given to the patient to share with family/significant other.  Date and time of first attempt: 07/16/2020 CSW left HIPAA compliant voicemail for this patients guardian as well as guardians supervisor Yolanda Manges [phone]) Date and time of second attempt: Second attempt still needs to be made.  Patricia [name] Patricia [name] 07/16/2020, 9:35 AM

## 2020-07-16 NOTE — Progress Notes (Signed)
  [facility] Adult Case Management Discharge Plan :  Will you be returning to the same living situation after discharge:  Yes,  will be returning home At discharge, do you have transportation home?: No. Safe Transport will be arranged Do you have the ability to pay for your medications: Yes,  has insurance  Release of information consent forms completed and in the chart;  Patient's signature needed at discharge.  Patient to Follow up at:  Follow-up Information    [facility]. Go on 07/18/2020.   Specialty: Behavioral Health Why: You have an appointment for therapy services on 07/18/20 at 7:45 am.  You also have an appointment for medication management on 07/25/20 at 7:45 am.  These appointments will be held in person and are first come, first served.   Contact information: [address] [place] [address] [phone]              Next level of care provider has access to [facility] Link:yes  Safety Planning and Suicide Prevention discussed: Yes,  with legal guardian     Has patient been referred to the Quitline?: N/A patient is not a smoker  Patient has been referred for addiction treatment: Pt. refused referral  Otelia Santee, LCSW 07/16/2020, 10:21 AM

## 2020-07-16 NOTE — [facility] Suicide Risk Assessment (Signed)
[facility] INPATIENT:  Family/Significant Other Suicide Prevention Education  Suicide Prevention Education:  Education Completed; Legal Guardian, Patricia [name] [phone]) has been identified by the patient as the family member/significant other with whom the patient will be residing, and identified as the person(s) who will aid the patient in the event of a mental health crisis (suicidal ideations/suicide attempt).  With written consent from the patient, the family member/significant other has been provided the following suicide prevention education, prior to the and/or following the discharge of the patient.  CSW spoke with this patients legal guardian who reports this patient can return home. Legal guardian would like Safe Transport arranged for this patient to return home.   The suicide prevention education provided includes the following:  Suicide risk factors  Suicide prevention and interventions  National Suicide Hotline telephone number  [name] assessment telephone number  [facility] Emergency Assistance 911  [facility] and/or Residential Mobile Crisis Unit telephone number  Request made of family/significant other to:  Remove weapons (e.g., guns, rifles, knives), all items previously/currently identified as safety concern.    Remove drugs/medications (over-the-counter, prescriptions, illicit drugs), all items previously/currently identified as a safety concern.  The family member/significant other verbalizes understanding of the suicide prevention education information provided.  The family member/significant other agrees to remove the items of safety concern listed above.  Patricia [name] 07/16/2020, 10:17 AM

## 2020-07-17 ENCOUNTER — Telehealth: Payer: Self-pay | Admitting: Family Medicine

## 2020-07-17 ENCOUNTER — Telehealth: Payer: Self-pay

## 2020-07-17 LAB — LEVETIRACETAM LEVEL: Levetiracetam Lvl: 43.1 ug/mL — ABNORMAL HIGH (ref 10.0–40.0)

## 2020-07-17 MED ORDER — LAMOTRIGINE 25 MG PO TABS: 75.0000 mg | ORAL_TABLET | Freq: Two times a day (BID) | ORAL | 0 refills | Status: DC

## 2020-07-17 NOTE — Telephone Encounter (Signed)
Per Neurology, titrating to 75 mg BID of lamictal. Patient needs refill.  [name], MD  Family Medicine Teaching Service

## 2020-07-17 NOTE — Telephone Encounter (Signed)
Called pharmacy to discuss packaging of medications. Pharmacy will package doses of risperidone and keppra together in blister pack and leave Lamictal separate to facilitate patient taking medications appropriately.   Dr. Manson Passey made aware and refill requested, to be delivered 07/25/20.  Arman Bogus RN BSn PCCN  [facility] [phone]-cell [phone]-office

## 2020-07-17 NOTE — Congregational Nurse Program (Signed)
Patient discharged home. Home visit completed. Reviewed and inspected medications. Noted blister pack with Keppra and Risperidone as well blister pack with Keppra and Lamictal. I will contact pharmacy to request consistent packaging of medications.   Organized current medications so patient can take appropriate doses at appropriate times. Educated patient on administration.  Arman Bogus RN BSn PCCN  [facility] [phone]-cell [phone]-office

## 2020-07-18 ENCOUNTER — Telehealth: Payer: Self-pay | Admitting: Pediatric Intensive Care

## 2020-07-18 ENCOUNTER — Telehealth: Payer: Self-pay | Admitting: Neurology

## 2020-07-18 NOTE — Telephone Encounter (Signed)
Left HIPAA compliant message for Ms Bibbs to return call ASAP [phone]. [name] RN BSN CNP [phone]

## 2020-07-18 NOTE — Telephone Encounter (Signed)
Patient recently had psych admission, in ER labs showed , Lamictal level was < 1.0, Keppra level was 43.1. Read psych note from 5/20, noncompliant with Risperdal, Keppra, Lamictal, these meds were restarted. Not clear she had any recent seizures. I'll route this to PCP Dr. Manson Passey, who was working with pharmacy yesterday, we generally, do a slow increase of Lamictal to prevent adverse effect, increasing every 2 weeks. Looks like started back on 50 mg twice daily at [facility], likely stay here for 2 weeks before increasing to 75 mg twice daily. Unless psych felt differently.

## 2020-07-19 ENCOUNTER — Other Ambulatory Visit: Payer: Self-pay

## 2020-07-19 ENCOUNTER — Telehealth: Payer: Self-pay

## 2020-07-19 ENCOUNTER — Encounter (HOSPITAL_COMMUNITY): Payer: Self-pay | Admitting: Physician Assistant

## 2020-07-19 ENCOUNTER — Ambulatory Visit (INDEPENDENT_AMBULATORY_CARE_PROVIDER_SITE_OTHER): Payer: Medicaid Other | Admitting: Physician Assistant

## 2020-07-19 VITALS — BP 102/67 | HR 76 | Ht 61.0 in | Wt 188.0 lb

## 2020-07-19 DIAGNOSIS — F3160 Bipolar disorder, current episode mixed, unspecified: Secondary | ICD-10-CM

## 2020-07-19 DIAGNOSIS — F4325 Adjustment disorder with mixed disturbance of emotions and conduct: Secondary | ICD-10-CM | POA: Diagnosis not present

## 2020-07-19 DIAGNOSIS — F39 Unspecified mood [affective] disorder: Secondary | ICD-10-CM | POA: Insufficient documentation

## 2020-07-19 DIAGNOSIS — F0281 Dementia in other diseases classified elsewhere with behavioral disturbance: Secondary | ICD-10-CM | POA: Diagnosis not present

## 2020-07-19 DIAGNOSIS — F02B18 Dementia in other diseases classified elsewhere, moderate, with other behavioral disturbance: Secondary | ICD-10-CM

## 2020-07-19 MED ORDER — RISPERIDONE 2 MG PO TBDP: 2.0000 mg | ORAL_TABLET | Freq: Two times a day (BID) | ORAL | 1 refills | Status: DC

## 2020-07-19 MED ORDER — LAMOTRIGINE 25 MG PO TABS
50.0000 mg | ORAL_TABLET | Freq: Every day | ORAL | 1 refills | Status: DC
Start: 2020-07-19 — End: 2020-09-20
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
## 2020-07-19 NOTE — Congregational Nurse Program (Signed)
  Dept: [phone]   Congregational Nurse Program Note  Date of Encounter: 07/17/2020  Past Medical History: Past Medical History:  Diagnosis Date  . Bipolar 1 disorder (HCC)   . Cerebral palsy (HCC)   . Hypertension   . Seizures (HCC)   . Suicidal behavior     Encounter Details:  07/17/20 1800 Patient mother and brother called me on phone. Patient is having a mental health crisis. Patient trying to run away from home to go to the road and wants to be "hit by the cars".Family reports that patient is undressing and crying about her deceased father who died in [place] a while back.Family is able to restrain the patient. Support offered.  Arman Bogus RN BSn PCCN  [facility] [phone]-cell [phone]-office

## 2020-07-19 NOTE — Telephone Encounter (Signed)
Contacted patient mother to remind her that patient has an appointment at 10 am today. Transportation scheduled with [employer]

## 2020-07-19 NOTE — Telephone Encounter (Signed)
Transportation assistance provided from [employer] appointment to home.  Arman Bogus RN BSn PCCN  [facility] [phone]-cell [phone]-office

## 2020-07-25 ENCOUNTER — Telehealth (HOSPITAL_COMMUNITY): Payer: Self-pay | Admitting: *Deleted

## 2020-07-25 NOTE — Telephone Encounter (Signed)
[employer] PRIOR AUTHORIZATION APPROVED risperiDONE (RISPERDAL M-TABS) 2 MG disintegrating tablet   P.A. 3 : [id number]   EFFECTIVE:  07/25/2020    THRU    07/20/2021

## 2020-08-13 NOTE — Congregational Nurse Program (Signed)
  Dept: [phone]   Congregational Nurse Program Note  Date of Encounter: 08/13/2020  Past Medical History: Past Medical History:  Diagnosis Date   Bipolar 1 disorder (HCC)    Cerebral palsy (HCC)    Hypertension    Seizures (HCC)    Suicidal behavior     Encounter Details: Home visit completed,medications inspected. Patient has been compliant with medication. Her brother Faustino Congress has been administering the medications. Patient did verbalize "I want to go to the road and get hit by the cars" and [place]". Family is abe to manage the patient and family states that recent adjustment of her medications has been helpful.  Nicole Cella Rosalena Mccorry RN BSN PCCN [phone]-Office [phone]-Cell

## 2020-08-21 ENCOUNTER — Telehealth: Payer: Self-pay

## 2020-08-21 NOTE — Telephone Encounter (Signed)
Contacted family with appointment reminder for tomorrow 08/22/20. Legal Guardian Sander Radon will be picking up and accompanying  patient for this appointment.   Arman Bogus RN BSn PCCN  [facility] [phone]-cell [phone]-office

## 2020-08-22 ENCOUNTER — Other Ambulatory Visit: Payer: Self-pay

## 2020-08-22 ENCOUNTER — Telehealth (HOSPITAL_COMMUNITY): Payer: Self-pay | Admitting: *Deleted

## 2020-08-22 ENCOUNTER — Encounter (HOSPITAL_COMMUNITY): Payer: Self-pay | Admitting: Physician Assistant

## 2020-08-22 ENCOUNTER — Ambulatory Visit (INDEPENDENT_AMBULATORY_CARE_PROVIDER_SITE_OTHER): Payer: Medicaid Other | Admitting: Physician Assistant

## 2020-08-22 VITALS — BP 99/73 | HR 65 | Ht 61.0 in | Wt 194.0 lb

## 2020-08-22 DIAGNOSIS — F02B18 Dementia in other diseases classified elsewhere, moderate, with other behavioral disturbance: Secondary | ICD-10-CM

## 2020-08-22 DIAGNOSIS — F4325 Adjustment disorder with mixed disturbance of emotions and conduct: Secondary | ICD-10-CM | POA: Diagnosis not present

## 2020-08-22 DIAGNOSIS — F3160 Bipolar disorder, current episode mixed, unspecified: Secondary | ICD-10-CM | POA: Diagnosis not present

## 2020-08-22 DIAGNOSIS — F0281 Dementia in other diseases classified elsewhere with behavioral disturbance: Secondary | ICD-10-CM

## 2020-08-22 NOTE — Progress Notes (Signed)
BH MD/PA/NP OP Progress Note  08/22/2020 9:06 AM Patricia [name]  MRN:  [id number]  Chief Complaint: Follow up and medication management  HPI:   Patricia [name] is a 30 year old female with a past psychiatric history significant for adjustment disorder and bipolar disorder who presents to [facility], accompanied by her sister and legal guardian/social worker Herbert Seta [place]).  Patient presents today for follow-up regarding her medications.  Due to language barrier, interpretive services were utilized during the duration of the encounter.  Per patient's sister, patient has been doing well on her medications and she reports no issues.  She expresses that the patient still has been going to the roadside where cars are parked, however, patient has not gone out in the middle of traffic in roughly a month.  She also expresses that the patient has not been exhibiting any of the past behaviors that were reported during the last encounter.  She expresses that she has not been aggressive or violent towards any of the siblings in the household.  Patient denies depressive symptoms or anxiety.  She expresses that she feels dizzy at times and may fall down.  Per patient's sister, she is unsure why the patient is dizzy but states that the patient has been doing well overall.  Patient's sister is still interested in placing the patient on an injectable due experiencing some issues with giving the patient her medication.  Provider informed patient's and patient's sister that the patient could be started on Invega.  Provider informed social worker that nurses would be notified and that the process to start patient on an injectable will be underway soon enough.  Patient denies suicidal or homicidal ideations.  She further denies auditory or visual hallucinations.  Per patient's sister, patient sleeps up to 4 times a day but she is unable to determine how long she is sleeping 4.   Patient's daughter expresses that he is also unable to determine how many meals she has a day.  She reports that patient eats whenever she is hungry.  Patient denies alcohol consumption, tobacco use, and illicit drug use.  Visit Diagnosis:    ICD-10-CM   1. Adjustment disorder with mixed disturbance of emotions and conduct  F43.25     2. 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 Bipolar affective disorder, current episode mixed, current episode severity unspecified (HCC)  F31.60       Past Psychiatric History:  Mixed bipolar affective disorder Major neurocognitive disorder Adjustment disorder with mixed disturbance of emotions and conduct  Past Medical History:  Past Medical History:  Diagnosis Date   Bipolar 1 disorder (HCC)    Cerebral palsy (HCC)    Hypertension    Seizures (HCC)    Suicidal behavior    History reviewed. No pertinent surgical history.  Family Psychiatric History:  Per patient's sister, they have a younger sister with a disability but it is not psychiatric in nature  Family History:  Family History  Problem Relation Age of Onset   Hepatitis Brother     Social History:  Social History   Socioeconomic History   Marital status: Single    Spouse name: Not on file   Number of children: 1   Years of education: Not on file   Highest education level: Not on file  Occupational History    Comment: none  Tobacco Use   Smoking status: Never  Smokeless tobacco: Never  Vaping Use   Vaping Use: Unknown  Substance and Sexual Activity   Alcohol use: Yes    Alcohol/week: 2.0 standard drinks    Types: 2 Cans of beer per week    Comment: occasional alcohol use   Drug use: Never   Sexual activity: Not Currently  Other Topics Concern   Not on file  Social History Narrative   ** Merged History Encounter **        ** Merged History Encounter **       ** Merged History Encounter  **       Lives with mother, family   Social Determinants of Health   Financial Resource Strain: Not on file  Food Insecurity: Not on file  Transportation Needs: Not on file  Physical Activity: Not on file  Stress: Not on file  Social Connections: Not on file    Allergies: Not on File  Metabolic Disorder Labs: Lab Results  Component Value Date   HGBA1C 4.9 07/11/2019   MPG 105.41 06/30/2019   No results found for: PROLACTIN Lab Results  Component Value Date   CHOL 129 06/30/2019   TRIG 55 06/30/2019   HDL 38 (L) 06/30/2019   CHOLHDL 3.4 06/30/2019   VLDL 11 06/30/2019   LDLCALC 80 06/30/2019   Lab Results  Component Value Date   TSH 1.312 06/30/2019   TSH 1.780 08/04/2017    Therapeutic Level Labs: No results found for: LITHIUM No results found for: VALPROATE No components found for:  CBMZ  Current Medications: Current Outpatient Medications  Medication Sig Dispense Refill   levETIRAcetam (KEPPRA) 500 MG tablet Take 3 tablets (1,500 mg total) by mouth 2 (two) times daily. 180 tablet 0   risperiDONE (RISPERDAL M-TABS) 2 MG disintegrating tablet Take 1 tablet (2 mg total) by mouth 2 (two) times daily. 60 tablet 1   lamoTRIgine (LAMICTAL) 25 MG tablet Take 2 tablets (50 mg total) by mouth daily. 60 tablet 1   No current facility-administered medications for this visit.     Musculoskeletal: Strength & Muscle Tone: within normal limits Gait & Station: normal Patient leans: N/A  Psychiatric Specialty Exam: Review of Systems  Psychiatric/Behavioral:  Positive for behavioral problems and sleep disturbance. Negative for decreased concentration, dysphoric mood, hallucinations, self-injury and suicidal ideas. The patient is not nervous/anxious and is not hyperactive.    Blood pressure 99/73, pulse 65, height 5\' 1"  (1.549 m), weight 194 lb (88 kg).Body mass index is 36.66 kg/m.  General Appearance: Well Groomed  Eye Contact:  Minimal  Speech:  Clear and Coherent  and Normal Rate  Volume:  Normal  Mood:  Euthymic  Affect:  Appropriate  Thought Process:  Coherent and Descriptions of Associations: Loose  Orientation:  Full (Time, Place, and Person)  Thought Content: WDL   Suicidal Thoughts:  No  Homicidal Thoughts:  No  Memory:  Immediate;   Fair Recent;   Fair Remote;   Fair  Judgement:  Impaired  Insight:  Lacking  Psychomotor Activity:  Normal  Concentration:  Concentration: Good and Attention Span: Good  Recall:  of Knowledge: Fair  Language: Good  Akathisia:  NA  Handed:  Left  AIMS (if indicated): not done  Assets:  [employer] Social Support  ADL's:  Impaired  Cognition: Impaired,  Mild  Sleep:  Fair   Screenings: AIMS    Flowsheet Row [facility] (Dis[facility]) [facility]) [facility]  AIMS Total Score 0 0 0      AUDIT    Flowsheet Row [facility]) [facility] Ad[facility] (Discharged) from [facility] ([facility]) [facility]  Alcohol Use Disorder Identification Test Final Score (AUDIT) 0 0 0      PHQ2-9    Flowsheet Row Office Visit from 10/20/2019 in [facility] ED from 09/18/2019 in [facility] EM[facility]Y [facility]PART[facility] Office Visit from 07/11/2019 in [facility] Office Visit from 08/04/2017 in [facility] Office Visit from 07/14/2017 in [facility]  PHQ-2 Total Score 3 4 0 0 0  PHQ-9 Total Score 11 13 -- -- --      Flowsheet Row Office Visit from 07/19/2020 in [facility] Admission (Discharged) [facility] from [facility]12/2020 in [facility]  C-SSRS RISK CATEGORY High Risk Error: Q3, 4, or 5 should not be populated when Q2 is No High Risk        Assessment and Plan:   Patricia [name] is a 30 year old female with a past psychiatric history significant for adjustment disorder and bipolar disorder who presents to [facility], accompanied by her sister and legal guardian/social worker Teacher, English as a foreign language [place]).  Patient presents today for follow-up regarding her medications.  Per patient's sister, patient has been doing well on her medications but still runs into some issues with giving the patient her medication.  Patient's sister is interested in placing the patient on an injectable.  Provider informed patient, patient's sister, and social worker that patient will be set up with an injection and the injectable will be ordered after pharmacy.  Patient's social worker requested a medical release form due to a a new medication being brought on board for the patient.  Provider advised patient to continue taking medications as scheduled until injectable is ordered.  Provider informed social worker that any information regarding medication would be relayed over to her.  1. Adjustment disorder with mixed disturbance of emotions and conduct Patient to continue risperidone 2 mg 2 times daily for the management of her adjustment disorder Patient to continue taking lamotrigine 50 mg daily for the management of her adjustment disorder  2. 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 Bipolar affective disorder, current episode mixed, current episode severity unspecified (HCC) Patient to continue taking risperidone 2 mg 2 times daily for the  management of her bipolar affective disorder Patient to continue taking lamotrigine 50 mg daily for the management of her bipolar affective disorder  Writer to set patient up with injectable.  Once injectable medication is selected provider will relay information to Child psychotherapist.  Follow-up will be established once injectable is selected Provider spent a total of 35 minutes with the patient/reviewing patient's chart   Meta Hatchet, PA 08/22/2020, 9:06 AM

## 2020-08-22 NOTE — Telephone Encounter (Signed)
Sent over a faxed note to Herbert Seta that patient needs to pick up her Invega injection or have it delivered here prior to her getting a shot. She has medicaid. She has [facility] as her preferred pharmacy. Will notify Eddie PA of the need to call her rx to the pharmacy.

## 2020-08-24 NOTE — Congregational Nurse Program (Signed)
  Dept: [phone]   Congregational Nurse Program Note  Date of Encounter: 08/23/20   Past Medical History: Past Medical History:  Diagnosis Date   Bipolar 1 disorder (HCC)    Cerebral palsy (HCC)    Hypertension    Seizures (HCC)    Suicidal behavior     Encounter Details: Home visit completed on 08/23/20 at 7:30 pm. Patient at home with family.Mother reports that she was verbally abusive to her sister in law a few minutes before my arrival. Patient looked upset and attempted to go to the road in my presence but mother was able to redirect.She had not taken the  evening medication yet.  Medication inspected.She is currently taking Lamotrigine 50 mg by mouth twice daily, Keppra 500 mg by mouth twice daily and Risperidone 2 mg by mouth twice daily. The tablets are pre packaged in blister packs which I previously wrote by hand  morning and evening in swahili. She seems to be compliant with medication.  Arman Bogus RN BSn PCCN  [facility] [phone]-cell [phone]-office

## 2020-09-20 ENCOUNTER — Telehealth: Payer: Self-pay | Admitting: Family Medicine

## 2020-09-20 DIAGNOSIS — F4325 Adjustment disorder with mixed disturbance of emotions and conduct: Secondary | ICD-10-CM

## 2020-09-20 DIAGNOSIS — F02B18 Dementia in other diseases classified elsewhere, moderate, with other behavioral disturbance: Secondary | ICD-10-CM

## 2020-09-20 DIAGNOSIS — F0281 Dementia in other diseases classified elsewhere with behavioral disturbance: Secondary | ICD-10-CM

## 2020-09-20 DIAGNOSIS — F3160 Bipolar disorder, current episode mixed, unspecified: Secondary | ICD-10-CM

## 2020-09-20 MED ORDER — LEVETIRACETAM 500 MG PO TABS: 1500.0000 mg | ORAL_TABLET | Freq: Two times a day (BID) | ORAL | 1 refills | Status: DC

## 2020-09-20 MED ORDER — RISPERIDONE 2 MG PO TBDP: 2.0000 mg | ORAL_TABLET | Freq: Two times a day (BID) | ORAL | 1 refills | Status: DC

## 2020-09-20 MED ORDER — LAMOTRIGINE 25 MG PO TABS: 50.0000 mg | ORAL_TABLET | Freq: Two times a day (BID) | ORAL | 3 refills | Status: DC

## 2020-09-20 NOTE — Telephone Encounter (Signed)
Refilled lamictal 50 mg BID (per Neurology), Keppra, and Risperidone.  [name], MD  Family Medicine Teaching Service

## 2020-09-26 IMAGING — CT CT HEAD W/O CM
3 series · 16 of 46 positions shown, 19 images · non-contrast
Comparison: 12/21/2018

CLINICAL DATA: Multiple seizures

EXAM:
CT HEAD WITHOUT CONTRAST
TECHNIQUE: Contiguous axial images were obtained from the base of the skull
through the vertex without intravenous contrast.

[Series 2: head wo · axial · 0.38mm/px · z∈[-132,-12]mm · 10 of 29 slices shown, 13 images]
[im 3/29  brain]
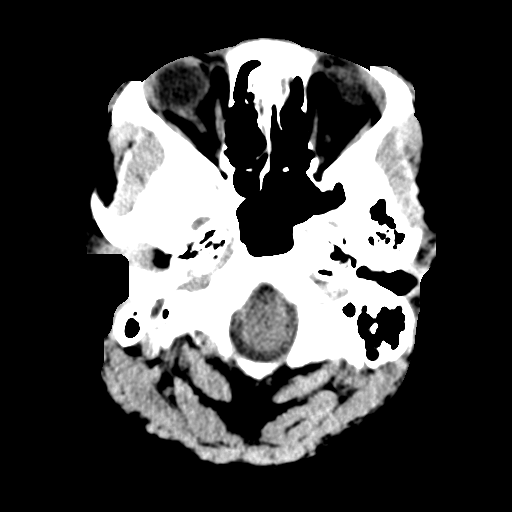
[im 3/29  bone]
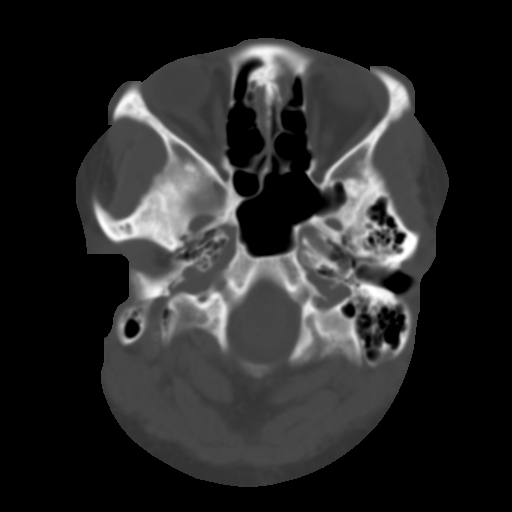
[im 6/29  brain]
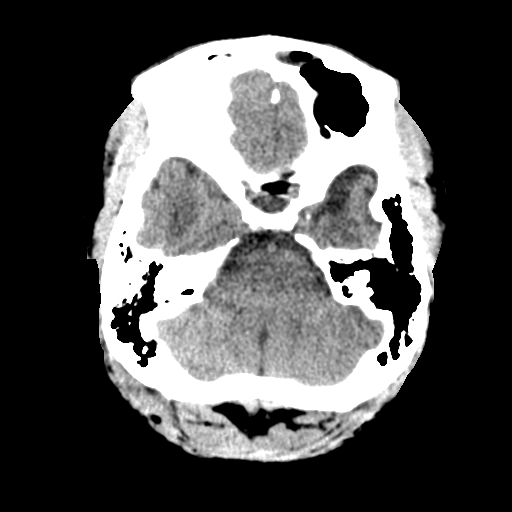
[im 8/29  brain]
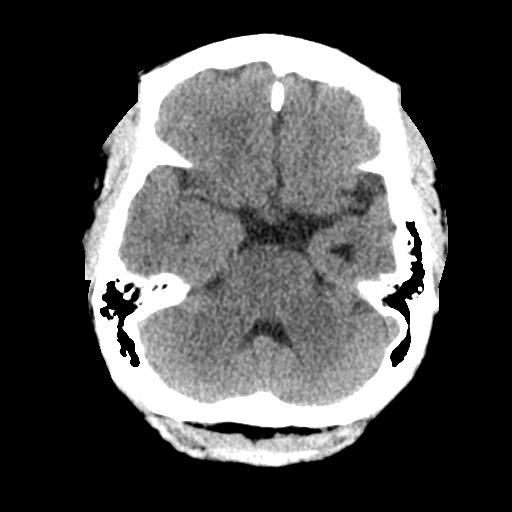
[im 11/29  brain]
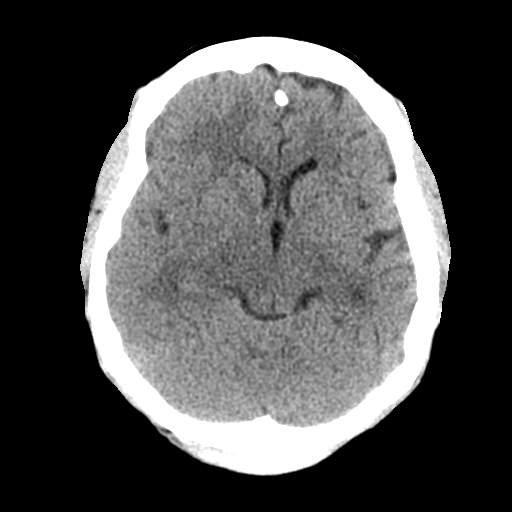
[im 14/29  brain]
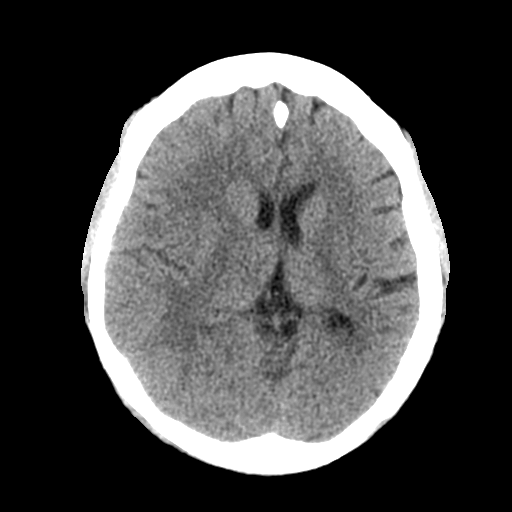
[im 14/29  bone]
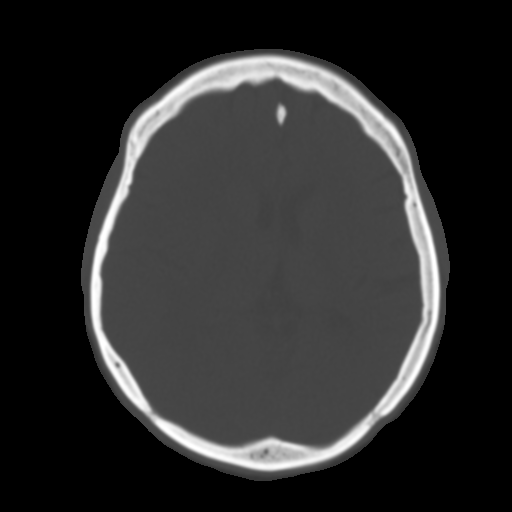
[im 16/29  brain]
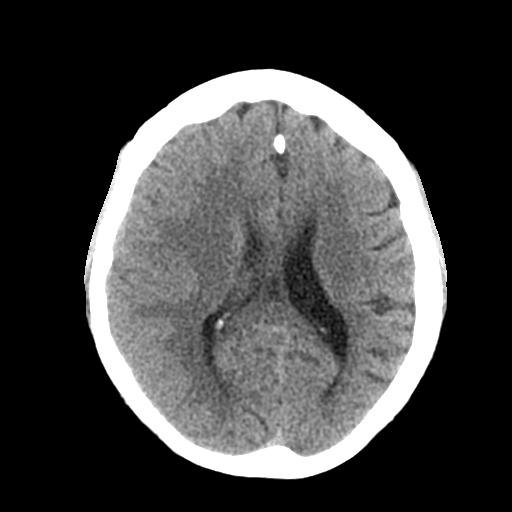
[im 19/29  brain]
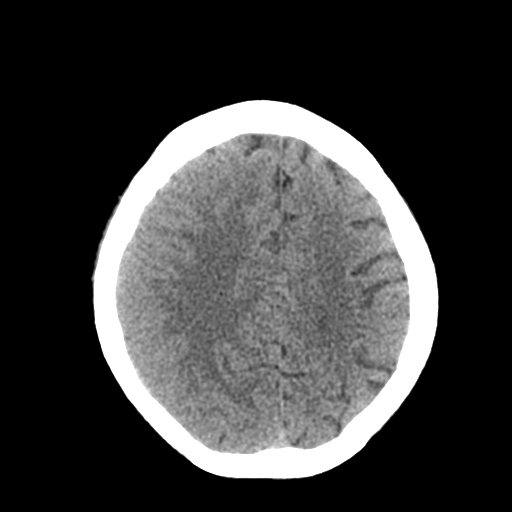
[im 22/29  brain]
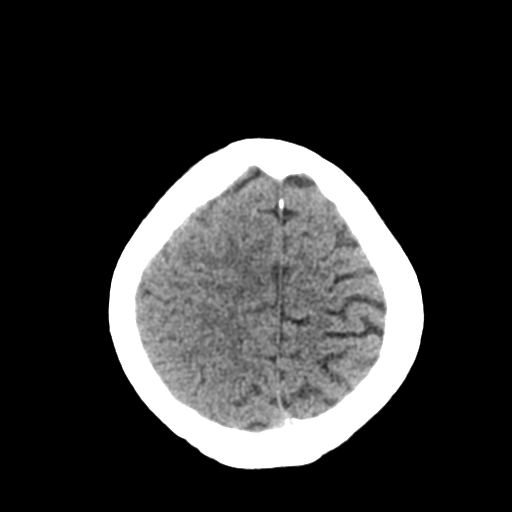
[im 24/29  brain]
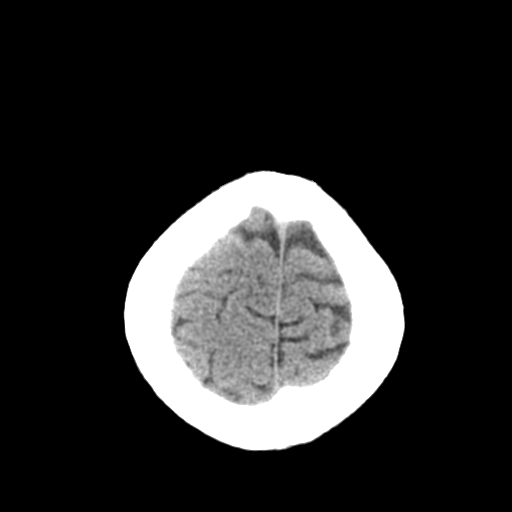
[im 24/29  bone]
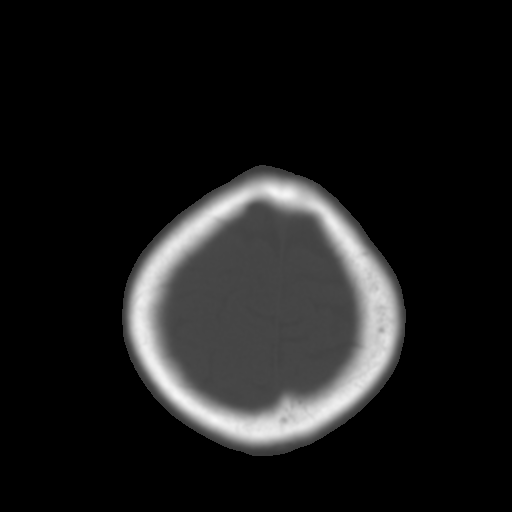
[im 27/29  brain]
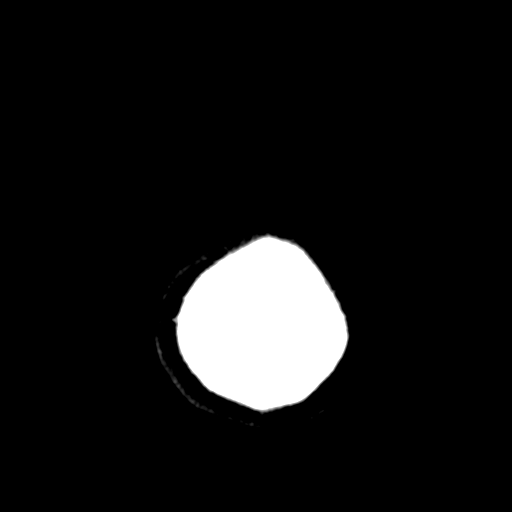

[Series 5: coronal soft tissue · coronal · 0.29mm/px · 3 of 59 slices shown]
[im 20/59  brain]
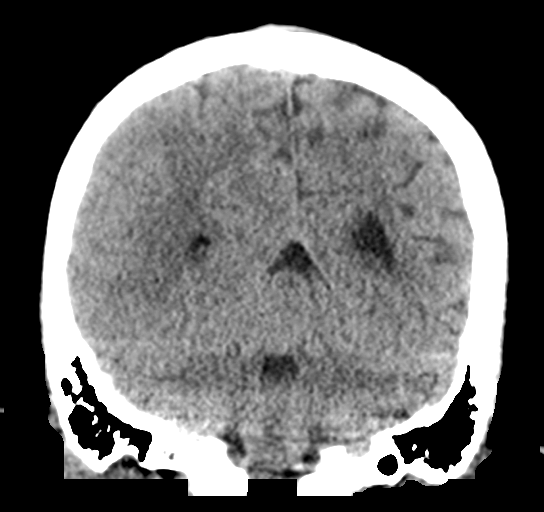
[im 26/59  brain]
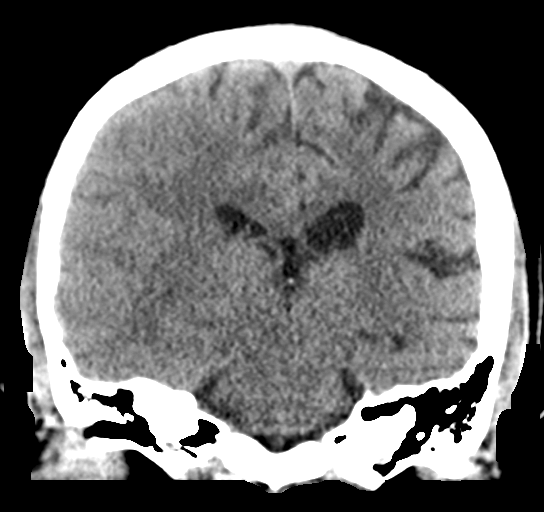
[im 33/59  brain]
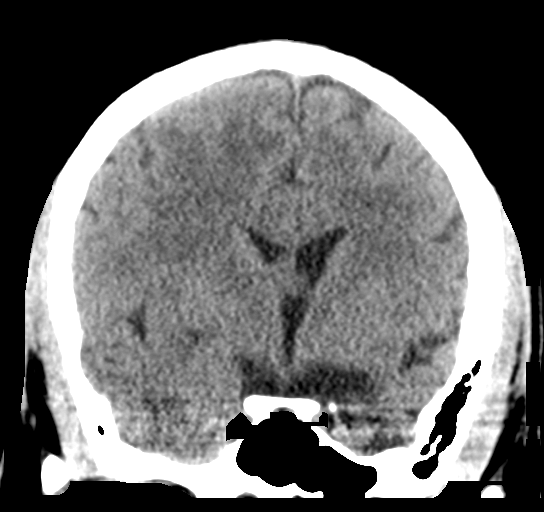

[Series 6: sagittal soft tissue · sagittal · 0.29mm/px · 3 of 52 slices shown]
[im 18/52  brain]
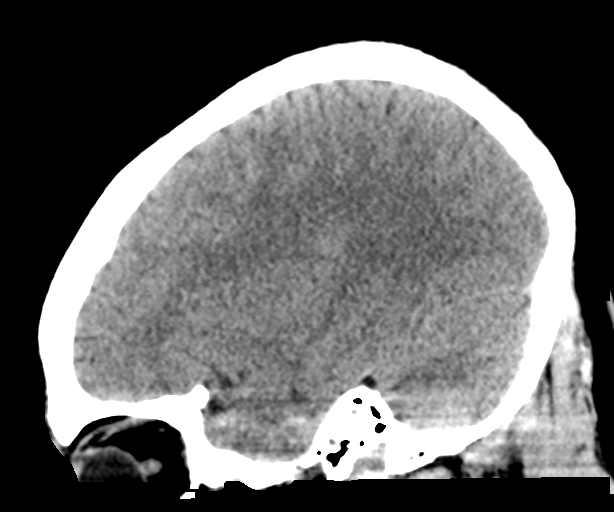
[im 26/52  brain]
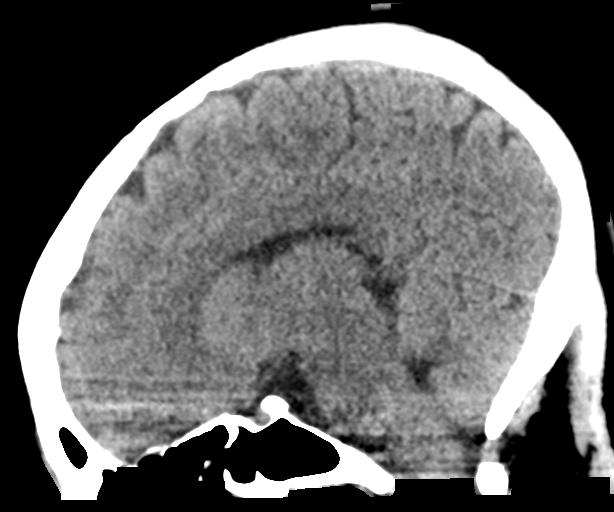
[im 35/52  brain]
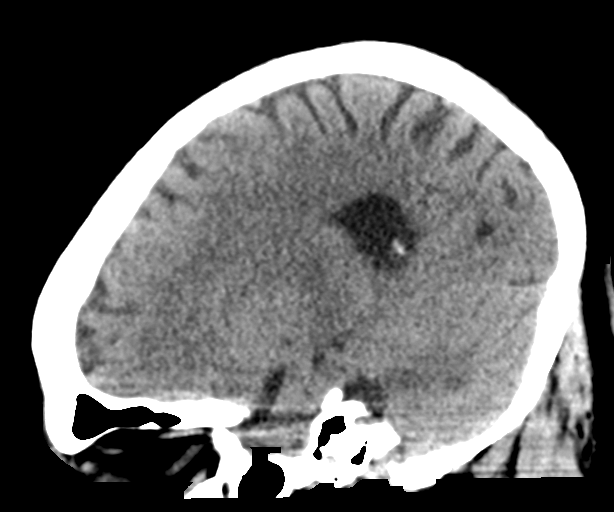

[16 of 46 positions shown; findings below may reference images not displayed]

FINDINGS: Brain: No evidence of acute infarction, hemorrhage, hydrocephalus,
extra-axial collection or mass lesion/mass effect. Chronic asymmetry
of the lateral ventricles is unchanged. Left slightly [place] than
right. No hydrocephalus.

Vascular: No hyperdense vessel or unexpected calcification.

Skull: Normal. Negative for fracture or focal lesion.

Sinuses/Orbits: Slight increase in opacification of left frontal and
ethmoid sinuses since the previous study. Visualized portions of the
orbits are normal.

Other: None.
IMPRESSION: 1. No acute intracranial abnormality.
2. Slight increase in opacification of left frontal and ethmoid
sinuses since the previous study.

## 2020-09-27 ENCOUNTER — Telehealth: Payer: Self-pay | Admitting: Pediatric Intensive Care

## 2020-09-27 NOTE — Telephone Encounter (Signed)
Email reminder to Sander Radon (guardian) and Alvie Heidelberg (CWS case worker) that this CN needs to know status of family member accompanying client to neurology appointment on 10/02/20. [name] RN BSN CNP [phone]

## 2020-10-02 ENCOUNTER — Ambulatory Visit: Payer: Medicaid Other | Admitting: Neurology

## 2020-10-16 NOTE — Congregational Nurse Program (Signed)
  Dept: [phone]   Congregational Nurse Program Note  Date of Encounter: 10/15/20  Past Medical History: Past Medical History:  Diagnosis Date   Bipolar 1 disorder (HCC)    Cerebral palsy (HCC)    Hypertension    Seizures (HCC)    Suicidal behavior     Encounter Details: \ Home visit completed 10/15/20 at 1700 hrs. Patient mother and sister in law present during the visit. They report that patient has been doing well and is compliant with medication.  Medication inspected. She has enough medication until next refill.She is currently taking   1.Keppra 1500mg  twice a day 2.Lamotrigine 50mg  twice a day 3.Risperidone 2mg  twice a day  I will continue to offer assistance as needed.  RN BSn PCCN  [facility] [phone]-cell [phone]-office

## 2020-10-24 NOTE — Telephone Encounter (Signed)
Message acknowledged and reviewed by provider. Patient is continuing to take oral medication. Will discuss injection options during follow up.

## 2020-10-25 ENCOUNTER — Encounter (HOSPITAL_COMMUNITY): Payer: Self-pay | Admitting: Physician Assistant

## 2020-10-25 ENCOUNTER — Ambulatory Visit (INDEPENDENT_AMBULATORY_CARE_PROVIDER_SITE_OTHER): Payer: Medicaid Other | Admitting: Physician Assistant

## 2020-10-25 ENCOUNTER — Telehealth: Payer: Self-pay

## 2020-10-25 ENCOUNTER — Other Ambulatory Visit: Payer: Self-pay

## 2020-10-25 VITALS — BP 102/60 | Ht 61.0 in | Wt 199.0 lb

## 2020-10-25 DIAGNOSIS — F0281 Dementia in other diseases classified elsewhere with behavioral disturbance: Secondary | ICD-10-CM | POA: Diagnosis not present

## 2020-10-25 DIAGNOSIS — F3160 Bipolar disorder, current episode mixed, unspecified: Secondary | ICD-10-CM | POA: Diagnosis not present

## 2020-10-25 DIAGNOSIS — F02B18 Dementia in other diseases classified elsewhere, moderate, with other behavioral disturbance: Secondary | ICD-10-CM | POA: Insufficient documentation

## 2020-10-25 DIAGNOSIS — F4325 Adjustment disorder with mixed disturbance of emotions and conduct: Secondary | ICD-10-CM

## 2020-10-25 MED ORDER — LAMOTRIGINE 25 MG PO TABS: 50.0000 mg | ORAL_TABLET | Freq: Two times a day (BID) | ORAL | 3 refills | Status: DC

## 2020-10-25 MED ORDER — RISPERIDONE 2 MG PO TBDP: 2.0000 mg | ORAL_TABLET | Freq: Two times a day (BID) | ORAL | 3 refills | Status: DC

## 2020-10-25 NOTE — Telephone Encounter (Signed)
Patient mother contacted and reminded of today`s appointment at 2 pm. Informed that transportation will be provided by [employer]. Patient`s mother will accompany patient. All questions answered.  Arman Bogus RN BSn PCCN  [facility] [phone]-cell [phone]-office

## 2020-10-25 NOTE — Progress Notes (Signed)
BH MD/PA/NP OP Progress Note  10/25/2020 11:27 PM Patricia [name]  MRN:  [id number]  Chief Complaint:  Chief Complaint   Medication Management   Follow up and medication management  HPI:     Visit Diagnosis:    ICD-10-CM   1. 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 Adjustment disorder with mixed disturbance of emotions and conduct  F43.25 risperiDONE (RISPERDAL M-TABS) 2 MG disintegrating tablet    lamoTRIgine (LAMICTAL) 25 MG tablet    3. Bipolar affective disorder, current episode mixed, current episode severity unspecified (HCC)  F31.60 risperiDONE (RISPERDAL M-TABS) 2 MG disintegrating tablet    lamoTRIgine (LAMICTAL) 25 MG tablet      Past Psychiatric History:  Mixed bipolar affective disorder Major neurocognitive disorder Adjustment disorder with mixed disturbance of emotions and conduct  Past Medical History:  Past Medical History:  Diagnosis Date   Bipolar 1 disorder (HCC)    Cerebral palsy (HCC)    Hypertension    Seizures (HCC)    Suicidal behavior    History reviewed. No pertinent surgical history.  Family Psychiatric History:  Per patient's sister, they have a younger sister with a disability but it is not psychiatric in nature  Family History:  Family History  Problem Relation Age of Onset   Hepatitis Brother     Social History:  Social History   Socioeconomic History   Marital status: Single    Spouse name: Not on file   Number of children: 1   Years of education: Not on file   Highest education level: Not on file  Occupational History    Comment: none  Tobacco Use   Smoking status: Never   Smokeless tobacco: Never  Vaping Use   Vaping Use: Unknown  Substance and Sexual Activity   Alcohol use: Yes    Alcohol/week: 2.0 standard drinks    Types: 2 Cans of beer per week    Comment: occasional alcohol use   Drug use: Never   Sexual activity: Not Currently   Other Topics Concern   Not on file  Social History Narrative   ** Merged History Encounter **        ** Merged History Encounter **       ** Merged History Encounter **       Lives with mother, family   Social Determinants of Health   Financial Resource Strain: Not on file  Food Insecurity: Not on file  Transportation Needs: Not on file  Physical Activity: Not on file  Stress: Not on file  Social Connections: Not on file    Allergies: Not on File  Metabolic Disorder Labs: Lab Results  Component Value Date   HGBA1C 4.9 07/11/2019   MPG 105.41 06/30/2019   No results found for: PROLACTIN Lab Results  Component Value Date   CHOL 129 06/30/2019   TRIG 55 06/30/2019   HDL 38 (L) 06/30/2019   CHOLHDL 3.4 06/30/2019   VLDL 11 06/30/2019   LDLCALC 80 06/30/2019   Lab Results  Component Value Date   TSH 1.312 06/30/2019   TSH 1.780 08/04/2017    Therapeutic Level Labs: No results found for: LITHIUM No results found for: VALPROATE No components found for:  CBMZ  Current Medications: Current Outpatient Medications  Medication Sig Dispense Refill   levETIRAcetam (KEPPRA) 500 MG tablet Take 3 tablets (1,500 mg total) by mouth 2 (two) times daily. 180 tablet 1  lamoTRIgine (LAMICTAL) 25 MG tablet Take 2 tablets (50 mg total) by mouth 2 (two) times daily. 120 tablet 3   risperiDONE (RISPERDAL M-TABS) 2 MG disintegrating tablet Take 1 tablet (2 mg total) by mouth 2 (two) times daily. 60 tablet 3   No current facility-administered medications for this visit.     Musculoskeletal: Strength & Muscle Tone: within normal limits Gait & Station: normal Patient leans: N/A  Psychiatric Specialty Exam: Review of Systems  Psychiatric/Behavioral:  Negative for decreased concentration, dysphoric mood, hallucinations, self-injury, sleep disturbance and suicidal ideas. The patient is not nervous/anxious and is not hyperactive.    Blood pressure 102/60, height 5\' 1"  (1.549 m),  weight 199 lb (90.3 kg).Body mass index is 37.6 kg/m.  General Appearance: Well Groomed  Eye Contact:  Fair  Speech:  Normal Rate  Volume:  Normal  Mood:  Euthymic  Affect:  Appropriate and Restricted  Thought Process:  Coherent and Descriptions of Associations: Intact  Orientation:  Full (Time, Place, and Person)  Thought Content: WDL   Suicidal Thoughts:  No  Homicidal Thoughts:  No  Memory:  Immediate;   Fair Recent;   Fair Remote;   Fair  Judgement:  Impaired  Insight:  Lacking  Psychomotor Activity:  Normal  Concentration:  Concentration: Good and Attention Span: Good  Recall:  Fair  Fund of Knowledge: Good  Language: NA  Akathisia:  NA  Handed:  Left  AIMS (if indicated): not done  Assets:  Social Support  ADL's:  Impaired  Cognition: Impaired,  Mild  Sleep:  Fair   Screenings: AIMS    Flowsheet Row [facility] ([facility]) [facility] ([facility]) [facility]  AIMS Total Score 0 0 0      AUDIT    Flowsheet Row Ad[facility]) [facility] Admission (Discharged) from [facility] Ad[facility] ([facility]) [facility]  Alcohol Use Disorder Identification Test Final Score (AUDIT) 0 0 0      PHQ2-9    Flowsheet Row Office Visit from 10/20/2019 in [facility] ED from 09/18/2019 in [facility]PART[facility] Office Visit from 07/11/2019 in [facility] Office Visit from 08/04/2017 in [facility] Office Visit from 07/14/2017 in [facility]  PHQ-2 Total Score 3 4 0 0 0  PHQ-9 Total Score 11 13 -- -- --       Flowsheet Row Office Visit from 07/19/2020 in [facility] Admission (Discharged) [facility] from [facility]12/2020 [facility]  C-SSRS RISK CATEGORY High Risk Error: Q3, 4, or 5 should not be populated when Q2 is No High Risk        Assessment and Plan:     1. Adjustment disorder with mixed disturbance of emotions and conduct  - risperiDONE (RISPERDAL M-TABS) 2 MG disintegrating tablet; Take 1 tablet (2 mg total) by mouth 2 (two) times daily.  Dispense: 60 tablet; Refill: 3 - lamoTRIgine (LAMICTAL) 25 MG tablet; Take 2 tablets (50 mg total) by mouth 2 (two) times daily.  Dispense: 120 tablet; Refill: 3  2. Bipolar affective disorder, current episode mixed, current episode severity unspecified (HCC)  -  risperiDONE (RISPERDAL M-TABS) 2 MG disintegrating tablet; Take 1 tablet (2 mg total) by mouth 2 (two) times daily.  Dispense: 60 tablet; Refill: 3 - lamoTRIgine (LAMICTAL) 25 MG tablet; Take 2 tablets (50 mg total) by mouth 2 (two) times daily.  Dispense: 120 tablet; Refill: 3  3. 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

## 2020-11-04 ENCOUNTER — Encounter: Payer: Self-pay | Admitting: Family Medicine

## 2020-11-04 ENCOUNTER — Other Ambulatory Visit: Payer: Self-pay | Admitting: Family Medicine

## 2020-11-04 DIAGNOSIS — Z8619 Personal history of other infectious and parasitic diseases: Secondary | ICD-10-CM

## 2020-11-04 NOTE — Progress Notes (Signed)
Follow up labs ordered for 9/12.  Patricia Starr, MD  Family Medicine Teaching Service

## 2020-11-05 ENCOUNTER — Other Ambulatory Visit: Payer: Self-pay

## 2020-11-05 ENCOUNTER — Ambulatory Visit (INDEPENDENT_AMBULATORY_CARE_PROVIDER_SITE_OTHER): Payer: Medicaid Other | Admitting: Family Medicine

## 2020-11-05 ENCOUNTER — Encounter: Payer: Self-pay | Admitting: Family Medicine

## 2020-11-05 VITALS — BP 101/71 | HR 97 | Ht 62.0 in | Wt 205.5 lb

## 2020-11-05 DIAGNOSIS — G40909 Epilepsy, unspecified, not intractable, without status epilepticus: Secondary | ICD-10-CM | POA: Diagnosis not present

## 2020-11-05 DIAGNOSIS — F4325 Adjustment disorder with mixed disturbance of emotions and conduct: Secondary | ICD-10-CM

## 2020-11-05 DIAGNOSIS — F819 Developmental disorder of scholastic skills, unspecified: Secondary | ICD-10-CM | POA: Diagnosis not present

## 2020-11-05 DIAGNOSIS — Z789 Other specified health status: Secondary | ICD-10-CM | POA: Diagnosis not present

## 2020-11-05 DIAGNOSIS — Z8619 Personal history of other infectious and parasitic diseases: Secondary | ICD-10-CM

## 2020-11-05 NOTE — Progress Notes (Signed)
Chief Complaint  Patient presents with   Follow-up    Room NCV/EMG 3. Pt here with mother and interpreter. (2 additional gaurdians in the lobby.) Patient has had about 2-3 seizures.     HISTORY OF PRESENT ILLNESS:  11/05/20 ALL: Clarice returns for follow up for seizures. She was last seen by Dr Anne Hahn 04/2020 and continued on levetiracetam 1500mg  BID and lamotrigine was added (had not received rx started with me in 02/2020). She was advised to titrate lamotrigine to 75mg  BID then call for new dose of 100mg  BID. She was seen in the ER 07/13/2020 for behavioral concerns and reportedly not compliant with Risperdal, levetiracetam and lamotrigine. Meds were restarted. She has continued levetiracetam 1500mg  BID and lamotrigine 50mg  daily (dose prescribed by psychiatry).   She presents with her mother and interpreter who aid in history. Since seeing Dr , she has had three seizures. All have been in the setting of missed AED doses for several days. Her mother is now overseeing administration of medications and she denies recently missed doses. Medications are pill packed and mailed to family via . Upon review of individual bubble pack, it is noted that she has three lamotrigine tablets for Thursday and only one on Friday.   She continues close follow up with , PA with psychiatry. Last seen 10/25/2020 and next appt 12/28/2020. She is also followed by Dr Sunday, PCP.  05/22/2020 KW: Ms. Erisman is a 30 year old left-handed black female with a history of seizures.  The patient has had poorly controlled seizures, in the month of February she had at least 4 seizures.  She has been on Keppra with an increase in the dose to 1500 mg twice daily.  Lamotrigine was recommended, but the patient apparently never got the prescription.  She will have episodes of behavior where she will try to run away from her home, she was seen in the emergency room on 14 May 2020 when she tried to run  away, the police were able to find her and they got her to the emergency room.  Blood work was done at that time.  The patient has not apparently had any increase in anxiety or agitation with the increase in the Keppra level according to the family.  This behavior has been present for years.  Her typical seizures are associated with dystonic posturing and spinning and then a generalized seizure.  The patient comes to the office today for further evaluation.  03/07/2020 ALL:  She returns today for seizure follow up. We increased levetiracetam to 1500mg  BID in 02/09/2020 (pateint most likely started increased dose 12/19 with congregational nurse visit) due to continued reports of seizures. Per last note from congregational nurse, she last had a seizure on 12/28.  Her sister-in-law presents with her today and states that patient's last seizure was actually yesterday, 03/06/2020.  She has not notified the congregational nurse of this event.  She reports that Tyniesha suddenly started clearing her throat/coughing then started to pull her hair with right hand. Head, neck and eyes were deviated to left. Lakhia was unable to communicate and then fell to the ground. Event lasted a few minutes and she was back to baseline within 5 minutes. She did have urinary incontinence but no tongue injury, no tonic clonic movements. Event was very similar to previous reports of seizure activity. Sister in law reports being present with medication administration and with the exception of 1 dose, she has taken (3) 500mg  tablets twice  daily since 12/19. She has had 2-3 seizures since increased dose.   She has had significant behavioral concerns. On 1/2, she ran out in the road at night without clothes. Per sister in law repots, she seems to have erratic/uncontrollable behavior periodically but consistently worse on the weekends when Lashe's mother is home. She is not followed by psychiatry. She is followed closely by Dr Manson Passey, PCP.    01/17/2020 ALL: Antoniette Peake is a 30 y.o. female here today for follow up for questionable breakthrough seizures. She presents with her brother and sister in law who aid in history with the assistance of interpreter. She was last seen in 09/2019 when she was reportedly doing well. No seizures at that time. Her brother reports that over the past two weeks she has had 3 seizures. With event, she will have urinary incontinence and then stare off into space. No tonic clonic movements but is not responsive. She has collapsed once. She has had some behavioral concerns as well with leaving the home without a supervisor. She has a caregiver 24 hours a day. She is working with mental health provider per her brother. She eats and drinks well but prefers mostly fruits and drinks soda and juice. Very little water.   10/10/2019 ALL: Liviya Santini is a 30 y.o. female here today for follow up. She presents today with her brother and sister in law who aid in history. She continues levetiracetam 500mg  twice daily. Family reports that she is doing well on this medication. They denies recent seizure activity. She is followed by Dr , PCP. Family reports that they have not yet received refill sent by Dr [name] on 8/12. Her brother will go by pharmacy today to inquire. Lajuan reports feeling well. No concerns. No pain. She is cognitively impaired but able to communicate with me today via interpretor.    Jocelyne, interpreter, assists with visit. Patient speaks Swahili.   REVIEW OF SYSTEMS: Out of a complete 14 system review of symptoms, the patient complains only of the following symptoms, seizures, behavioral concerns and all other reviewed systems are negative.   ALLERGIES: Not on File   HOME MEDICATIONS: Outpatient Medications Prior to Visit  Medication Sig Dispense Refill   lamoTRIgine (LAMICTAL) 25 MG tablet Take 2 tablets (50 mg total) by mouth 2 (two) times daily. 120 tablet 3   levETIRAcetam  (KEPPRA) 500 MG tablet Take 3 tablets (1,500 mg total) by mouth 2 (two) times daily. 180 tablet 1   risperiDONE (RISPERDAL M-TABS) 2 MG disintegrating tablet Take 1 tablet (2 mg total) by mouth 2 (two) times daily. 60 tablet 3   No facility-administered medications prior to visit.     PAST MEDICAL HISTORY: Past Medical History:  Diagnosis Date   Bipolar 1 disorder (HCC)    Cerebral palsy (HCC)    Seizures (HCC)    Suicidal behavior      PAST SURGICAL HISTORY: No past surgical history on file.   FAMILY HISTORY: Family History  Problem Relation Age of Onset   Hepatitis Brother      SOCIAL HISTORY: Social History   Socioeconomic History   Marital status: Single    Spouse name: Not on file   Number of children: 1   Years of education: Not on file   Highest education level: Not on file  Occupational History    Comment: none  Tobacco Use   Smoking status: Never   Smokeless tobacco: Never  Vaping Use   Vaping Use:  Unknown  Substance and Sexual Activity   Alcohol use: Yes    Alcohol/week: 2.0 standard drinks    Types: 2 Cans of beer per week    Comment: occasional alcohol use   Drug use: Never   Sexual activity: Not Currently  Other Topics Concern   Not on file  Social History Narrative   ** Merged History Encounter **        ** Merged History Encounter **       ** Merged History Encounter **       Lives with mother, family   Social Determinants of Health   Financial Resource Strain: Not on file  Food Insecurity: Not on file  Transportation Needs: Not on file  Physical Activity: Not on file  Stress: Not on file  Social Connections: Not on file  Intimate Partner Violence: Not on file      PHYSICAL EXAM  Vitals:   11/05/20 1334  BP: 101/71  Pulse: 97  Weight: 205 lb 8 oz (93.2 kg)  Height: 5\' 2"  (1.575 m)    Body mass index is 37.59 kg/m.   Generalized: Well developed, in no acute distress  Cardiology: normal rate and rhythm, no murmur  auscultated  Respiratory: clear to auscultation bilaterally    Neurological examination  Mentation: Alert, not oriented to time, place, or history taking. Follows most commands speech and language fluent, developmentally delayed, laughs though out visit Cranial nerve II-XII: Pupils were equal round reactive to light. Extraocular movements were full, visual field were full on confrontational test. Facial sensation and strength were normal. Head turning and shoulder shrug  were normal and symmetric. Motor: The motor testing reveals 5 over 5 strength of all 4 extremities. Good symmetric motor tone is noted throughout.  Sensory: Sensory testing is intact to soft touch on all 4 extremities. No evidence of extinction is noted.  Coordination: she is unable to follow instructions Gait and station: Gait is wide Reflexes: Deep tendon reflexes are symmetric and normal bilaterally.     DIAGNOSTIC DATA (LABS, IMAGING, TESTING) - I reviewed patient records, labs, notes, testing and imaging myself where available.  Lab Results  Component Value Date   WBC 5.4 07/12/2020   HGB 11.1 (L) 07/12/2020   HCT 35.0 (L) 07/12/2020   MCV 81.4 07/12/2020   PLT 212 07/12/2020      Component Value Date/Time   NA 137 07/12/2020 1643   NA 141 03/07/2020 0957   K 4.1 07/12/2020 1643   CL 113 (H) 07/12/2020 1643   CO2 17 (L) 07/12/2020 1643   GLUCOSE 83 07/12/2020 1643   BUN 23 (H) 07/12/2020 1643   BUN 9 03/07/2020 0957   CREATININE 0.92 07/12/2020 1643   CALCIUM 8.8 (L) 07/12/2020 1643   PROT 7.7 07/12/2020 1643   PROT 7.6 03/07/2020 0957   ALBUMIN 3.9 07/12/2020 1643   ALBUMIN 4.2 03/07/2020 0957   AST 20 07/12/2020 1643   ALT 17 07/12/2020 1643   ALKPHOS 48 07/12/2020 1643   BILITOT 0.6 07/12/2020 1643   BILITOT 0.3 03/07/2020 0957   GFRNONAA >60 07/12/2020 1643   GFRAA 102 03/07/2020 0957   Lab Results  Component Value Date   CHOL 129 06/30/2019   HDL 38 (L) 06/30/2019   LDLCALC 80 06/30/2019    TRIG 55 06/30/2019   CHOLHDL 3.4 06/30/2019   Lab Results  Component Value Date   HGBA1C 4.9 07/11/2019   No results found for: [id number] Lab Results  Component Value Date  TSH 1.312 06/30/2019      ASSESSMENT AND PLAN  31 y.o. year old female  has a past medical history of Bipolar 1 disorder (HCC), Cerebral palsy (HCC), Seizures (HCC), and Suicidal behavior. here with   Seizure disorder [facility])  Cognitive developmental delay  Adjustment disorder with mixed disturbance of emotions and conduct  Language barrier   Amylia has had 2-3 seizure-like events since last visit with Dr Anne Hahn 04/2020. There has been some confusion in lamotrigine dosing. She is taking 50mg  daily as prescribed by psychiatry. She also continues levetiracetam 1500mg  prescribed by our office. Mom reports no seizure activity when taking her medication consistently. We will continue current dosing for now as seizures seem well managed with consistent dosing. I will check drug levels, today. Last AED dosing 7am this morning. I have given mom detailed instructions for correct dose, number of tablets and color of each medication Mliss should be taking. I have also reached out to , congregational RN to help reiterate instructions in native language. I have updated PCP and psychiatry of treatment plan. I will have her follow up to establish care with Dr as Dr Arman Bogus is retiring. Mother verbalizes understanding and agreement with this plan.   Teresa Coombs, MSN, FNP-C 11/05/2020, 2:02 PM  [facility] [address], [address] [place], [address] [place] [phone]

## 2020-11-05 NOTE — Patient Instructions (Signed)
Below is our plan:  We will continue levetiracetam 1500mg  (three beige tablets) twice daily and lamotrigine 50mg  (two white tablets) every morning for seizure management. Psychiatry has her on Risperdal 2mg  (1 blue tablet) twice daily. Please continue close follow up with and Dr .   Please make sure you are staying well hydrated. I recommend 50-60 ounces daily. Well balanced diet and regular exercise encouraged. Consistent sleep schedule with 6-8 hours recommended.   Please continue follow up with care team as directed.   Follow up with Dr in 3 months.   You may receive a survey regarding today's visit. I encourage you to leave honest feed back as I do use this information to improve patient care. Thank you for seeing me today!

## 2020-11-05 NOTE — Congregational Nurse Program (Signed)
  Dept: [facility] Nurse Program Note  Date of Encounter: 11/05/2020  Past Medical History: Past Medical History:  Diagnosis Date   Bipolar 1 disorder ([place])    Cerebral palsy ([place])    Seizures ([place])    Suicidal behavior     Encounter Details:  Home visit completed. Met with patient and her mother. Patient had just woken up and looks calm and well rested. She voiced no complaints. Patient`s mother stated that her brothers are concerned about increased drowsiness associated with medication.They would like to hold medication for sometimes but patient`s mother is worried that patient may start eloping again.I have advised that its best to consult with the doctors and NOT to stop medication abruptly. I have reminded patient`s mother of appointment today with neurology at 2 pm and scheduled transportation with pick up time of 1 pm.   Honor Loh RN BSn [facility]  [facility] Nurse [phone]-cell [phone]-office

## 2020-11-05 NOTE — Progress Notes (Signed)
I have read the note, and I agree with the clinical assessment and plan.  Patricia [name] K Patricia [name]   

## 2020-11-06 ENCOUNTER — Encounter: Payer: Self-pay | Admitting: Family Medicine

## 2020-11-06 LAB — CBC
Hematocrit: 38.2 % (ref 34.0–46.6)
Hemoglobin: 12.1 g/dL (ref 11.1–15.9)
MCH: 25.6 pg — ABNORMAL LOW (ref 26.6–33.0)
MCHC: 31.7 g/dL (ref 31.5–35.7)
MCV: 81 fL (ref 79–97)
Platelets: 204 10*3/uL (ref 150–450)
RBC: 4.72 x10E6/uL (ref 3.77–5.28)
RDW: 13.4 % (ref 11.7–15.4)
WBC: 5.9 10*3/uL (ref 3.4–10.8)

## 2020-11-06 LAB — HCV INTERPRETATION

## 2020-11-06 LAB — HEPATITIS B SURFACE ANTIGEN: Hepatitis B Surface Ag: NEGATIVE

## 2020-11-06 LAB — HEPATITIS B CORE ANTIBODY, TOTAL: Hep B Core Total Ab: POSITIVE — AB

## 2020-11-06 LAB — HEPATITIS A ANTIBODY, TOTAL: hep A Total Ab: POSITIVE — AB

## 2020-11-06 LAB — HCV AB W REFLEX TO QUANT PCR: HCV Ab: <0.1 s/co ratio (ref 0.0–0.9)

## 2020-11-06 LAB — HEPATITIS B SURFACE ANTIBODY, QUANTITATIVE: Hepatitis B Surf Ab Quant: 788 m[IU]/mL (ref >9.9)

## 2020-11-07 NOTE — Congregational Nurse Program (Signed)
  Dept: [phone]   Congregational Nurse Program Note  Date of Encounter: 11/07/2020  Past Medical History: Past Medical History:  Diagnosis Date   Bipolar 1 disorder (HCC)    Cerebral palsy (HCC)    Seizures (HCC)    Suicidal behavior     Encounter Details:  CNP Questionnaire - 11/07/20 1701       Questionnaire   Do you give verbal consent to treat you today? Yes    Visit Setting Home    Location Patient Served At [facility]    Patient Status Refugee    Medical Provider Yes    Insurance Medicaid    Intervention Advocate;Assess (including screenings);Counsel;Educate;Spiritual Care;Support    Housing/Utilities Worried about losing housing    Transportation Need transportation assistance    Medication Have medication insecurities;Provided medication assistance [employer], drug rep, etc.)    ED Visit Averted Yes           Home visit completed. Patient is at home under the care of her mother and sister in law. Patient`s mother stated that she did not administer all morning medications due to increased sleepiness. On observation, Patient looked drowsy despite not taking morning medications.She is alert and oriented self and surrounding.I have advised the family that skipping medications altogether may lead to a seizure episode Family reports that she is unable to perform activities of daily living unless she is reminded and that she has delayed responses. Patient mother reports that she is unable to make it to the bathroom at times and has to uses a bedside commode when she is too sleepy. I have reassured the family that I will let the doctors know and get back with them.  Medication inspected and confirmed errors in pre packed doses for tomorrow (Thursday) and Saturday. This error was noted and communicated to me during last visit with neurology. I have corrected and educated the family. I  will also get in touch with pharmacy that pre packaged the doses.  Nicole Cella Lene Mckay RN BSn  PCCN  [facility] [phone]-cell [phone]-office

## 2020-11-08 ENCOUNTER — Telehealth: Payer: Self-pay | Admitting: Neurology

## 2020-11-08 LAB — LEVETIRACETAM LEVEL: Levetiracetam Lvl: 26.4 ug/mL (ref 10.0–40.0)

## 2020-11-08 LAB — LAMOTRIGINE LEVEL: Lamotrigine Lvl: <1 ug/mL — ABNORMAL LOW (ref 2.0–20.0)

## 2020-11-08 NOTE — Telephone Encounter (Signed)
Called the legal guardian who was listed on file as Herbert Seta and she advised that the patient has a new Child psychotherapist that is legal guardian of her this case.  His name is Caryn Bee and phone number is [phone].  She also provided me with the nurses name and number, Nicole Cella her number is [phone].  She requested I contact the nurse to review the labs.  I called [name] to review the labs and she had seen that already posted on my chart and was aware of the findings.  Advised that I did speak with the pharmacy and was informed the patient received all of her medication.  Confirmed the dosages of the medication with the nurse and how she is taking them.  Advised the nurse that if she is not having any seizure activity then we will continue to monitor but if she begins to have more seizures we may need to increase the lamotrigine.  She verbalized understanding and stated she will contact us if there were any more seizures

## 2020-11-08 NOTE — Telephone Encounter (Signed)
-----   Message from Shawnie Dapper, NP sent at 11/08/2020  8:02 AM EDT ----- Please let family know that her labs are back. Everything looks ok with the exception her lamotrigine level. Mom reported that she takes lamotrigine in the mornings and her last dose should have been around 8am the day of her visit. This drug was not detected in her system. We had found some discrepancies in pill packs from pharmacy. Can we please confirm with pharmacy that they are filling lamotrigine 50mg  daily as well as levetiracetam 1500mg  twice daily. As long as she is not having seizure activity, we will continue current treatment plan. If seizures continue and she is taking medications consistently, we will need to increase lamotrigine. TY!

## 2020-11-14 ENCOUNTER — Telehealth: Payer: Self-pay | Admitting: Family Medicine

## 2020-11-14 MED ORDER — RISPERIDONE 1 MG PO TABS: ORAL_TABLET | ORAL | 3 refills | Status: DC

## 2020-11-14 NOTE — Telephone Encounter (Signed)
Called patient's pharmacy.  I canceled the prior prescription of Risperdal.  I spoke with the pharmacist personally and will send in the new dose of her Risperdal 1 mg in the morning and 2 mg at night.  [name], MD  Family Medicine Teaching Service

## 2020-11-17 NOTE — Congregational Nurse Program (Signed)
  Dept: [phone]   Congregational Nurse Program Note  Date of Encounter:  11/15/20  Past Medical History: Past Medical History:  Diagnosis Date   Bipolar 1 disorder (HCC)    Cerebral palsy (HCC)    Seizures (HCC)    Suicidal behavior     Encounter Details:  CNP Questionnaire - 11/17/20 1643       Questionnaire   Do you give verbal consent to treat you today? Yes    Visit Setting Home    Location Patient Served At [facility]    Patient Status Refugee    Insurance Medicaid    Intervention Advocate;Assess (including screenings);Counsel;Educate;Refer;Support    Transportation Need transportation assistance    Food Have food insecurities    Medication Have medication insecurities;Provided medication assistance (Pharmacies, drug rep, etc.)    ED Visit Averted Yes            Visited patient at home. Family continues to express concerns regarding increased drowsiness. Family informed that the doctor has made some changes to medication regimen to decrease the drowsiness. Family educated /instructed to administer Risperidone 1 mg in the morning and 2 mg in the evening per the new prescription.Family verbalized understanding and to continue monitoring for drowsiness.Also discussed lab results with the family. Family informed that lamotrigine level was undetectable but will continue with the same dose per neurology team and report if patient experiences seizures.Both patient`s mother and sister in law verbalized understanding.  Nicole Cella Quetzally Callas RN BSn PCCN  [facility] Nurse [phone]-cell [phone]-office

## 2020-11-21 ENCOUNTER — Telehealth: Payer: Self-pay

## 2020-11-21 NOTE — Telephone Encounter (Signed)
Contacted family with dental appointment reminder. Appointment scheduled for Monday October 3rd.Legal guardian Caryn Bee Mbumina [phone] will accompany patient.  Nicole Cella Shanan Fitzpatrick RN BSn PCCN  [facility] [phone]-cell [phone]-office

## 2020-12-26 ENCOUNTER — Telehealth: Payer: Self-pay

## 2020-12-26 NOTE — Telephone Encounter (Signed)
Contacted patient mother with appointment reminder. She verbalized understanding of appointment date and time.  Nicole Cella Lan Mcneill RN BSn PCCN  [facility] [phone]-cell [phone]-office

## 2020-12-28 ENCOUNTER — Encounter (HOSPITAL_COMMUNITY): Payer: Medicaid Other | Admitting: Physician Assistant

## 2021-03-01 ENCOUNTER — Encounter (HOSPITAL_COMMUNITY): Payer: Medicaid Other | Admitting: Physician Assistant

## 2021-03-04 ENCOUNTER — Telehealth: Payer: Self-pay | Admitting: Family Medicine

## 2021-03-04 DIAGNOSIS — Z59819 Housing instability, housed unspecified: Secondary | ICD-10-CM

## 2021-03-04 NOTE — Telephone Encounter (Signed)
Received call from Arman Bogus, RN that family does not have heat in house.  Patient, her mother, her stepfather Seleta Rhymes) are all [facility] patients. Youngest son Theme park manager) is patient of Dr. Lonie Peak. Family has not had heat in home for many days. Although landlord reports fixed, it does not appear to be the case. Ailment was recently discharged. Ermina Karie Mainland has upcoming appointment.   Care Management team- can we look into resources for family ASAP?  Referral placed.  [name], MD  Family Medicine Teaching Service

## 2021-03-20 ENCOUNTER — Ambulatory Visit (INDEPENDENT_AMBULATORY_CARE_PROVIDER_SITE_OTHER): Payer: Medicaid Other | Admitting: Family Medicine

## 2021-03-20 ENCOUNTER — Ambulatory Visit (INDEPENDENT_AMBULATORY_CARE_PROVIDER_SITE_OTHER): Payer: Medicaid Other

## 2021-03-20 ENCOUNTER — Encounter: Payer: Self-pay | Admitting: Family Medicine

## 2021-03-20 ENCOUNTER — Other Ambulatory Visit: Payer: Self-pay

## 2021-03-20 VITALS — BP 102/69 | HR 78 | Ht 62.0 in | Wt 210.0 lb

## 2021-03-20 DIAGNOSIS — Z0000 Encounter for general adult medical examination without abnormal findings: Secondary | ICD-10-CM

## 2021-03-20 DIAGNOSIS — Z23 Encounter for immunization: Secondary | ICD-10-CM | POA: Diagnosis present

## 2021-03-20 DIAGNOSIS — R631 Polydipsia: Secondary | ICD-10-CM | POA: Diagnosis not present

## 2021-03-20 DIAGNOSIS — F39 Unspecified mood [affective] disorder: Secondary | ICD-10-CM

## 2021-03-20 DIAGNOSIS — N912 Amenorrhea, unspecified: Secondary | ICD-10-CM

## 2021-03-20 DIAGNOSIS — F3160 Bipolar disorder, current episode mixed, unspecified: Secondary | ICD-10-CM

## 2021-03-20 DIAGNOSIS — G40909 Epilepsy, unspecified, not intractable, without status epilepticus: Secondary | ICD-10-CM | POA: Diagnosis not present

## 2021-03-20 LAB — POCT GLYCOSYLATED HEMOGLOBIN (HGB A1C): Hemoglobin A1C: 5.6 % (ref 4.0–5.6)

## 2021-03-20 NOTE — Assessment & Plan Note (Signed)
We will call pharmacy to confirm they have refills on file.  Continue current doses.  Monitoring labs today.

## 2021-03-20 NOTE — Assessment & Plan Note (Signed)
Doing very well.  I do suspect her small amount of weight gain is related to her Risperdal.  I discussed this with the family.  Recommended continuing medication.

## 2021-03-20 NOTE — Progress Notes (Signed)
° ° °  SUBJECTIVE:   CHIEF COMPLAINT: Parental concern for diabetes, annual check up  HPI:   Patricia [name] is a 31 y.o.  with history notable for obesity, seizure disorder and cerebral palsy presenting for follow-up and concerns for diabetes.   The patient speaks Swahili as their primary language.  An interpreter was used for the entire visit.   Joined by Patricia [name], mom. Patricia [name] has no concerns. Medications are with mother today, reviewed and dosing correct.  Her caseworker Patricia [name] has brought her today.  The family still does not have electricity in the back of the house.  Per mom the landlord's try to get more money per month for the the housing.  Mom is thinking she would like to move.  Mom is worried about new onset DM. Reports Patricia [name] eats and drinks a lot of sugary things. Likes to dip oranges in straight sugar. No nausea, vomiting, abdominal pain.   Been reports she has no concerns today.  Mom denies side effects of her medications.  They report her seizures are doing well.  They bring with her all of her medications which are reviewed and appropriate today.   PERTINENT  PMH / PSH/Family/Social History : Updated and reviewed as appropriate.  OBJECTIVE:   BP 102/69    Pulse 78    Ht 5\' 2"  (1.575 m)    Wt 210 lb (95.3 kg)    SpO2 100%    BMI 38.41 kg/m   Today's weight:  Last Weight  Most recent update: 03/20/2021 10:13 AM    Weight  95.3 kg (210 lb)            Review of prior weights: Filed Weights   03/20/21 1013  Weight: 210 lb (95.3 kg)     Cardiac: Regular rate and rhythm. Normal S1/S2. No murmurs, rubs, or gallops appreciated. Lungs: Clear bilaterally to ascultation.  Abdomen: Normoactive bowel sounds. No tenderness to deep or light palpation. No rebound or guarding.    Psych: Pleasant and appropriate    ASSESSMENT/PLAN:   Seizure disorder (HCC) We will call pharmacy to confirm they have refills on file.  Continue current doses.  Monitoring labs today.  Episodic  mood disorder (HCC) Doing very well.  I do suspect her small amount of weight gain is related to her Risperdal.  I discussed this with the family.  Recommended continuing medication.    Change in appetite discussed with mother A1c today is not in diabetic range but just at the top of the prediabetic range.  We discussed reducing Patricia [name] juice.  She drinks quite a bit of juice and soda. Repeat in 6 months. Lipid panel today.   HCM Flu and COVID today    Annual Exam We discussed healthy eating habits, moderate physical activity for 30 minutes 5 times per week (or 150 minutes)--hard to do for her. Discussed Pap smear--recommend at follow up.   Reminder to self to call pharmacy.    03/22/21, MD  Family Medicine Teaching Service  [facility]

## 2021-03-20 NOTE — Patient Instructions (Addendum)
It was wonderful to see you today.  Please bring ALL of your medications with you to every visit.   Today we talked about:  - Eliminating Cary's juice and soda intake  [employer] does NOT have diabetes  I will call you with Patricia [name]'s labs   Angola juisi ya Jelesa na ulaji wa soda  Rexanna HANA kisukari  Nitakupigia na maabara za Alawna   Follow up in 6 months for a women's exam   Thank you for choosing [facility].   Please call [phone] with any questions about today's appointment.  Please be sure to schedule follow up at the front  desk before you leave today.   [name], MD  Family Medicine

## 2021-03-21 ENCOUNTER — Telehealth: Payer: Self-pay | Admitting: *Deleted

## 2021-03-21 LAB — LIPID PANEL
Chol/HDL Ratio: 2.7 ratio (ref 0.0–4.4)
Cholesterol, Total: 130 mg/dL (ref 100–199)
HDL: 48 mg/dL (ref >39)
LDL Chol Calc (NIH): 69 mg/dL (ref 0–99)
Triglycerides: 59 mg/dL (ref 0–149)
VLDL Cholesterol Cal: 13 mg/dL (ref 5–40)

## 2021-03-21 LAB — CBC
Hematocrit: 38.5 % (ref 34.0–46.6)
Hemoglobin: 12.5 g/dL (ref 11.1–15.9)
MCH: 25.5 pg — ABNORMAL LOW (ref 26.6–33.0)
MCHC: 32.5 g/dL (ref 31.5–35.7)
MCV: 78 fL — ABNORMAL LOW (ref 79–97)
Platelets: 234 10*3/uL (ref 150–450)
RBC: 4.91 x10E6/uL (ref 3.77–5.28)
RDW: 13.2 % (ref 11.7–15.4)
WBC: 4.5 10*3/uL (ref 3.4–10.8)

## 2021-03-21 LAB — COMPREHENSIVE METABOLIC PANEL
ALT: 17 IU/L (ref 0–32)
AST: 15 IU/L (ref 0–40)
Albumin/Globulin Ratio: 1.2 (ref 1.2–2.2)
Albumin: 4.2 g/dL (ref 3.9–5.0)
Alkaline Phosphatase: 72 IU/L (ref 44–121)
BUN/Creatinine Ratio: 8 — ABNORMAL LOW (ref 9–23)
BUN: 7 mg/dL (ref 6–20)
Bilirubin Total: <0.2 mg/dL (ref 0.0–1.2)
CO2: 21 mmol/L (ref 20–29)
Calcium: 9.2 mg/dL (ref 8.7–10.2)
Chloride: 106 mmol/L (ref 96–106)
Creatinine, Ser: 0.89 mg/dL (ref 0.57–1.00)
Globulin, Total: 3.4 g/dL (ref 1.5–4.5)
Glucose: 81 mg/dL (ref 70–99)
Potassium: 4.1 mmol/L (ref 3.5–5.2)
Sodium: 141 mmol/L (ref 134–144)
Total Protein: 7.6 g/dL (ref 6.0–8.5)
eGFR: 90 mL/min/{1.73_m2} (ref >59)

## 2021-03-21 LAB — HCG, SERUM, QUALITATIVE: hCG,Beta Subunit,Qual,Serum: NEGATIVE m[IU]/mL (ref <6)

## 2021-03-21 NOTE — Chronic Care Management (AMB) (Signed)
°  Care Management   Outreach Note  03/21/2021 Name: Umi Fruge MRN: [id number] DOB: 1991-02-04  Referred by: Martyn Malay, MD Reason for referral : Care Coordination (Initial outreach to schedule referral with BSW)   An unsuccessful telephone outreach was attempted today. The patient was referred to the case management team for assistance with care management and care coordination.   Follow Up Plan:  The care management team will reach out to the patient again over the next 7 days.  If patient returns call to provider office, please advise to call Neola* at [phone].*  [place] Management  Direct Dial: [phone]

## 2021-03-22 ENCOUNTER — Telehealth: Payer: Self-pay | Admitting: Family Medicine

## 2021-03-22 NOTE — Telephone Encounter (Signed)
The patient speaks Swahili as their primary language.  An interpreter was used for the entire visit.  Called and spoke with Nasende about labs. All questions answered.  [name], MD  Family Medicine Teaching Service

## 2021-03-23 ENCOUNTER — Telehealth: Payer: Self-pay

## 2021-03-23 NOTE — Telephone Encounter (Signed)
I have called and verified contact information for patient`s sister in law. Her mobile number updated on patient chart.  Nicole Cella Jolane Bankhead RN [facility] P[facility]N  [facility] [phone]-cell [phone]-office

## 2021-03-27 NOTE — Telephone Encounter (Signed)
Hi Dr. Manson Passey I have been able to make contact and scheduled with [facility].  Thank you  Gwenevere Ghazi  Care Guide, Embedded Care Coordination [facility] Management  Direct Dial: [phone]

## 2021-03-27 NOTE — Chronic Care Management (AMB) (Signed)
°  Care Management   Note  03/27/2021 Name: Patricia [name] MRN: [id number] DOB: 1990/08/16  Patricia [name] is a 31 y.o. year old female who is a primary care patient of Martyn Malay, MD. I reached out to [name] by phone today in response to a referral sent by [name]'s primary care provider.   [name]:  Care management services include personalized support from designated clinical staff supervised by her physician, including individualized plan of care and coordination with other care providers 24/7 contact phone numbers for assistance for urgent and routine care needs. The patient may stop care management services at any time by phone call to the office staff.  Patient agreed to services and verbal consent obtained.   Follow up plan: Telephone appointment with care management team member scheduled for:04/10/21  [place] Management  Direct Dial: [phone]

## 2021-03-29 ENCOUNTER — Other Ambulatory Visit (HOSPITAL_COMMUNITY): Payer: Self-pay | Admitting: Physician Assistant

## 2021-03-29 DIAGNOSIS — F3160 Bipolar disorder, current episode mixed, unspecified: Secondary | ICD-10-CM

## 2021-03-29 DIAGNOSIS — F4325 Adjustment disorder with mixed disturbance of emotions and conduct: Secondary | ICD-10-CM

## 2021-04-01 ENCOUNTER — Other Ambulatory Visit: Payer: Self-pay | Admitting: *Deleted

## 2021-04-01 MED ORDER — RISPERIDONE 1 MG PO TABS: ORAL_TABLET | ORAL | 3 refills | Status: DC

## 2021-04-01 MED ORDER — LEVETIRACETAM 500 MG PO TABS: 1500.0000 mg | ORAL_TABLET | Freq: Two times a day (BID) | ORAL | 1 refills | Status: DC

## 2021-04-10 ENCOUNTER — Telehealth: Payer: Medicaid Other

## 2021-04-10 ENCOUNTER — Telehealth: Payer: Self-pay | Admitting: Licensed Clinical Social Worker

## 2021-04-10 ENCOUNTER — Ambulatory Visit: Payer: Medicaid Other

## 2021-04-10 ENCOUNTER — Telehealth: Payer: Self-pay

## 2021-04-10 NOTE — Telephone Encounter (Signed)
°  Care Management   Follow Up Note   04/10/2021 Name: Patricia [name] MRN: [id number] DOB: Aug 14, 1990   Referred by: Westley Chandler, MD Reason for referral : No chief complaint on file.  SW use interpreter line [phone] Hessie Diener). SW and interpreter attempted patient sister in law as requested four times. Patient sister in law did not have a VM set up.  An unsuccessful telephone outreach was attempted today. The patient was referred to the case management team for assistance with care management and care coordination.   Follow Up Plan: The care management team will reach out to the patient again over the next 30 days.   SIGNATURE

## 2021-04-10 NOTE — Telephone Encounter (Signed)
Patient mother called with appointment reminder.  Earlie Server Katlynne Mckercher RN [facility] [place] [phone]

## 2021-04-11 ENCOUNTER — Other Ambulatory Visit: Payer: Self-pay

## 2021-04-11 ENCOUNTER — Ambulatory Visit (INDEPENDENT_AMBULATORY_CARE_PROVIDER_SITE_OTHER): Payer: Medicaid Other

## 2021-04-11 DIAGNOSIS — Z23 Encounter for immunization: Secondary | ICD-10-CM | POA: Diagnosis not present

## 2021-04-12 ENCOUNTER — Ambulatory Visit (INDEPENDENT_AMBULATORY_CARE_PROVIDER_SITE_OTHER): Payer: Medicaid Other | Admitting: Physician Assistant

## 2021-04-12 ENCOUNTER — Encounter (HOSPITAL_COMMUNITY): Payer: Self-pay | Admitting: Physician Assistant

## 2021-04-12 DIAGNOSIS — F3160 Bipolar disorder, current episode mixed, unspecified: Secondary | ICD-10-CM | POA: Diagnosis not present

## 2021-04-12 DIAGNOSIS — F4325 Adjustment disorder with mixed disturbance of emotions and conduct: Secondary | ICD-10-CM | POA: Diagnosis not present

## 2021-04-12 MED ORDER — LAMOTRIGINE 25 MG PO TABS: 50.0000 mg | ORAL_TABLET | Freq: Two times a day (BID) | ORAL | 2 refills | Status: DC

## 2021-04-12 MED ORDER — RISPERIDONE 1 MG PO TABS: ORAL_TABLET | ORAL | 3 refills | Status: DC
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
## 2021-04-26 ENCOUNTER — Telehealth: Payer: Self-pay | Admitting: Family Medicine

## 2021-04-26 ENCOUNTER — Telehealth: Payer: Self-pay | Admitting: Licensed Clinical Social Worker

## 2021-04-26 ENCOUNTER — Telehealth: Payer: Medicaid Other

## 2021-04-26 NOTE — Telephone Encounter (Signed)
?  Care Management  ? ?Follow Up Note ? ? ?04/26/2021 ?Name: Patricia [name] MRN: [id number] DOB: 1990-03-23 ? ? ?Referred by: Martyn Malay, MD ?Reason for referral : No chief complaint on file. ? ? ?A second unsuccessful telephone outreach was attempted today. The patient was referred to the case management team for assistance with care management and care coordination.  ? ?Follow Up Plan: The care management team will reach out to the patient again over the next 30 days.  ? ?Milus Height, BSW  ?Social Worker ?[facility]  ?[phone] ?  ?

## 2021-04-26 NOTE — Telephone Encounter (Signed)
Called and spoke with housing ([employer]) with medical concerns about eviction. ? ?Patricia Starr, MD  ?Family Medicine Teaching Service  ? ?

## 2021-05-20 ENCOUNTER — Other Ambulatory Visit: Payer: Self-pay

## 2021-05-20 MED ORDER — LEVETIRACETAM 500 MG PO TABS: 1500.0000 mg | ORAL_TABLET | Freq: Two times a day (BID) | ORAL | 3 refills | Status: DC

## 2021-05-22 ENCOUNTER — Ambulatory Visit: Payer: Medicaid Other | Admitting: Licensed Clinical Social Worker

## 2021-05-22 NOTE — Patient Instructions (Signed)
Visit Information ? ?Instructions: patient will work with SW to address concerns related to housing ? ?Patient was given the following information about care management and care coordination services today, agreed to services, and gave verbal consent: 1.care management/care coordination services include personalized support from designated clinical staff supervised by their physician, including individualized plan of care and coordination with other care providers 2. 24/7 contact phone numbers for assistance for urgent and routine care needs. 3. The patient may stop care management/care coordination services at any time by phone call to the office staff. ? ?Patient verbalizes understanding of instructions and care plan provided today and agrees to view in MyChart. Active MyChart status confirmed with patient.   ? ? ?Christen Butter, BSW  ?Social Worker ?[facility] Management  ?[phone] ?  ? ?  ?

## 2021-05-22 NOTE — Chronic Care Management (AMB) (Signed)
?  Care Management  ? ?Social Work Visit Note ? ?05/22/2021 ?Name: Patricia [name] MRN: [id number] DOB: December 28, 1990 ? ?Patricia [name] is a 31 y.o. year old female who sees Westley Chandler, MD for primary care. The care management team was consulted for assistance with care management and care coordination needs related to Financial Difficulties related to housing.   ? ?Patient was given the following information about care management and care coordination services today, agreed to services, and gave verbal consent: 1.care management/care coordination services include personalized support from designated clinical staff supervised by their physician, including individualized plan of care and coordination with other care providers 2. 24/7 contact phone numbers for assistance for urgent and routine care needs. 3. The patient may stop care management/care coordination services at any time by phone call to the office staff. ? ?Engaged with patient by telephone for initial visit in response to provider referral for social work chronic care management and care coordination services. ? ?Assessment: Review of patient history, allergies, and health status during evaluation of patient need for care management/care coordination services.   ? ?Interventions:  ?Patient interviewed and appropriate assessments performed ?Collaborated with clinical team regarding patient needs  ?SW used [employer] administrator [id number]. Patient has secured a house. Patient was unable to inform SW of the address. Patient is needing assistance with $500 to move items from previous residence into new home. Patient needs assistance by 05/26/2021. Patient agreed to outreach her church. Patient denied family or friends. ?No other needs were discussed during call.  ? ?SDOH (Social Determinants of Health) assessments performed: Yes ?   ? ?Plan:  ?patient will work with BSW to address needs related to Housing barriers ?Social Worker will outreach team and agencies to  inquire on assistance with funds.  ? ?Christen Butter, BSW  ?Social Worker ?[facility] Management  ?[phone] ?  ? ? ? ? ? ? ? ? ? ? ? ? ? ? ?

## 2021-05-24 ENCOUNTER — Telehealth: Payer: Self-pay

## 2021-05-24 NOTE — Telephone Encounter (Signed)
Family has moved, address changed in Epic and pharmacy ? ?Mikalyn Hermida RN B[facility] P[facility]N  ?[facility] ?[phone]-cell ?[phone]-office ? ?

## 2021-05-24 NOTE — Telephone Encounter (Signed)
Legal Guardian Patricia [name] [phone] called and informed on address change. ? ?Honor Loh RN BSn PCCN  ?[facility] Nurse ?[phone]-cell ?[phone]-office ? ?

## 2021-06-07 ENCOUNTER — Encounter (HOSPITAL_COMMUNITY): Payer: Medicaid Other | Admitting: Physician Assistant

## 2021-06-20 ENCOUNTER — Encounter (HOSPITAL_COMMUNITY): Payer: Self-pay | Admitting: Physician Assistant

## 2021-06-20 ENCOUNTER — Ambulatory Visit (INDEPENDENT_AMBULATORY_CARE_PROVIDER_SITE_OTHER): Payer: Medicaid Other | Admitting: Physician Assistant

## 2021-06-20 DIAGNOSIS — F4325 Adjustment disorder with mixed disturbance of emotions and conduct: Secondary | ICD-10-CM

## 2021-06-20 DIAGNOSIS — F3160 Bipolar disorder, current episode mixed, unspecified: Secondary | ICD-10-CM

## 2021-06-20 MED ORDER — LAMOTRIGINE 25 MG PO TABS: 50.0000 mg | ORAL_TABLET | Freq: Two times a day (BID) | ORAL | 2 refills | Status: DC

## 2021-06-20 MED ORDER — RISPERIDONE 1 MG PO TABS: ORAL_TABLET | ORAL | 3 refills | Status: DC

## 2021-06-20 NOTE — Progress Notes (Signed)
? ? ?  SUBJECTIVE:  ? ?CHIEF COMPLAINT: rash  ?HPI:  ? ?Patricia [name] is a 31 y.o.  with history notable for seizure disorder, intellectual disability, and behavioral concerns  presenting for mouth ulcers.  ? ?The patient speaks Swahili as their primary language.  An interpreter was used for the entire visit. She is joined today by Caryn Bee (case worker). Also sister in law [name] is on phone . They report about 2 weeks ago they noticed some spots in whitening around patient's mouth.  No oral ulcers.  She is eating and drinking well.  No spots on her hands or feet.  No fevers chills or other symptoms.  She is compliant with her medications.  She does take Lamictal..  ? ? ?PERTINENT  PMH / PSH/Family/Social History : History of seizure disorder ? ?OBJECTIVE:  ? ?BP 94/66   Pulse 85   Wt 216 lb (98 kg)   SpO2 99%   BMI 41.49 kg/m?   ?Today's weight:  ?Last Weight  Most recent update: 06/21/2021 11:42 AM  ? ? Weight  ?98 kg (216 lb)  ?      ? ?  ? ?Review of prior weights: ?Filed Weights  ? 06/21/21 1142  ?Weight: 216 lb (98 kg)  ? ?Pleasant appearing woman in no distress.  Cardiac exam is regular rate and rhythm without murmur rubs or gallops.  Lungs are clear bilaterally although there is poor effort.  She is able to follow some commands with our Swahili interpreter. ? ?Oral examination is unremarkable.  She has poor dentition but there are no oral ulcers.  There is no mucous membrane involvement.  In the perioral region there are multiple papules and areas of hypopigmentation and irritation.  Several other areas on face including nasolabial fold and forehead with comedones. ?Bilateral hands and feet without lesion or ulcers  ? ? ?ASSESSMENT/PLAN:  ? ?Seizure disorder (HCC) ?Discussed with sister-in-law.  Patient doing well and compliant with medications. ?  ?Perioral dermatitis ?- stop Vaseline ?- Trial topical metronidazole to affected area ?- Did not use steroid due to hypopigmentation already present ?-I do not  suspect this is a primary reaction to Lamictal.  There is no hand or feet involvement which were closely inspected.  No oral ulcers.  This appears to be primarily localized to the perioral region and may be a reaction to saliva. ? ?Follow up in July--will discuss Pap with family.  ? ? ? ?[name], MD  ?Family Medicine Teaching Service  ?[facility]  ? ? ?

## 2021-06-21 ENCOUNTER — Other Ambulatory Visit: Payer: Self-pay

## 2021-06-21 ENCOUNTER — Encounter: Payer: Self-pay | Admitting: Family Medicine

## 2021-06-21 ENCOUNTER — Ambulatory Visit (INDEPENDENT_AMBULATORY_CARE_PROVIDER_SITE_OTHER): Payer: Medicaid Other | Admitting: Family Medicine

## 2021-06-21 VITALS — BP 94/66 | HR 85 | Wt 216.0 lb

## 2021-06-21 DIAGNOSIS — L710 Perioral dermatitis: Secondary | ICD-10-CM

## 2021-06-21 DIAGNOSIS — G40909 Epilepsy, unspecified, not intractable, without status epilepticus: Secondary | ICD-10-CM

## 2021-06-21 MED ORDER — METRONIDAZOLE 1 % EX GEL: Freq: Every day | CUTANEOUS | 0 refills | Status: DC

## 2021-06-21 NOTE — Assessment & Plan Note (Signed)
Discussed with sister-in-law.  Patient doing well and compliant with medications. ?

## 2021-06-21 NOTE — Patient Instructions (Addendum)
It was wonderful to see you today. ? ?Please bring ALL of your medications with you to every visit.  ? ?Today we talked about: ? ?--Stopping vaseline around the mouth ? ?- Use the gel (metrogel) I have sent to your pharmacy once a day  ? ?--Follow up in August or September for an annual exam  ? ? ?Thank you for choosing [facility].  ? ?Please call [phone] with any questions about today's appointment. ? ?Please be sure to schedule follow up at the front  desk before you leave today.  ? ?Dorris Singh, MD  ?Family Medicine  ? ?
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
## 2021-08-22 ENCOUNTER — Telehealth (HOSPITAL_COMMUNITY): Payer: Medicaid Other | Admitting: Psychiatry

## 2021-09-06 ENCOUNTER — Other Ambulatory Visit: Payer: Self-pay

## 2021-09-06 MED ORDER — LEVETIRACETAM 500 MG PO TABS: 1500.0000 mg | ORAL_TABLET | Freq: Two times a day (BID) | ORAL | 3 refills | Status: DC

## 2021-10-02 ENCOUNTER — Ambulatory Visit (INDEPENDENT_AMBULATORY_CARE_PROVIDER_SITE_OTHER): Payer: Medicaid Other | Admitting: Physician Assistant

## 2021-10-02 ENCOUNTER — Encounter (HOSPITAL_COMMUNITY): Payer: Self-pay | Admitting: Physician Assistant

## 2021-10-02 VITALS — BP 116/75 | HR 79 | Ht 60.05 in | Wt 218.0 lb

## 2021-10-02 DIAGNOSIS — F3160 Bipolar disorder, current episode mixed, unspecified: Secondary | ICD-10-CM

## 2021-10-02 DIAGNOSIS — F4325 Adjustment disorder with mixed disturbance of emotions and conduct: Secondary | ICD-10-CM

## 2021-10-03 ENCOUNTER — Encounter (HOSPITAL_COMMUNITY): Payer: Self-pay | Admitting: Physician Assistant

## 2021-10-03 ENCOUNTER — Telehealth: Payer: Self-pay

## 2021-10-03 MED ORDER — LAMOTRIGINE 25 MG PO TABS: 50.0000 mg | ORAL_TABLET | Freq: Two times a day (BID) | ORAL | 2 refills | Status: DC

## 2021-10-03 MED ORDER — RISPERIDONE 1 MG PO TABS: ORAL_TABLET | ORAL | 3 refills | Status: DC

## 2021-10-03 NOTE — Telephone Encounter (Signed)
Patient`s mother has called with concerns that patient has not received SSI check for the last 3 months.I have emailed patient`s legal guardian to follow up.  Nicole Cella Carle Fenech RN BSN PCCN  [facility] [phone]-cell [phone]-office
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
## 2021-10-23 ENCOUNTER — Encounter (HOSPITAL_COMMUNITY): Payer: Medicaid Other | Admitting: Physician Assistant

## 2021-11-01 ENCOUNTER — Encounter: Payer: Self-pay | Admitting: Family Medicine

## 2021-11-01 ENCOUNTER — Ambulatory Visit (INDEPENDENT_AMBULATORY_CARE_PROVIDER_SITE_OTHER): Payer: Medicaid Other | Admitting: Family Medicine

## 2021-11-01 VITALS — BP 112/80 | HR 81 | Wt 217.4 lb

## 2021-11-01 DIAGNOSIS — F3160 Bipolar disorder, current episode mixed, unspecified: Secondary | ICD-10-CM | POA: Diagnosis not present

## 2021-11-01 DIAGNOSIS — G40909 Epilepsy, unspecified, not intractable, without status epilepticus: Secondary | ICD-10-CM | POA: Diagnosis not present

## 2021-11-01 DIAGNOSIS — F4325 Adjustment disorder with mixed disturbance of emotions and conduct: Secondary | ICD-10-CM | POA: Diagnosis not present

## 2021-11-01 NOTE — Assessment & Plan Note (Signed)
Will call [facility] to confirm medications

## 2021-11-01 NOTE — Patient Instructions (Signed)
It was wonderful to see you today.  Please bring ALL of your medications with you to every visit.   Today we talked about:  -Getting blood work  -You MUST STOP sugar  If your sugar is high we switch medications   Follow up in 6 months for blood work  I will call your pharmacy about medications    Kupata kazi ya damu  -Lazima UACHE sukari  Ikiwa sukari yako iko juu, tunabadilisha dawa  [place] baada ya miezi 6 kwa kazi ya damu  Nitaita duka lako la dawa kuhusu dawa Please follow up in 6 months   Thank you for choosing [facility].   Please call [phone] with any questions about today's appointment.  Please be sure to schedule follow up at the front  desk before you leave today.   [name], MD  Family Medicine
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
## 2021-11-01 NOTE — Assessment & Plan Note (Signed)
Suspect risperdal may be contributing to intake of food Patient agreed to NO soda A1C, monitoring labs today including CBC and CMP Follow up in 6 months Consider transition to Seroquel if weight increases--weight has been stable

## 2021-11-02 LAB — COMPREHENSIVE METABOLIC PANEL
ALT: 14 IU/L (ref 0–32)
AST: 19 IU/L (ref 0–40)
Albumin/Globulin Ratio: 1.4 (ref 1.2–2.2)
Albumin: 4.3 g/dL (ref 4.0–5.0)
Alkaline Phosphatase: 69 IU/L (ref 44–121)
BUN/Creatinine Ratio: 9 (ref 9–23)
BUN: 8 mg/dL (ref 6–20)
Bilirubin Total: 0.3 mg/dL (ref 0.0–1.2)
CO2: 18 mmol/L — ABNORMAL LOW (ref 20–29)
Calcium: 9.3 mg/dL (ref 8.7–10.2)
Chloride: 106 mmol/L (ref 96–106)
Creatinine, Ser: 0.86 mg/dL (ref 0.57–1.00)
Globulin, Total: 3 g/dL (ref 1.5–4.5)
Glucose: 85 mg/dL (ref 70–99)
Potassium: 4.4 mmol/L (ref 3.5–5.2)
Sodium: 140 mmol/L (ref 134–144)
Total Protein: 7.3 g/dL (ref 6.0–8.5)
eGFR: 93 mL/min/{1.73_m2} (ref >59)

## 2021-11-02 LAB — CBC
Hematocrit: 37.6 % (ref 34.0–46.6)
Hemoglobin: 12 g/dL (ref 11.1–15.9)
MCH: 25.6 pg — ABNORMAL LOW (ref 26.6–33.0)
MCHC: 31.9 g/dL (ref 31.5–35.7)
MCV: 80 fL (ref 79–97)
Platelets: 186 10*3/uL (ref 150–450)
RBC: 4.68 x10E6/uL (ref 3.77–5.28)
RDW: 14.3 % (ref 11.7–15.4)
WBC: 5.2 10*3/uL (ref 3.4–10.8)

## 2021-11-02 LAB — HEMOGLOBIN A1C
Est. average glucose Bld gHb Est-mCnc: 111 mg/dL
Hgb A1c MFr Bld: 5.5 % (ref 4.8–5.6)

## 2021-11-02 LAB — FERRITIN: Ferritin: 85 ng/mL (ref 15–150)

## 2021-11-03 ENCOUNTER — Encounter: Payer: Self-pay | Admitting: Family Medicine

## 2021-11-04 ENCOUNTER — Telehealth: Payer: Self-pay | Admitting: Family Medicine

## 2021-11-04 NOTE — Telephone Encounter (Signed)
The patient speaks Swahili as their primary language.  An interpreter was used for the entire visit.  Attempted to call patient. Reached voicemail, left generic voicemail to call back.  If family calls back please let them know that all labs were normal. This is good news.  [name], MD  Family Medicine Teaching Service

## 2021-11-22 ENCOUNTER — Encounter (HOSPITAL_COMMUNITY): Payer: Self-pay

## 2021-12-04 ENCOUNTER — Encounter (HOSPITAL_COMMUNITY): Payer: Medicaid Other | Admitting: Physician Assistant

## 2021-12-11 ENCOUNTER — Encounter (HOSPITAL_COMMUNITY): Payer: Medicaid Other | Admitting: Student in an Organized Health Care Education/Training Program

## 2021-12-18 ENCOUNTER — Ambulatory Visit (INDEPENDENT_AMBULATORY_CARE_PROVIDER_SITE_OTHER): Payer: Medicaid Other | Admitting: Student in an Organized Health Care Education/Training Program

## 2021-12-18 ENCOUNTER — Encounter (HOSPITAL_COMMUNITY): Payer: Self-pay | Admitting: Student in an Organized Health Care Education/Training Program

## 2021-12-18 DIAGNOSIS — F3160 Bipolar disorder, current episode mixed, unspecified: Secondary | ICD-10-CM | POA: Diagnosis not present

## 2021-12-18 MED ORDER — LAMOTRIGINE 150 MG PO TABS: 150.0000 mg | ORAL_TABLET | Freq: Every day | ORAL | 2 refills | Status: DC

## 2021-12-18 MED ORDER — RISPERIDONE 1 MG PO TABS: ORAL_TABLET | ORAL | 3 refills | Status: DC

## 2021-12-18 NOTE — Progress Notes (Signed)
Patricia [name]  12/18/2021 6:13 PM Patricia [name]  MRN:  [id number]  Chief Complaint:  Chief Complaint  Patient presents with   Follow-up   HPI: Patricia [name] is a 31 yo patient with a past psychiatric history significant for adjustment disorder and bipolar disorder who presents to [facility], accompanied by her social worker Patricia [name]) and interpreter.  Patient reports compliance with the following medications:  Risperdal 1 mg in the a.m. and 2 mg nightly Lamictal 50 mg twice daily  Patient's guardian reports that patient's family does not endorse any recent history of running out to the streets.  Patient reports that she has been sleeping well but does endorse significant anhedonia.  Patient reports that she wants to go back to [place] and endorses that "is just better there" and is not able to give specifics on why [place] is better.  Patient reports that she still continues to have problems with the other siblings?/Cousins?  In the home.  Patient reports that they often make fun of her due to her condition.  Patient reports that when they make fun of her or when her mother says no-such as not allowing her to put somersaults in her food or not getting her clothes that she wants, she resorts to taking a knife motioning to harm herself.  Patient endorses that she appears to do this in front of her mother, and her mother always takes the knife from her.  Patient and provider discussed ways that patient can cope with her feelings that do not include motioning self-harm.  Patient reports that if she were to go walking, she begins to have thoughts of running into the street to end her life.  Patient reports that she is willing to try listening to music as a coping skill.  Patient reports that she feels helpless and has low energy and poor concentration.  Patient reports her appetite is fair.  Patient reports that she has SI and instances where  she essentially does not get her way.  Patient does not endorse auditory hallucinations or HI.  Patient does endorse continued visual hallucinations although she does not give specifics.  Patient reports that they are "scary" and occurred as recently as yesterday.  Discussion was had with patient's legal guardian, concerning possible outlets for patient to get her out of the home and interacting with others.  [place] was suggested by this provider and Education officer, museum endorsed that he will look for other options for patient.  Despite language barrier, patient would likely benefit from getting out of her home.   Patient has a Guardian from [place]: Patricia [name] [phone] ( work cell) , SW since 10/2021 Patricia [name] is the interpreter they have prior interactions as well.   Visit Diagnosis:    ICD-10-CM   1. 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  Family Psychiatric History: Per patient's sister, they have a younger sister with a disability but it is not psychiatric in nature  Family History:  Family History  Problem  Relation Age of Onset   Hepatitis Brother     Social History:  Social History   Socioeconomic History   Marital status: Single    Spouse name: Not on file   Number of children: 1   Years of education: Not on file   Highest education level: Not on file  Occupational History    Comment: none  Tobacco Use   Smoking status: Never   Smokeless tobacco: Never  Vaping Use   Vaping Use: Unknown  Substance and Sexual  Activity   Alcohol use: Yes    Alcohol/week: 2.0 standard drinks of alcohol    Types: 2 Cans of beer per week    Comment: occasional alcohol use   Drug use: Never   Sexual activity: Not Currently  Other Topics Concern   Not on file  Social History Narrative   ** Merged History Encounter **        ** Merged History Encounter **       ** Merged History Encounter **       Lives with mother, family   Social Determinants of Health   Financial Resource Strain: Not on file  Food Insecurity: Not on file  Transportation Needs: Not on file  Physical Activity: Not on file  Stress: Not on file  Social Connections: Not on file    Allergies: Not on File  Metabolic Disorder Labs: Lab Results  Component Value Date   HGBA1C 5.5 11/01/2021   MPG 105.41 06/30/2019   No results found for: "PROLACTIN" Lab Results  Component Value Date   CHOL 130 03/20/2021   TRIG 59 03/20/2021   HDL 48 03/20/2021   CHOLHDL 2.7 03/20/2021   VLDL 11 06/30/2019   LDLCALC 69 03/20/2021   [place] 80 06/30/2019   Lab Results  Component Value Date   TSH 1.312 06/30/2019   TSH 1.780 08/04/2017    Therapeutic Level Labs: No results found for: "LITHIUM" No results found for: "VALPROATE" No results found for: "CBMZ"  Current Medications: Current Outpatient Medications  Medication Sig Dispense Refill   lamoTRIgine (LAMICTAL) 150 MG tablet Take 1 tablet (150 mg total) by mouth daily. 30 tablet 2   levETIRAcetam (KEPPRA) 500 MG tablet Take 3 tablets (1,500 mg total) by mouth 2 (two) times daily. 180 tablet 3   risperiDONE (RISPERDAL) 1 MG tablet Take 1 tablet  in the morning and take 2 tablets in the evening each day 90 tablet 3   No current facility-administered medications for this visit.     Musculoskeletal: Strength & Muscle Tone: within normal limits Gait & Station: normal Patient leans: N/A  Psychiatric Specialty Exam: Review of Systems  Psychiatric/Behavioral:  Positive for dysphoric  mood, hallucinations and suicidal ideas. Negative for sleep disturbance.     Blood pressure 112/67, pulse 89, resp. rate 20, weight 219 lb (99.3 kg), SpO2 98 %.Body mass index is 42.7 kg/m.  General Appearance: Fairly Groomed  Eye Contact:  Fair  Speech:  Clear and Coherent  Volume:  Normal  Mood:  Hopeless  Affect:  Appropriate  Thought Process:  Linear  Orientation:  Full (Time, Place, and Person)  Thought Content: Illogical   Suicidal Thoughts:  Yes.  without intent/plan, patient does not endorse SI on assessment today  Homicidal Thoughts:  No  Memory:  Immediate;   Fair Recent;   Poor  Judgement:  Poor  Insight:  Lacking  Psychomotor Activity:  Normal  Concentration:  Concentration: Fair  Recall:  NA  Fund of  Knowledge: Poor  Language: Fair  Akathisia:  No  Handed: Patient cannot hold a pencil  AIMS (if indicated): not done  Assets:  Housing Social Support  ADL's:  Intact  Cognition: Impaired,  Moderate  Sleep:  Good   Screenings: AIMS    Flowsheet Row Admission (Discharged) from 07/13/2020 in [place] [facility] Admission (Discharged) from 07/13/2019 in [place] Admission (Discharged) from 06/29/2019 in [place] [facility]  AIMS Total Score 0 0 0      AUDIT    Flowsheet Row Admission (Discharged) from 07/13/2020 in [place] [facility] Admission (Discharged) from 07/13/2019 in [place] Admission (Discharged) from 06/29/2019 in [facility] [facility]  Alcohol Use Disorder Identification Test Final Score (AUDIT) 0 0 0      PHQ2-9    [place] Office Visit from 06/21/2021 in [place] Office Visit from 10/20/2019 in [facility] ED from 09/18/2019 in [place] Office Visit from 07/11/2019 in [place] Office Visit  from 08/04/2017 in [place]  PHQ-2 Total Score 0 3 4 0 0  PHQ-9 Total Score -- 11 13 -- --      [place] Office Visit from 10/02/2021 in [facility] Office Visit from 06/20/2021 in [facility] Office Visit from 04/12/2021 in [place] CATEGORY Moderate Risk Moderate Risk Moderate Risk        Assessment and Plan: Camilia Broussard is a 31 yo patient with a past psychiatric history significant for adjustment disorder and bipolar disorder.  Patient continues to struggle with the cultural differences despite having been here for at least 3 years.  Patient is not quite sure how long she has been in the [name] when asked by provider.  External outlets may help patient adjust.  In regards to patient's psychosis, patient does not appear to be responding to these visual hallucinations, what is more concerning is her impulsivity likely due to her poor coping skills and depressed mood.  We will adjust patient's Lamictal first and reconsider increasing Lamictal at next visit.  Bipolar disorder, current episode depressed And adjusting to cultural changes - Increase Lamictal to 150 mg daily - Continue Risperdal 1 mg daily and 2 mg nightly  Follow-up in approximately 1 month  Collaboration of Care: Collaboration of Care:   Patient/Guardian was advised Release of Information must be obtained prior to any record release in order to collaborate their care with an outside provider. Patient/Guardian was advised if they have not already done so to contact the registration department to sign all necessary forms in order for [name] to release information regarding their care.   Consent: Patient/Guardian gives verbal consent for treatment and assignment of benefits for services provided during this visit. Patient/Guardian expressed understanding and agreed to proceed.   PGY-3 Freida Busman,  MD 12/18/2021, 6:13 PM

## 2021-12-26 ENCOUNTER — Encounter: Payer: Self-pay | Admitting: Neurology

## 2021-12-26 ENCOUNTER — Ambulatory Visit (INDEPENDENT_AMBULATORY_CARE_PROVIDER_SITE_OTHER): Payer: Medicaid Other | Admitting: Neurology

## 2021-12-26 VITALS — BP 120/83 | HR 65 | Wt 218.0 lb

## 2021-12-26 DIAGNOSIS — F819 Developmental disorder of scholastic skills, unspecified: Secondary | ICD-10-CM | POA: Diagnosis not present

## 2021-12-26 DIAGNOSIS — G808 Other cerebral palsy: Secondary | ICD-10-CM | POA: Diagnosis not present

## 2021-12-26 DIAGNOSIS — G40909 Epilepsy, unspecified, not intractable, without status epilepticus: Secondary | ICD-10-CM | POA: Diagnosis not present

## 2021-12-26 MED ORDER — LAMOTRIGINE 150 MG PO TABS: 150.0000 mg | ORAL_TABLET | Freq: Two times a day (BID) | ORAL | 11 refills | Status: DC

## 2021-12-26 NOTE — Progress Notes (Signed)
Chief Complaint  Patient presents with   Seizures    Rm 12, TOC Patricia Patricia [name], guardian - Patricia [name] interpreter- Patricia [name] "falling recently so behavioral health Patricia Dunnings MD advised she be seen"     HISTORY OF PRESENT ILLNESS:  12/27/21  Patient presents today for follow-up, she is accompanied by her guardian Patricia [name] and Production designer, theatre/television/film.  She is on Lamictal 150 mg daily, Keppra 1500 mg twice daily for his seizures.  Patient has been reporting lately that she has been falling, with no cause.  These falls happened when she is sitting down.  She cannot think of anything that brings them on.  I spoke with her mother over the phone and she has reported the fall while sitting down.  She reports patient had one fall.  She is compliant with her medications.  Denies any generalized tonic-clonic activity.    11/05/20 ALL: Patricia [name] returns for follow up for seizures. She was last seen by Patricia Patricia [name] 04/2020 and continued on levetiracetam 1500mg  BID and lamotrigine was added (had not received rx started with me in 02/2020). She was advised to titrate lamotrigine to 75mg  BID then call for new dose of 100mg  BID. She was seen in the ER 07/13/2020 for behavioral concerns and reportedly not compliant with Risperdal, levetiracetam and lamotrigine. Meds were restarted. She has continued levetiracetam 1500mg  BID and lamotrigine 50mg  daily (dose prescribed by psychiatry).   She presents with her mother and interpreter who aid in history. Since seeing Patricia Patricia [name], she has had three seizures. All have been in the setting of missed AED doses for several days. Her mother is now overseeing administration of medications and she denies recently missed doses. Medications are pill packed and mailed to family via [employer]. Upon review of individual bubble pack, it is noted that she has three lamotrigine tablets for Thursday and only one on Friday.   She continues close follow up with Patricia Musa, PA with psychiatry. Last seen  10/25/2020 and next appt 12/28/2020. She is also followed by Patricia Patricia [name], PCP.  05/22/2020 Patricia [name]: Patricia [name] is a 31 year old left-handed black female with a history of seizures.  The patient has had poorly controlled seizures, in the month of February she had at least 4 seizures.  She has been on Keppra with an increase in the dose to 1500 mg twice daily.  Lamotrigine was recommended, but the patient apparently never got the prescription.  She will have episodes of behavior where she will try to run away from her home, she was seen in the emergency room on 14 May 2020 when she tried to run away, the police were able to find her and they got her to the emergency room.  Blood work was done at that time.  The patient has not apparently had any increase in anxiety or agitation with the increase in the Keppra level according to the family.  This behavior has been present for years.  Her typical seizures are associated with dystonic posturing and spinning and then a generalized seizure.  The patient comes to the office today for further evaluation.  03/07/2020 ALL:  She returns today for seizure follow up. We increased levetiracetam to 1500mg  BID in 02/09/2020 (pateint most likely started increased dose 12/19 with congregational nurse visit) due to continued reports of seizures. Per last note from congregational nurse, she last had a seizure on 12/28.  Her sister-in-law presents with her today and states that patient's last seizure was actually yesterday, 03/06/2020.  She has not notified the congregational nurse of this event.  She reports that Patricia [name] suddenly started clearing her throat/coughing then started to pull her hair with right hand. Head, neck and eyes were deviated to left. Patricia [name] was unable to communicate and then fell to the ground. Event lasted a few minutes and she was back to baseline within 5 minutes. She did have urinary incontinence but no tongue injury, no tonic clonic movements. Event was very similar to  previous reports of seizure activity. Sister in law reports being present with medication administration and with the exception of 1 dose, she has taken (3) 500mg  tablets twice daily since 12/19. She has had 2-3 seizures since increased dose.   She has had significant behavioral concerns. On 1/2, she ran out in the road at night without clothes. Per sister in law repots, she seems to have erratic/uncontrollable behavior periodically but consistently worse on the weekends when Patricia [name]'s mother is home. She is not followed by psychiatry. She is followed closely by Patricia Patricia [name], PCP.   01/17/2020 ALL: Patricia [name] is a 31 y.o. female here today for follow up for questionable breakthrough seizures. She presents with her brother and sister in law who aid in history with the assistance of interpreter. She was last seen in 09/2019 when she was reportedly doing well. No seizures at that time. Her brother reports that over the past two weeks she has had 3 seizures. With event, she will have urinary incontinence and then stare off into space. No tonic clonic movements but is not responsive. She has collapsed once. She has had some behavioral concerns as well with leaving the home without a supervisor. She has a caregiver 24 hours a day. She is working with mental health provider per her brother. She eats and drinks well but prefers mostly fruits and drinks soda and juice. Very little water.   10/10/2019 ALL: Patricia [name] is a 30 y.o. female here today for follow up. She presents today with her brother and sister in law who aid in history. She continues levetiracetam 500mg  twice daily. Family reports that she is doing well on this medication. They denies recent seizure activity. She is followed by Patricia [name], PCP. Family reports that they have not yet received refill sent by Patricia Patricia [name] on 8/12. Her brother will go by pharmacy today to inquire. Patricia [name] reports feeling well. No concerns. No pain. She is cognitively  impaired but able to communicate with me today via interpretor.    Patricia [name], interpreter, assists with visit. Patient speaks Swahili.   REVIEW OF SYSTEMS: Out of a complete 14 system review of symptoms, the patient complains only of the following symptoms, seizures, behavioral concerns and all other reviewed systems are negative.   ALLERGIES: No Known Allergies   HOME MEDICATIONS: Outpatient Medications Prior to Visit  Medication Sig Dispense Refill   levETIRAcetam (KEPPRA) 500 MG tablet Take 3 tablets (1,500 mg total) by mouth 2 (two) times daily. 180 tablet 3   risperiDONE (RISPERDAL) 1 MG tablet Take 1 tablet  in the morning and take 2 tablets in the evening each day 90 tablet 3   lamoTRIgine (LAMICTAL) 150 MG tablet Take 1 tablet (150 mg total) by mouth daily. 30 tablet 2   No facility-administered medications prior to visit.     PAST MEDICAL HISTORY: Past Medical History:  Diagnosis Date   Bipolar 1 disorder ([place])    Cerebral palsy ([place])    Seizures ([place])    Suicidal  behavior      PAST SURGICAL HISTORY: History reviewed. No pertinent surgical history.   FAMILY HISTORY: Family History  Problem Relation Age of Onset   Hepatitis Brother      SOCIAL HISTORY: Social History   Socioeconomic History   Marital status: Single    Spouse name: Not on file   Number of children: 1   Years of education: Not on file   Highest education level: Not on file  Occupational History    Comment: none  Tobacco Use   Smoking status: Never   Smokeless tobacco: Never  Vaping Use   Vaping Use: Never used  Substance and Sexual Activity   Alcohol use: Not Currently    Alcohol/week: 2.0 standard drinks of alcohol    Types: 2 Cans of beer per week    Comment: occasional alcohol use   Drug use: Never   Sexual activity: Not Currently  Other Topics Concern   Not on file  Social History Narrative   Lives with mother, family   Social Determinants of Health   Financial  Resource Strain: Not on file  Food Insecurity: Not on file  Transportation Needs: Not on file  Physical Activity: Not on file  Stress: Not on file  Social Connections: Not on file  Intimate Partner Violence: Not on file      PHYSICAL EXAM  Vitals:   12/26/21 1418  BP: 120/83  Pulse: 65  Weight: 218 lb (98.9 kg)     Body mass index is 42.5 kg/m.   Generalized: Well developed, in no acute distress  Respiratory: normal breathing rate   Neurological examination  Mentation: Alert, not oriented to time, place, but was able to provide history of falls. Follows most commands speech and language fluent, developmentally delayed Cranial nerve II-XII: Pupils were equal round reactive to light. Extraocular movements were full, visual field were full on confrontational test. Facial sensation and strength were normal. Head turning and shoulder shrug  were normal and symmetric. Motor: The motor testing reveals 5 over 5 strength of all 4 extremities. Good symmetric motor tone is noted throughout.  Sensory: Sensory testing is intact to soft touch on all 4 extremities. No evidence of extinction is noted.  Coordination: Normal finger nose finger Gait and station: Gait is wide Reflexes: Deep tendon reflexes are symmetric and normal bilaterally.     DIAGNOSTIC DATA (LABS, IMAGING, TESTING) - I reviewed patient records, labs, notes, testing and imaging myself where available.  Lab Results  Component Value Date   WBC 5.2 11/01/2021   HGB 12.0 11/01/2021   HCT 37.6 11/01/2021   MCV 80 11/01/2021   PLT 186 11/01/2021      Component Value Date/Time   NA 140 11/01/2021 1228   K 4.4 11/01/2021 1228   CL 106 11/01/2021 1228   CO2 18 (L) 11/01/2021 1228   GLUCOSE 85 11/01/2021 1228   GLUCOSE 83 07/12/2020 1643   BUN 8 11/01/2021 1228   CREATININE 0.86 11/01/2021 1228   CALCIUM 9.3 11/01/2021 1228   PROT 7.3 11/01/2021 1228   ALBUMIN 4.3 11/01/2021 1228   AST 19 11/01/2021 1228   ALT  14 11/01/2021 1228   ALKPHOS 69 11/01/2021 1228   BILITOT 0.3 11/01/2021 1228   GFRNONAA >60 07/12/2020 1643   GFRAA 102 03/07/2020 0957   Lab Results  Component Value Date   CHOL 130 03/20/2021   HDL 48 03/20/2021   LDLCALC 69 03/20/2021   TRIG 59 03/20/2021   CHOLHDL 2.7  03/20/2021   Lab Results  Component Value Date   HGBA1C 5.5 11/01/2021   No results found for: "VITAMINB12" Lab Results  Component Value Date   TSH 1.312 06/30/2019   CT Head 06/03/19 1. No acute intracranial abnormality. 2. Slight increase in opacification of left frontal and ethmoid sinuses since the previous study.   ASSESSMENT AND PLAN  31 y.o. year old female  has a past medical history of Bipolar 1 disorder ([place]), Cerebral palsy ([place]), Seizures ([place]), and Suicidal behavior. here with    Seizure disorder ([place]) - Plan: EEG adult  Cognitive developmental delay  Other cerebral palsy ([place])    They are not reporting any frank seizures but there is also of unexplained falls, and this occurred when patient is sitting down.  Unsure if she is having seizures or not.  She is tolerating the lamotrigine well, will increase it to 150 twice daily.  She is continuing on Keppra 1500 mg twice daily.  I will also obtain a routine EEG, follow-up in 22-months.   Patient Instructions  Increase Lamictal to 150 mg twice daily  Continue with Keppra 1500 mg twice daily  Continue your other medications  Routine EEG  Follow up in 6 months with Amy.   I have spent a total of 30 minutes dedicated to this patient today, preparing to see patient, performing a medically appropriate examination and evaluation, ordering tests and/or medications and procedures, and counseling and educating the patient/family/caregiver; independently interpreting result and communicating results to the family/patient/caregiver; and documenting clinical information in the electronic medical record.   Alric Ran, MD  12/27/2021, 11:30  AM  [facility] [address] [place], [place] [address] [phone]

## 2021-12-26 NOTE — Patient Instructions (Addendum)
Increase Lamictal to 150 mg twice daily  Continue with Keppra 1500 mg twice daily  Continue your other medications  Routine EEG  Follow up in 6 months with Amy.

## 2022-01-08 ENCOUNTER — Ambulatory Visit (INDEPENDENT_AMBULATORY_CARE_PROVIDER_SITE_OTHER): Payer: Medicaid Other | Admitting: Neurology

## 2022-01-08 DIAGNOSIS — G40909 Epilepsy, unspecified, not intractable, without status epilepticus: Secondary | ICD-10-CM

## 2022-01-08 NOTE — Procedures (Signed)
    History:  31 year old woman with seizure disorder   EEG classification: Awake and drowsy  Description of the recording: The background rhythms of this recording consists of a fairly well modulated medium amplitude alpha rhythm of 9 Hz that is reactive to eye opening and closure. Present in the anterior head region is a 15-20 Hz beta activity. Photic stimulation was performed, did not show any abnormalities. Hyperventilation was also performed, did not show any abnormalities. Drowsiness was manifested by background fragmentation. No abnormal epileptiform discharges seen during this recording. There was no focal slowing. There were no electrographic seizure identified.   Abnormality: None   Impression: This is a normal EEG recorded while drowsy and awake. No evidence of interictal epileptiform discharges. Normal EEGs, however, do not rule out epilepsy.    Windell Norfolk, MD [facility]

## 2022-01-14 ENCOUNTER — Other Ambulatory Visit: Payer: Self-pay

## 2022-01-14 MED ORDER — LEVETIRACETAM 500 MG PO TABS: 1500.0000 mg | ORAL_TABLET | Freq: Two times a day (BID) | ORAL | 3 refills | Status: DC

## 2022-01-29 ENCOUNTER — Encounter (HOSPITAL_COMMUNITY): Payer: Medicaid Other | Admitting: Student in an Organized Health Care Education/Training Program

## 2022-03-03 ENCOUNTER — Telehealth: Payer: Self-pay | Admitting: Family Medicine

## 2022-03-03 DIAGNOSIS — Z5941 Food insecurity: Secondary | ICD-10-CM

## 2022-03-03 NOTE — Telephone Encounter (Signed)
Called family with Swahili interpreter. Patient's stepfather recently passed away. Patient's family reports food insecurity. Referral for managed medicaid SW for food insecurity.  Dorris Singh, MD  Family Medicine Teaching Service

## 2022-03-05 ENCOUNTER — Encounter (HOSPITAL_COMMUNITY): Payer: Medicaid Other | Admitting: Student in an Organized Health Care Education/Training Program

## 2022-03-07 ENCOUNTER — Other Ambulatory Visit: Payer: Medicaid Other

## 2022-03-07 NOTE — Patient Outreach (Signed)
  Medicaid Managed Care   Unsuccessful Outreach Note  03/07/2022 Name: Maanya Ali Cross MRN: [id number] DOB: 11/18/1990  Referred by: Brown, Carina M, MD Reason for referral : High Risk Managed Medicaid (MM social work telephone unsuccessful telephone outreach )   An unsuccessful telephone outreach was attempted today. The patient was referred to the case management team for assistance with care management and care coordination.   Follow Up Plan: A HIPAA compliant phone message was left for the patient providing contact information and requesting a return call.   Mohd. Derflinger, BSW, MHA [facility]  [place]  High Risk Managed Medicaid Team  [phone]  

## 2022-03-07 NOTE — Patient Instructions (Signed)
  Medicaid Managed Care   Unsuccessful Outreach Note  03/07/2022 Name: Patricia [name] MRN: [id number] DOB: [birth year]  Referred by: Martyn Malay, MD Reason for referral : High Risk Managed Medicaid (MM social work telephone unsuccessful telephone outreach )   An unsuccessful telephone outreach was attempted today. The patient was referred to the case management team for assistance with care management and care coordination.   Follow Up Plan: A HIPAA compliant phone message was left for the patient providing contact information and requesting a return call.   Mickel Fuchs, BSW, [place] Managed Medicaid Team  [phone]

## 2022-03-11 ENCOUNTER — Encounter (HOSPITAL_COMMUNITY): Payer: Self-pay | Admitting: Physician Assistant

## 2022-03-11 ENCOUNTER — Ambulatory Visit (INDEPENDENT_AMBULATORY_CARE_PROVIDER_SITE_OTHER): Payer: Medicaid Other | Admitting: Physician Assistant

## 2022-03-11 DIAGNOSIS — F4325 Adjustment disorder with mixed disturbance of emotions and conduct: Secondary | ICD-10-CM | POA: Diagnosis not present

## 2022-03-11 DIAGNOSIS — F3160 Bipolar disorder, current episode mixed, unspecified: Secondary | ICD-10-CM | POA: Diagnosis not present

## 2022-03-11 DIAGNOSIS — F02B18 Dementia in other diseases classified elsewhere, moderate, with other behavioral disturbance: Secondary | ICD-10-CM | POA: Diagnosis not present

## 2022-03-11 MED ORDER — RISPERIDONE 1 MG PO TABS: ORAL_TABLET | ORAL | 3 refills | Status: DC

## 2022-03-11 NOTE — Progress Notes (Signed)
BH MD/PA/NP OP Progress Note  03/11/2022 6:35 PM Patricia [name]  MRN:  [id number]  Chief Complaint:  Chief Complaint  Patient presents with   Medication Refill   Follow-up   HPI:   Patricia [name] is a 32 year old, African-American female with a past psychiatric history significant for adjustment disorder and bipolar disorder who presents to [facility] behavioral health outpatient clinic, accompanied by her social worker Lennette Bihari).  Patient presents today for follow-up regarding her medications.  Interpretive services were utilized during the duration of the encounter.  Patient is currently being managed on the following medications:  Risperdal 1 mg in the morning/2 mg at bedtime Lamictal 150 mg daily  Patient denies issues with her medications at this time.  Patient reports that she is taking her medications daily.  Patient does report that she has been having issues in her household.  She states that she does not like the place that she is living and [place]  Patient reports that she does not like it over here (in the [name]).  Patient was asked if she expressed her concerns with her family members, patient replied that she does not ask her mother about going back to [place].  Patient denies depression at this time.  She endorses anxiety stating that she has anxiety "sometimes."  When asked what her anxiety is over, patient states that her anxiety is due to her being sick.  The language interpreter was unable to determine what patient meant by "sickness."  Patient reports that she also does not get along with her siblings so it makes her want to harm herself.  She reports that she tried to go into oncoming traffic in order to be hurt.  Patient denies being a danger to herself at this time.  Patient is unable to perform a GAD-7 or PHQ-9 screen.  Patient is calm, cooperative, and engaged in conversation during the encounter.  Patient answers most questions  addressed to her.  Patient endorses good mood at this time.  Patient denies suicidal or homicidal ideations.  Patient further denies auditory or visual hallucinations but states that when she is not feeling well, she sees things.  Patient does not appear to be responding to internal/external stimuli.  Patient is unsure of the amount of sleep she receives.  Patient endorses good appetite and eats on average 3 meals per day.  Patient denies alcohol consumption, tobacco use, and illicit drug use.  Visit Diagnosis:    ICD-10-CM   1. Adjustment disorder with mixed disturbance of emotions and conduct  F43.25     2. Bipolar affective disorder, current episode mixed, current episode severity unspecified ([place])  F31.60 risperiDONE (RISPERDAL) 1 MG tablet    3. 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 No pertinent surgical history.  Family Psychiatric History:  Per patient's sister, they have a younger sister with a disability but it is not psychiatric in nature   Family History:  Family History  Problem Relation Age of Onset   Hepatitis Brother     Social  History:  Social History   Socioeconomic History   Marital status: Single    Spouse name: Not on file   Number of children: 1   Years of education: Not on file   Highest education level: Not on file  Occupational History    Comment: none  Tobacco Use   Smoking status: Never   Smokeless tobacco: Never  Vaping Use   Vaping Use: Never used  Substance and Sexual Activity   Alcohol use: Not Currently    Alcohol/week: 2.0  standard drinks of alcohol    Types: 2 Cans of beer per week    Comment: occasional alcohol use   Drug use: Never   Sexual activity: Not Currently  Other Topics Concern   Not on file  Social History Narrative   Lives with mother, family   Social Determinants of Health   Financial Resource Strain: Not on file  Food Insecurity: Not on file  Transportation Needs: Not on file  Physical Activity: Not on file  Stress: Not on file  Social Connections: Not on file    Allergies: No Known Allergies  Metabolic Disorder Labs: Lab Results  Component Value Date   HGBA1C 5.5 11/01/2021   MPG 105.41 06/30/2019   No results found for: "PROLACTIN" Lab Results  Component Value Date   CHOL 130 03/20/2021   TRIG 59 03/20/2021   HDL 48 03/20/2021   CHOLHDL 2.7 03/20/2021   VLDL 11 06/30/2019   LDLCALC 69 03/20/2021   [place] 80 06/30/2019   Lab Results  Component Value Date   TSH 1.312 06/30/2019   TSH 1.780 08/04/2017    Therapeutic Level Labs: No results found for: "LITHIUM" No results found for: "VALPROATE" No results found for: "CBMZ"  Current Medications: Current Outpatient Medications  Medication Sig Dispense Refill   lamoTRIgine (LAMICTAL) 150 MG tablet Take 1 tablet (150 mg total) by mouth 2 (two) times daily. 60 tablet 11   levETIRAcetam (KEPPRA) 500 MG tablet Take 3 tablets (1,500 mg total) by mouth 2 (two) times daily. 180 tablet 3   risperiDONE (RISPERDAL) 1 MG tablet Take 1 tablet  in the morning and take 2 tablets in the evening each day 90 tablet 3   No current facility-administered medications for this visit.     Musculoskeletal: Strength & Muscle Tone: within normal limits Gait & Station: normal Patient leans: N/A  Psychiatric Specialty Exam: Review of Systems  Psychiatric/Behavioral:  Negative for decreased concentration, dysphoric mood, hallucinations, self-injury, sleep disturbance and suicidal ideas. The patient is nervous/anxious. The patient is not  hyperactive.     There were no vitals taken for this visit.There is no height or weight on file to calculate BMI.  General Appearance: Fairly Groomed  Eye Contact:  Fair  Speech:  Clear and Coherent  Volume:  Normal  Mood:  Anxious and Hopeless  Affect:  Congruent  Thought Process:  Linear  Orientation:  Full (Time, Place, and Person)  Thought Content: Illogical   Suicidal Thoughts:  No, patient denies active suicidal ideations but states that tried to go into traffic to be hit  Homicidal Thoughts:  No  Memory:  Immediate;   Fair Recent;   Fair Remote;   Poor  Judgement:  Poor  Insight:  Lacking  Psychomotor Activity:  Normal  Concentration:  Concentration: Fair and Attention Span: Fair  Recall:  [employer] of Knowledge: Fair  Language: Fair  Akathisia:  No  Handed:  Patient unable to hold a pencil**  AIMS (  if indicated): not done  Assets:  Chief Executive Officer Social Support  ADL's:  Intact  Cognition: Impaired,  Moderate  Sleep:   Patient is unsure of the amount of hours she sleeps   Screenings: [place] Admission (Discharged) from 07/13/2020 in [place] [facility] Admission (Discharged) from 07/13/2019 in [place] Admission (Discharged) from 06/29/2019 in [place] [facility]  AIMS Total Score 0 0 0      AUDIT    Flowsheet Row Admission (Discharged) from 07/13/2020 in [place] [facility] Admission (Discharged) from 07/13/2019 in [place] Admission (Discharged) from 06/29/2019 in [place] [facility]  Alcohol Use Disorder Identification Test Final Score (AUDIT) 0 0 0      PHQ2-9    [place] Office Visit from 06/21/2021 in [place] Office Visit from 10/20/2019 in [place] ED from 09/18/2019 in [place] Office  Visit from 07/11/2019 in [place] Office Visit from 08/04/2017 in [place]  PHQ-2 Total Score 0 3 4 0 0  PHQ-9 Total Score -- 11 13 -- --      Flowsheet Row Clinical Support from 03/11/2022 in [facility] Office Visit from 10/02/2021 in [facility] Office Visit from 06/20/2021 in [place] Moderate Risk Moderate Risk Moderate Risk        Assessment and Plan:   Patricia [name] is a 32 year old, African-American female with a past psychiatric history significant for adjustment disorder and bipolar disorder who presents to [facility] behavioral health outpatient clinic, accompanied by her social worker Lennette Bihari).  Patient presents today for follow-up regarding her medications.  Interpretive services were utilized during the duration of the encounter.  She reports that she continues to take her medications as prescribed and without issue.  Patient denies depression but does endorse anxiety over being sick; however, patient did not clarify what she meant by being sick.  Patient also reported that she does not like living in the [name] and would like to return back home.  She reported that she did not discuss her concerns with her family members.  Patient denies being a danger to herself and is agreeable to continue taking medications as prescribed.  Patient's medications to be e-prescribed to pharmacy of choice.  Provider discussed with patient's social worker about involving family members and patient's appointments.  Patient's social worker informed provider that he would relay the information to patient's family.  Collaboration of Care: Collaboration of Care: Medication Management AEB provider managing patient's psychiatric medications and Psychiatrist AEB patient being seen by a mental health provider  Patient/Guardian was advised Release of  Information must be obtained prior to any record release in order to collaborate their care with an outside provider. Patient/Guardian was advised if they have not already done so to contact the registration department to sign all necessary forms in order for [name] to release information regarding their care.   Consent: Patient/Guardian gives verbal consent for treatment and assignment of benefits for services provided during this visit. Patient/Guardian expressed understanding and agreed to proceed.   1. Bipolar affective disorder, current episode mixed, current episode severity unspecified ([place]) Patient to continue taking Lamictal 150 mg daily for the management of her bipolar affective disorder  - risperiDONE (RISPERDAL) 1  MG tablet; Take 1 tablet  in the morning and take 2 tablets in the evening each day  Dispense: 90 tablet; Refill: 3  2. Adjustment disorder with mixed disturbance of emotions and conduct  3. 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

## 2022-04-11 ENCOUNTER — Other Ambulatory Visit: Payer: Medicaid Other

## 2022-04-11 NOTE — Patient Outreach (Signed)
  Medicaid Managed Care Social Work Note  04/11/2022 Name:  Patricia [name] MRN:  [id number] DOB:  10-30-1990  Patricia [name] is an 32 y.o. year old female who is a primary patient of Patricia Malay, MD.  The [facility] Managed Care Coordination team was consulted for assistance with:  Food Insecurity  Ms. Patricia [name] was given information about Medicaid Managed Care Coordination team services today. Patricia [name] Patient agreed to services and verbal consent obtained.  Engaged with patient  for by telephone forinitial visit in response to referral for case management and/or care coordination services.   Assessments/Interventions:  Review of past medical history, allergies, medications, health status, including review of consultants reports, laboratory and other test data, was performed as part of comprehensive evaluation and provision of chronic care management services.  SDOH: (Social Determinant of Health) assessments and interventions performed: SDOH Interventions    Flowsheet Row ED from 09/18/2019 in [facility] Emergency Department at [facility]  SDOH Interventions   Depression Interventions/Treatment  Referral to Psychiatry     BSW completed a telephone outreach with patients social worker Patricia [name], he stated patient is not working and she lives in a household of about 10 people. He states patient does receive foodstamps but states sometimes it is not enough. BSW then contacted patient with an interpreter and she stated she is now okay with food, and no resources are needed at this time. BSW provided her information if anything changes or for any questions. Patient does have a sister in law in the household that speaks english.  Advanced Directives Status:  Not addressed in this encounter.  Care Plan                 No Known Allergies  Medications Reviewed Today     Reviewed by Patricia Mood, PA (Physician Assistant) on 03/11/22 at [place] List Status: <None>    Medication Order Taking? Sig Documenting Provider Last Dose Status Informant  lamoTRIgine (LAMICTAL) 150 MG tablet Patricia [name]  Take 1 tablet (150 mg total) by mouth 2 (two) times daily. Patricia Ran, MD  Active   levETIRAcetam (KEPPRA) 500 MG tablet Patricia [name]  Take 3 tablets (1,500 mg total) by mouth 2 (two) times daily. 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   Follow up:  Patient requests no follow-up at this time.  Plan: The  Patient has been provided with contact information for the Managed Medicaid care management team and has been advised to call with any health related questions or concerns.    Patricia [name], BSW, [place] Managed Medicaid Team  [phone]

## 2022-04-22 ENCOUNTER — Ambulatory Visit (INDEPENDENT_AMBULATORY_CARE_PROVIDER_SITE_OTHER): Payer: Medicaid Other | Admitting: Physician Assistant

## 2022-04-22 ENCOUNTER — Encounter (HOSPITAL_COMMUNITY): Payer: Self-pay | Admitting: Physician Assistant

## 2022-04-22 VITALS — BP 94/69 | HR 69 | Temp 97.8°F | Wt 211.6 lb

## 2022-04-22 DIAGNOSIS — F02B18 Dementia in other diseases classified elsewhere, moderate, with other behavioral disturbance: Secondary | ICD-10-CM | POA: Diagnosis not present

## 2022-04-22 DIAGNOSIS — F3160 Bipolar disorder, current episode mixed, unspecified: Secondary | ICD-10-CM

## 2022-04-22 DIAGNOSIS — F4325 Adjustment disorder with mixed disturbance of emotions and conduct: Secondary | ICD-10-CM | POA: Diagnosis not present

## 2022-04-22 MED ORDER — RISPERIDONE 1 MG PO TABS: ORAL_TABLET | ORAL | 3 refills | Status: DC

## 2022-04-22 NOTE — Progress Notes (Unsigned)
BH MD/PA/NP OP Progress Note  04/22/2022 7:23 PM Patricia [name]  MRN:  [id number]  Chief Complaint:  Chief Complaint  Patient presents with   Follow-up   Medication Refill   HPI: ***  Patricia [name]  Visit Diagnosis:    ICD-10-CM   1. Bipolar affective disorder, current episode mixed, current episode severity unspecified ([place])  F31.60 risperiDONE (RISPERDAL) 1 MG tablet      Past Psychiatric History:  Mixed bipolar affective disorder Major neurocognitive disorder Adjustment disorder with mixed disturbance of emotions and conduct  Past Medical History:  Past Medical History:  Diagnosis Date   Bipolar 1 disorder ([place])    Cerebral palsy ([place])    Seizures ([place])    Suicidal behavior    History reviewed. No pertinent surgical history.  Family Psychiatric History:  Per patient's sister, they have a younger sister with a disability but it is not psychiatric in nature    Family History:  Family History  Problem Relation Age of Onset   Hepatitis Brother     Social History:  Social History   Socioeconomic History   Marital status: Single    Spouse name: Not on file   Number of children: 1   Years of education: Not on file   Highest education level: Not on file  Occupational History    Comment: none  Tobacco Use   Smoking status: Never   Smokeless tobacco: Never  Vaping Use   Vaping Use: Never used  Substance and Sexual Activity   Alcohol use: Not Currently    Alcohol/week: 2.0 standard drinks of alcohol    Types: 2 Cans of beer per week    Comment: occasional alcohol use   Drug use: Never   Sexual activity: Not Currently  Other Topics Concern   Not on file  Social History Narrative   Lives with mother, family   Social Determinants of Health   Financial Resource Strain: Not on file  Food Insecurity: Not on file  Transportation Needs: Not on file  Physical Activity: Not on file  Stress: Not on file  Social Connections: Not on file    Allergies:  No Known Allergies  Metabolic Disorder Labs: Lab Results  Component Value Date   HGBA1C 5.5 11/01/2021   MPG 105.41 06/30/2019   No results found for: "PROLACTIN" Lab Results  Component Value Date   CHOL 130 03/20/2021   TRIG 59 03/20/2021   HDL 48 03/20/2021   CHOLHDL 2.7 03/20/2021   VLDL 11 06/30/2019   LDLCALC 69 03/20/2021   [place] 80 06/30/2019   Lab Results  Component Value Date   TSH 1.312 06/30/2019   TSH 1.780 08/04/2017    Therapeutic Level Labs: No results found for: "LITHIUM" No results found for: "VALPROATE" No results found for: "CBMZ"  Current Medications: Current Outpatient Medications  Medication Sig Dispense Refill   lamoTRIgine (LAMICTAL) 150 MG tablet Take 1 tablet (150 mg total) by mouth 2 (two) times daily. 60 tablet 11   levETIRAcetam (KEPPRA) 500 MG tablet Take 3 tablets (1,500 mg total) by mouth 2 (two) times daily. 180 tablet 3   risperiDONE (RISPERDAL) 1 MG tablet Take 1 tablet  in the morning and take 2 tablets in the evening each day 90 tablet 3   No current facility-administered medications for this visit.     Musculoskeletal: Strength & Muscle Tone: within normal limits Gait & Station: normal Patient leans: N/A  Psychiatric Specialty Exam: Review of Systems  Psychiatric/Behavioral:  Positive  for suicidal ideas (Patient endorses suicidal ideation but denies active intent or plan). Negative for decreased concentration, dysphoric mood, hallucinations, self-injury and sleep disturbance. The patient is nervous/anxious. The patient is not hyperactive.     Blood pressure 94/69, pulse 69, temperature 97.8 F (36.6 C), temperature source Oral, weight 211 lb 9.6 oz (96 kg).Body mass index is 41.26 kg/m.  General Appearance: Casual  Eye Contact:  Fair  Speech:  Clear and Coherent  Volume:  Normal  Mood:  Anxious and Hopeless  Affect:  Congruent  Thought Process:  Linear  Orientation:  Full (Time, Place, and Person)  Thought Content:  Illogical   Suicidal Thoughts:  Yes.  without intent/plan  Homicidal Thoughts:  No  Memory:  Immediate;   Fair Recent;   Fair Remote;   Poor  Judgement:  Poor  Insight:  Lacking  Psychomotor Activity:  Normal  Concentration:  Concentration: Fair and Attention Span: Fair  Recall:  [employer] of Knowledge: Fair  Language: Fair  Akathisia:  No  Handed:    AIMS (if indicated): not done  Assets:  [employer] Social Support  ADL's:  Intact  Cognition: Impaired,  Moderate  Sleep:  Patient is unsure of the amount of hours she sleeps    Screenings: [place] Admission (Discharged) from 07/13/2020 in [place] [facility] Admission (Discharged) from 07/13/2019 in [place] Admission (Discharged) from 06/29/2019 in [place] [facility]  AIMS Total Score 0 0 0      AUDIT    Flowsheet Row Admission (Discharged) from 07/13/2020 in [place] [facility] Admission (Discharged) from 07/13/2019 in [place] Admission (Discharged) from 06/29/2019 in [place] [facility]  Alcohol Use Disorder Identification Test Final Score (AUDIT) 0 0 0      PHQ2-9    [place] Office Visit from 06/21/2021 in [place] Office Visit from 10/20/2019 in [place] ED from 09/18/2019 in [facility] Emergency Department at [facility] Office Visit from 07/11/2019 in [place] Office Visit from 08/04/2017 in [place]  PHQ-2 Total Score 0 3 4 0 0  PHQ-9 Total Score -- 11 13 -- --      Flowsheet Row Clinical Support from 04/22/2022 in [place] from 03/11/2022 in [facility] Office Visit from 10/02/2021 in [place] [facility]  Risk Moderate Risk Moderate Risk        Assessment and Plan: ***    Collaboration of Care: Collaboration of Care: Medication Management AEB provider managing patient's psychiatric medications and Psychiatrist AEB patient being followed by a mental health provider  Patient/Guardian was advised Release of Information must be obtained prior to any record release in order to collaborate their care with an outside provider. Patient/Guardian was advised if they have not already done so to contact the registration department to sign all necessary forms in order for [name] to release information regarding their care.   Consent: Patient/Guardian gives verbal consent for treatment and assignment of benefits for services provided during this visit. Patient/Guardian expressed understanding and agreed to proceed.   1. Bipolar affective disorder, current episode mixed, current episode severity unspecified ([place]) Patient to continue taking Lamictal 150 mg daily for the management of her bipolar affective disorder   -  risperiDONE (RISPERDAL) 1 MG tablet; Take 1 tablet  in the morning and take 2 tablets in the evening each day  Dispense: 90 tablet; Refill: 3  2. Adjustment disorder with mixed disturbance of emotions and conduct  3. 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

## 2022-04-24 ENCOUNTER — Other Ambulatory Visit: Payer: Self-pay | Admitting: *Deleted

## 2022-04-24 MED ORDER — LEVETIRACETAM 500 MG PO TABS: 1500.0000 mg | ORAL_TABLET | Freq: Two times a day (BID) | ORAL | 3 refills | Status: DC

## 2022-06-06 NOTE — Congregational Nurse Program (Signed)
  Dept: [phone]   Congregational Nurse Program Note  Date of Encounter: 06/06/2022  Past Medical History: Past Medical History:  Diagnosis Date   Bipolar 1 disorder (HCC)    Cerebral palsy (HCC)    Seizures (HCC)    Suicidal behavior     Encounter Details:  CNP Questionnaire - 06/06/22 1540       Questionnaire   Ask client: Do you give verbal consent for me to treat you today? Yes    Student Assistance N/A    Location Patient Served  [facility]    Visit Setting with Client Phone/Text/Email    Patient Status Refugee    Insurance Medicaid    Insurance/Financial Assistance Referral N/A    Medication Have Medication Insecurities    Medical Provider Yes    Screening Referrals Made N/A    Medical Referrals Made [facility] PCP[facility]Clinic    Medical Appointment Made N/A    Food Have Food Insecurities;Referred to Food Pantry    Transportation Need transportation assistance    Housing/Utilities N/A    Interpersonal Safety Do not feel safe at current residence    Interventions Advocate/Support;Navigate Healthcare System;Case Management;Counsel;Educate    Abnormal to Normal Screening Since Last CN Visit N/A    Screenings CN Performed N/A    Sent Client to Lab for: N/A    Did client attend any of the following based off CNs referral or appointments made? Medical    ED Visit Averted Yes    Life-Saving Intervention Made N/A           Patient`s mother called with complaints that patient is having generalized body itching. Appointment scheduled with [facility]M for monday. I will inform legal guardian Mr Caryn Bee.  Nicole Cella Dustin Bumbaugh RN BSN PCCN  [facility] [phone]

## 2022-06-08 NOTE — Progress Notes (Signed)
  SUBJECTIVE:   CHIEF COMPLAINT / HPI:   RASH  Had rash for months (cannot quantify further).  Location: right lower leg Medications tried: nothing Patient does not know what may have this.  New medications or antibiotics: no Tick, Insect or new pet exposure: yes but cannot further clarify. Recent travel: no New detergent or soap: unsure, she is using fragranced products  Symptoms Itching: yes Pain over rash: no Fever: no Trouble breathing: no Joint swelling or pain: no  PERTINENT  PMH / PSH: Cerebral palsy, bipolar affective disorder, seizure disorder  Patient Care Team: Westley Chandler, MD as PCP - General (Family Medicine) Tobey Grim, MD (Inactive) (Family Medicine) Tobey Grim, MD (Inactive) (Family Medicine) Patient, No Pcp Per (General Practice) Rich Reining as Social Worker OBJECTIVE:  BP (!) 112/58   Pulse 67   Ht 5\' 2"  (1.575 m)   Wt 206 lb 6.4 oz (93.6 kg)   SpO2 99%   BMI 37.75 kg/m  Gen: Well-appearing, NAD Skin -2 demarcated hypopigmented lesions along the right anterior medial lower leg without ulceration/tenderness/drainage, overall dry skin with flaking of both lower extremities    ASSESSMENT/PLAN:  Rash Assessment & Plan: Unfortunately poor historian, this appears more as dry skin rather than sequela of bug bite.  She also could not state which medications she was on although I do not suspect that is medications would be causing this rash.  I suspect fragrance products at home are not helping.  I have counseled against this and advised moisturizing frequently in addition to using prescribed hydrocortisone ointment.  Avoid hot showers as well.  Return as needed.  Orders: -     Hydrocortisone; Apply topically 2 (two) times daily for 14 days.  Dispense: 30 g; Refill: 3  Return if symptoms worsen or fail to improve. Shelby Mattocks, DO 06/09/2022, 12:10 PM PGY-2, [facility]

## 2022-06-09 ENCOUNTER — Encounter: Payer: Self-pay | Admitting: Student

## 2022-06-09 ENCOUNTER — Ambulatory Visit (INDEPENDENT_AMBULATORY_CARE_PROVIDER_SITE_OTHER): Payer: Medicaid Other | Admitting: Student

## 2022-06-09 VITALS — BP 112/58 | HR 67 | Ht 62.0 in | Wt 206.4 lb

## 2022-06-09 DIAGNOSIS — R21 Rash and other nonspecific skin eruption: Secondary | ICD-10-CM | POA: Insufficient documentation

## 2022-06-09 MED ORDER — HYDROCORTISONE 2.5 % EX OINT: TOPICAL_OINTMENT | Freq: Two times a day (BID) | CUTANEOUS | 3 refills | Status: DC

## 2022-06-09 NOTE — Assessment & Plan Note (Addendum)
Unfortunately poor historian, this appears more as dry skin rather than sequela of bug bite.  She also could not state which medications she was on although I do not suspect that is medications would be causing this rash.  I suspect fragrance products at home are not helping.  I have counseled against this and advised moisturizing frequently in addition to using prescribed hydrocortisone ointment.  Avoid hot showers as well.  Return as needed.

## 2022-06-09 NOTE — Patient Instructions (Addendum)
It was great to see you today! Thank you for choosing [facility] for your primary care. Patricia [name] was seen for rash.  Today we addressed: I have prescribed you hydrocortisone ointment to use up to 2 times daily for itching as needed for 2 weeks.  I would also recommend using a moisturizer such as Cetaphil or CeraVe a multiple times a day as this appears like dry skin.  Should this not improve, I would recommend reevaluation.  Please avoid fragranced products as that may irritate skin further.    If you haven't already, sign up for My Chart to have easy access to your labs results, and communication with your primary care physician.  I recommend that you always bring your medications to each appointment as this makes it easy to ensure you are on the correct medications and helps [name] not miss refills when you need them. Call the clinic at [phone] if your symptoms worsen or you have any concerns.  You should return to our clinic Return if symptoms worsen or fail to improve. Please arrive 15 minutes before your appointment to ensure smooth check in process.  We appreciate your efforts in making this happen.  Thank you for allowing me to participate in your care, Shelby Mattocks, DO 06/09/2022, 11:44 AM PGY-2, [facility]

## 2022-06-18 ENCOUNTER — Other Ambulatory Visit: Payer: Self-pay

## 2022-06-18 ENCOUNTER — Ambulatory Visit (INDEPENDENT_AMBULATORY_CARE_PROVIDER_SITE_OTHER): Payer: Medicaid Other | Admitting: Physician Assistant

## 2022-06-18 ENCOUNTER — Emergency Department (HOSPITAL_COMMUNITY)
Admission: EM | Admit: 2022-06-18 | Discharge: 2022-06-18 | Disposition: A | Discharge status: Home / Self Care | Payer: Medicaid Other | Attending: Emergency Medicine | Admitting: Emergency Medicine

## 2022-06-18 ENCOUNTER — Encounter (HOSPITAL_COMMUNITY): Payer: Self-pay | Admitting: Physician Assistant

## 2022-06-18 VITALS — BP 106/73 | HR 84 | Ht 62.0 in | Wt 210.0 lb

## 2022-06-18 DIAGNOSIS — F3160 Bipolar disorder, current episode mixed, unspecified: Secondary | ICD-10-CM

## 2022-06-18 DIAGNOSIS — F02B18 Dementia in other diseases classified elsewhere, moderate, with other behavioral disturbance: Secondary | ICD-10-CM | POA: Diagnosis not present

## 2022-06-18 DIAGNOSIS — F4325 Adjustment disorder with mixed disturbance of emotions and conduct: Secondary | ICD-10-CM | POA: Insufficient documentation

## 2022-06-18 DIAGNOSIS — X838XXA Intentional self-harm by other specified means, initial encounter: Secondary | ICD-10-CM | POA: Diagnosis not present

## 2022-06-18 LAB — CBC
HCT: 36 % (ref 36.0–46.0)
Hemoglobin: 11.3 g/dL — ABNORMAL LOW (ref 12.0–15.0)
MCH: 25.6 pg — ABNORMAL LOW (ref 26.0–34.0)
MCHC: 31.4 g/dL (ref 30.0–36.0)
MCV: 81.4 fL (ref 80.0–100.0)
Platelets: 211 10*3/uL (ref 150–400)
RBC: 4.42 MIL/uL (ref 3.87–5.11)
RDW: 14.3 % (ref 11.5–15.5)
WBC: 4.4 10*3/uL (ref 4.0–10.5)
nRBC: 0 % (ref 0.0–0.2)

## 2022-06-18 LAB — COMPREHENSIVE METABOLIC PANEL
ALT: 18 U/L (ref 0–44)
AST: 17 U/L (ref 15–41)
Albumin: 3.9 g/dL (ref 3.5–5.0)
Alkaline Phosphatase: 58 U/L (ref 38–126)
Anion gap: 10 (ref 5–15)
BUN: 6 mg/dL (ref 6–20)
CO2: 20 mmol/L — ABNORMAL LOW (ref 22–32)
Calcium: 9.1 mg/dL (ref 8.9–10.3)
Chloride: 110 mmol/L (ref 98–111)
Creatinine, Ser: 0.9 mg/dL (ref 0.44–1.00)
GFR, Estimated: >60 mL/min (ref >60)
Glucose, Bld: 95 mg/dL (ref 70–99)
Potassium: 3.5 mmol/L (ref 3.5–5.1)
Sodium: 140 mmol/L (ref 135–145)
Total Bilirubin: 0.4 mg/dL (ref 0.3–1.2)
Total Protein: 7.6 g/dL (ref 6.5–8.1)

## 2022-06-18 LAB — RAPID URINE DRUG SCREEN, HOSP PERFORMED
Amphetamines: NOT DETECTED
Barbiturates: NOT DETECTED
Benzodiazepines: NOT DETECTED
Cocaine: NOT DETECTED
Opiates: NOT DETECTED
Tetrahydrocannabinol: NOT DETECTED

## 2022-06-18 LAB — CBG MONITORING, ED: Glucose-Capillary: 86 mg/dL (ref 70–99)

## 2022-06-18 LAB — ACETAMINOPHEN LEVEL: Acetaminophen (Tylenol), Serum: <10 ug/mL — ABNORMAL LOW (ref 10–30)

## 2022-06-18 LAB — I-STAT BETA HCG BLOOD, ED (MC, WL, AP ONLY): I-stat hCG, quantitative: <5 m[IU]/mL (ref <5)

## 2022-06-18 LAB — SALICYLATE LEVEL: Salicylate Lvl: <7 mg/dL — ABNORMAL LOW (ref 7.0–30.0)

## 2022-06-18 LAB — ETHANOL: Alcohol, Ethyl (B): <10 mg/dL (ref <10)

## 2022-06-18 MED ORDER — RISPERIDONE 1 MG PO TABS
ORAL_TABLET | ORAL | 2 refills | Status: DC
Start: 2022-06-18 — End: 2022-11-12

## 2022-06-18 NOTE — Discharge Instructions (Addendum)
Piga simu [place] kesho ili kupata miadi haraka [place].  jisahau kwamba hautafanya chochote kibaya au kilichokithiri wakati una hisia za [place]. Lynnea Ferrier nyakati ambazo umekuwa na matumaini. o Watu wengi wamepitia mawazo na hisia za Marcelino Duster, na wewe Trinidad Curet pia. o Lucila Maine na hisia hizi hapo awali, jikumbushe kwamba unaweza kuzipitia tena.  Wajulishe familia, marafiki, walimu, au washauri jinsi unavyohisi. Usijitenge na wale wanaokujali na wanaotaka kukusaidia. o Zungumza na mtu kila siku, hata kama hupendi [place] na mtu yeyote au kuwa na watu wengine. o Mazungumzo ya ana kwa ana ni bora kuwasaidia kuelewa hisia zako.  [place]iliana na mhudumu wa afya ya akili na ufanye kazi na mtu huyu mara kwa [place].  Tengeneza mpango wa usalama ambao unaweza kufuata wakati wa shida. o Jumuisha nambari za simu za simu za dharura za [place], wataalamu wa afya ya [place], na marafiki na wanafamilia unaowaamini unaoweza [place] simu wakati wa dharura. o Hifadhi nambari hizi kwenye simu yako.  Ikiwa unafikiria kutumia dawa nyingi, mpe dawa yako mtu ambaye anaweza kukupa kama ilivyoagizwa. o Lead unatumia dawamfadhaiko na [place] Grice kuwa utazidisha dozi, mwambie mtoa [place] wa afya ili aweze kukupa dawa salama zaidi.  Jaribu [place] na taratibu zako na kufuata ratiba kila siku. Fanya kujijali kuwa kipaumbele.  Tengeneza orodha ya malengo ya kweli, na uyavunje unapoyatimiza. Mafanikio yanaweza kukupa hisia ya thamani.  Subiri hadi ujisikie vizuri kabla ya kufanya mambo ambayo unaona kuwa magumu au yasiyopendeza.  Fanya mambo ambayo umefurahia siku zote ili kuondoa mawazo yako kwenye hisia zako. o Jaribu [place], au kusikiliza au [address] o [place] wakati nje, kwa asili, [place] kukusaidia kujisikia vizuri.

## 2022-06-18 NOTE — Progress Notes (Signed)
BH MD/PA/NP OP Progress Note  06/18/2022 4:21 PM Patricia [name]  MRN:  [id number]  Chief Complaint:  Chief Complaint  Patient presents with   Medication Refill   Follow-up   HPI:   Patricia [name] is a 32 year old, African-American female with a past psychiatric history significant for adjustment disorder and bipolar disorder who presents to [facility] behavioral health outpatient clinic, accompanied by her social worker Caryn Bee).  Patient presents today for follow-up regarding her medications.  Interpretive services were utilized during the duration of the encounter.  Patient is currently being managed on the following medications:   Risperdal 1 mg in the morning/2 mg at bedtime Lamictal 150 mg daily  ***  Patient is calm, cooperative, and engaged in conversation during the encounter. Patient answers most questions addressed to her. ***  Visit Diagnosis:    ICD-10-CM   1. Adjustment disorder with mixed disturbance of emotions and conduct  F43.25     2. Bipolar affective disorder, current episode mixed, current episode severity unspecified  F31.60 risperiDONE (RISPERDAL) 1 MG tablet    3. 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 No pertinent surgical history.  Family Psychiatric History:  Per patient's sister, they have a younger sister with a disability but it is not psychiatric in nature    Family History:  Family History  Problem Relation Age of Onset   Hepatitis Brother     Social History:  Social History   Socioeconomic History   Marital status: Single    Spouse  name: Not on file   Number of children: 1   Years of education: Not on file   Highest education level: Not on file  Occupational History    Comment: none  Tobacco Use   Smoking status: Never   Smokeless tobacco: Never  Vaping Use   Vaping Use: Never used  Substance and Sexual Activity   Alcohol use: Not Currently    Alcohol/week: 2.0 standard drinks of alcohol    Types: 2 Cans of beer per week    Comment: occasional alcohol use   Drug use: Never   Sexual activity: Not Currently  Other Topics Concern   Not on file  Social History Narrative   Lives with mother, family   Social Determinants of Health   Financial Resource Strain: Not on file  Food Insecurity: Not on file  Transportation Needs: Not on file  Physical Activity: Not on file  Stress: Not on file  Social Connections: Not on file    Allergies: No Known Allergies  Metabolic Disorder Labs: Lab Results  Component Value Date   HGBA1C 5.5 11/01/2021   MPG 105.41 06/30/2019   No results found for: "PROLACTIN" Lab Results  Component Value Date   CHOL 130 03/20/2021   TRIG 59 03/20/2021   HDL 48 03/20/2021   CHOLHDL 2.7 03/20/2021   VLDL 11 06/30/2019   LDLCALC 69 03/20/2021   LDLCALC 80 06/30/2019   Lab Results  Component Value Date   TSH 1.312 06/30/2019   TSH 1.780 08/04/2017    Therapeutic Level Labs: No results found for: "LITHIUM"  No results found for: "VALPROATE" No results found for: "CBMZ"  Current Medications: Current Outpatient Medications  Medication Sig Dispense Refill   hydrocortisone 2.5 % ointment Apply topically 2 (two) times daily for 14 days. 30 g 3   lamoTRIgine (LAMICTAL) 150 MG tablet Take 1 tablet (150 mg total) by mouth 2 (two) times daily. 60 tablet 11   levETIRAcetam (KEPPRA) 500 MG tablet Take 3 tablets (1,500 mg total) by mouth 2 (two) times daily. 180 tablet 3   risperiDONE (RISPERDAL) 1 MG tablet Take 1 tablet  in the morning and take 2 tablets in the evening each day 90  tablet 2   No current facility-administered medications for this visit.     Musculoskeletal: Strength & Muscle Tone: within normal limits Gait & Station: normal Patient leans: N/A  Psychiatric Specialty Exam: Review of Systems  Psychiatric/Behavioral:  Positive for suicidal ideas (Patient endorses suicidal ideation but denies active intent or plan). Negative for decreased concentration, dysphoric mood, hallucinations, self-injury and sleep disturbance. The patient is not nervous/anxious and is not hyperactive.     Blood pressure 106/73, pulse 84, height  (1.575 m), weight 210 lb (95.3 kg), SpO2 100 %.Body mass index is 38.41 kg/m.  General Appearance: Casual  Eye Contact:  Fair  Speech:  Clear and Coherent  Volume:  Normal  Mood:  Depressed and Hopeless  Affect:  Congruent  Thought Process:  Linear  Orientation:  Full (Time, Place, and Person)  Thought Content: Illogical   Suicidal Thoughts:  Yes.  without intent/plan  Homicidal Thoughts:  No  Memory:  Immediate;   Fair Recent;   Fair Remote;   Poor  Judgement:  Poor  Insight:  Lacking  Psychomotor Activity:  Normal  Concentration:  Concentration: Fair and Attention Span: Fair  Recall:  [employer] of Knowledge: Fair  Language: Fair  Akathisia:  No  Handed:    AIMS (if indicated): not done  Assets:  Engineer, maintenance Social Support  ADL's:  Intact  Cognition: Impaired,  Moderate  Sleep:  Patient is unsure of the amount of hours she sleeps    Screenings: AIMS    Flowsheet Row [facility] Ad[facility] (Discharged) from [facility] Ad[facility] ([facility]) [facility]  AIMS Total Score 0 0 0      AUDIT    Flowsheet Row Ad[facility]) [facility] Admission (Discharged) from 07/13/2019  [facility] Admission (Discharged) [facility]  Alcohol Use Disorder Identification Test Final Score (AUDIT) 0 0 0      PHQ2-9    Flowsheet Row Office Visit from 06/09/2022 in [facility] Office Visit from 06/21/2021 in [facility] Office Visit from 10/20/2019 in [facility] ED from 09/18/2019 in [facility] Department at [facility] Office Visit from 07/11/2019 in [facility]  PHQ-2 Total Score 0 0 3 4 0  PHQ-9 Total Score 0 -- 11 13 --      Flowsheet Row ED from 06/18/2022 in [facility] Emergency Department at [facility] Most recent reading at 06/18/2022  2:34 PM Clinical Support from 06/18/2022 in [facility] Most recent reading at 06/18/2022 11:51 AM Clinical Support from 04/22/2022 in [facility]  Most recent reading at 04/22/2022  2:19 PM  C-SSRS RISK CATEGORY High Risk High Risk High Risk        Assessment and Plan:   Yumiko AMariel [name] is a 32 year old, African-American female with a past psychiatric history significant for adjustment disorder and bipolar disorder who presents to [facility] behavioral health outpatient clinic, accompanied by her social worker Caryn Bee).  Patient presents today for follow-up regarding her medications.  Interpretive services were utilized during the duration of the encounter. ***  Collaboration of Care: Collaboration of Care: Medication Management AEB provider managing patient's psychiatric medications and Psychiatrist AEB patient being followed by a mental health provider  Patient/Guardian was advised Release of Information must be obtained prior to any record release in order to collaborate their care with an outside provider. Patient/Guardian was advised if they have not already done so to contact the  registration department to sign all necessary forms in order for [name] to release information regarding their care.   Consent: Patient/Guardian gives verbal consent for treatment and assignment of benefits for services provided during this visit. Patient/Guardian expressed understanding and agreed to proceed.   1. Bipolar affective disorder, current episode mixed, current episode severity unspecified  - risperiDONE (RISPERDAL) 1 MG tablet; Take 1 tablet  in the morning and take 2 tablets in the evening each day  Dispense: 90 tablet; Refill: 2  2. Adjustment disorder with mixed disturbance of emotions and conduct  3. 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

## 2022-06-18 NOTE — ED Notes (Signed)
Called legal guardian, no answer.
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
## 2022-06-18 NOTE — ED Notes (Signed)
Pt brought into room 31. Translator at bedside with psych team
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
## 2022-06-18 NOTE — ED Notes (Signed)
All 7 emergency contact numbers called with no answers.

## 2022-06-18 NOTE — ED Notes (Signed)
Sister [name] called back. She will come pick up patient.

## 2022-06-18 NOTE — ED Notes (Signed)
I bag of patient belongings placed in locker 31
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
## 2022-07-16 NOTE — Patient Instructions (Signed)
Below is our plan:  We will continue lamotrigine 150mg  and levetiracetam 1500mg  twice daily.   Please make sure you are consistent with timing of seizure medication. I recommend annual visit with primary care provider (PCP) for complete physical and routine blood work. I recommend daily intake of vitamin D (400-800iu) and calcium (800-1000mg ) for bone health. Discuss Dexa screening with PCP.   According to [place] law, you can not drive unless you are seizure / syncope free for at least 6 months and under physician's care.  Please maintain precautions. Do not participate in activities where a loss of awareness could harm you or someone else. No swimming alone, no tub bathing, no hot tubs, no driving, no operating motorized vehicles (cars, ATVs, motocycles, etc), lawnmowers, power tools or firearms. No standing at heights, such as rooftops, ladders or stairs. Avoid hot objects such as stoves, heaters, open fires. Wear a helmet when riding a bicycle, scooter, skateboard, etc. and avoid areas of traffic. Set your water heater to 120 degrees or less.  Please make sure you are staying well hydrated. I recommend 50-60 ounces daily. Well balanced diet and regular exercise encouraged. Consistent sleep schedule with 6-8 hours recommended.   Please continue follow up with care team as directed.   Follow up with me in 1 year   You may receive a survey regarding today's visit. I encourage you to leave honest feed back as I do use this information to improve patient care. Thank you for seeing me today!

## 2022-07-17 ENCOUNTER — Ambulatory Visit (INDEPENDENT_AMBULATORY_CARE_PROVIDER_SITE_OTHER): Payer: Medicaid Other | Admitting: Family Medicine

## 2022-07-17 ENCOUNTER — Encounter: Payer: Self-pay | Admitting: Family Medicine

## 2022-07-17 VITALS — BP 90/60 | HR 81 | Ht 62.0 in | Wt 204.5 lb

## 2022-07-17 DIAGNOSIS — Z758 Other problems related to medical facilities and other health care: Secondary | ICD-10-CM

## 2022-07-17 DIAGNOSIS — G40909 Epilepsy, unspecified, not intractable, without status epilepticus: Secondary | ICD-10-CM | POA: Diagnosis not present

## 2022-07-17 DIAGNOSIS — F4325 Adjustment disorder with mixed disturbance of emotions and conduct: Secondary | ICD-10-CM

## 2022-07-17 DIAGNOSIS — Z603 Acculturation difficulty: Secondary | ICD-10-CM | POA: Diagnosis not present

## 2022-07-17 DIAGNOSIS — G808 Other cerebral palsy: Secondary | ICD-10-CM

## 2022-07-17 DIAGNOSIS — F819 Developmental disorder of scholastic skills, unspecified: Secondary | ICD-10-CM

## 2022-08-13 ENCOUNTER — Other Ambulatory Visit: Payer: Self-pay | Admitting: Family Medicine

## 2022-08-19 ENCOUNTER — Encounter (HOSPITAL_COMMUNITY): Payer: Medicaid Other | Admitting: Physician Assistant

## 2022-08-23 ENCOUNTER — Emergency Department (HOSPITAL_COMMUNITY): Payer: Medicaid Other

## 2022-08-23 ENCOUNTER — Other Ambulatory Visit: Payer: Self-pay

## 2022-08-23 ENCOUNTER — Emergency Department (HOSPITAL_COMMUNITY)
Admission: EM | Admit: 2022-08-23 | Discharge: 2022-08-23 | Disposition: A | Discharge status: Home / Self Care | Payer: Medicaid Other | Attending: Emergency Medicine | Admitting: Emergency Medicine

## 2022-08-23 DIAGNOSIS — S3991XA Unspecified injury of abdomen, initial encounter: Secondary | ICD-10-CM | POA: Diagnosis not present

## 2022-08-23 DIAGNOSIS — S52611A Displaced fracture of right ulna styloid process, initial encounter for closed fracture: Secondary | ICD-10-CM | POA: Diagnosis not present

## 2022-08-23 DIAGNOSIS — S0990XA Unspecified injury of head, initial encounter: Secondary | ICD-10-CM | POA: Diagnosis not present

## 2022-08-23 DIAGNOSIS — S299XXA Unspecified injury of thorax, initial encounter: Secondary | ICD-10-CM | POA: Insufficient documentation

## 2022-08-23 DIAGNOSIS — S6991XA Unspecified injury of right wrist, hand and finger(s), initial encounter: Secondary | ICD-10-CM | POA: Diagnosis present

## 2022-08-23 DIAGNOSIS — Y92410 Unspecified street and highway as the place of occurrence of the external cause: Secondary | ICD-10-CM | POA: Insufficient documentation

## 2022-08-23 DIAGNOSIS — M7989 Other specified soft tissue disorders: Secondary | ICD-10-CM | POA: Diagnosis not present

## 2022-08-23 LAB — CBC WITH DIFFERENTIAL/PLATELET
Abs Immature Granulocytes: 0.03 10*3/uL (ref 0.00–0.07)
Basophils Absolute: 0 10*3/uL (ref 0.0–0.1)
Basophils Relative: 0 %
Eosinophils Absolute: 0.2 10*3/uL (ref 0.0–0.5)
Eosinophils Relative: 3 %
HCT: 35.4 % — ABNORMAL LOW (ref 36.0–46.0)
Hemoglobin: 11.3 g/dL — ABNORMAL LOW (ref 12.0–15.0)
Immature Granulocytes: 0 %
Lymphocytes Relative: 51 %
Lymphs Abs: 3.5 10*3/uL (ref 0.7–4.0)
MCH: 26.2 pg (ref 26.0–34.0)
MCHC: 31.9 g/dL (ref 30.0–36.0)
MCV: 82.1 fL (ref 80.0–100.0)
Monocytes Absolute: 0.7 10*3/uL (ref 0.1–1.0)
Monocytes Relative: 10 %
Neutro Abs: 2.5 10*3/uL (ref 1.7–7.7)
Neutrophils Relative %: 36 %
Platelets: 234 10*3/uL (ref 150–400)
RBC: 4.31 MIL/uL (ref 3.87–5.11)
RDW: 13.9 % (ref 11.5–15.5)
WBC: 6.9 10*3/uL (ref 4.0–10.5)
nRBC: 0 % (ref 0.0–0.2)

## 2022-08-23 LAB — COMPREHENSIVE METABOLIC PANEL
ALT: 17 U/L (ref 0–44)
AST: 35 U/L (ref 15–41)
Albumin: 3.5 g/dL (ref 3.5–5.0)
Alkaline Phosphatase: 50 U/L (ref 38–126)
Anion gap: 11 (ref 5–15)
BUN: 12 mg/dL (ref 6–20)
CO2: 16 mmol/L — ABNORMAL LOW (ref 22–32)
Calcium: 8.8 mg/dL — ABNORMAL LOW (ref 8.9–10.3)
Chloride: 113 mmol/L — ABNORMAL HIGH (ref 98–111)
Creatinine, Ser: 0.9 mg/dL (ref 0.44–1.00)
GFR, Estimated: >60 mL/min (ref >60)
Glucose, Bld: 135 mg/dL — ABNORMAL HIGH (ref 70–99)
Potassium: 3.6 mmol/L (ref 3.5–5.1)
Sodium: 140 mmol/L (ref 135–145)
Total Bilirubin: 0.9 mg/dL (ref 0.3–1.2)
Total Protein: 7 g/dL (ref 6.5–8.1)

## 2022-08-23 LAB — I-STAT CHEM 8, ED
BUN: 12 mg/dL (ref 6–20)
Calcium, Ion: 1.16 mmol/L (ref 1.15–1.40)
Chloride: 112 mmol/L — ABNORMAL HIGH (ref 98–111)
Creatinine, Ser: 0.8 mg/dL (ref 0.44–1.00)
Glucose, Bld: 130 mg/dL — ABNORMAL HIGH (ref 70–99)
HCT: 36 % (ref 36.0–46.0)
Hemoglobin: 12.2 g/dL (ref 12.0–15.0)
Potassium: 3.2 mmol/L — ABNORMAL LOW (ref 3.5–5.1)
Sodium: 142 mmol/L (ref 135–145)
TCO2: 17 mmol/L — ABNORMAL LOW (ref 22–32)

## 2022-08-23 LAB — HCG, QUANTITATIVE, PREGNANCY: hCG, Beta Chain, Quant, S: <1 m[IU]/mL (ref <5)

## 2022-08-23 MED ORDER — IOHEXOL 350 MG/ML SOLN
75.0000 mL | Freq: Once | INTRAVENOUS | Status: AC | PRN
  Administered 2022-08-23: 75 mL via INTRAVENOUS

## 2022-08-23 MED ORDER — ONDANSETRON HCL 4 MG/2ML IJ SOLN
INTRAMUSCULAR | Status: AC
  Filled 2022-08-23: qty 2
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
## 2022-08-23 NOTE — Progress Notes (Signed)
Orthopedic Tech Progress Note Patient Details:  Patricia [name] October 13, 1990 [id number]  Ortho Devices Type of Ortho Device: Sugartong splint, Shoulder immobilizer Ortho Device/Splint Location: RUE Ortho Device/Splint Interventions: Ordered, Application, Adjustment   Post Interventions Patient Tolerated: Well  Al Decant 08/23/2022, 6:14 AM
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
## 2022-08-23 NOTE — Progress Notes (Signed)
   08/23/22 0000  Spiritual Encounters  Type of Visit Initial  Care provided to: Patient  Conversation partners present during encounter Nurse  Referral source Trauma page  Reason for visit Trauma  OnCall Visit Yes   Ch responded to trauma page. Family was on the phone translating for pt. Ch provided support. No follow-up needed at this time.
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
## 2022-08-29 ENCOUNTER — Ambulatory Visit: Payer: Medicaid Other | Admitting: Family Medicine

## 2022-09-01 ENCOUNTER — Ambulatory Visit (INDEPENDENT_AMBULATORY_CARE_PROVIDER_SITE_OTHER): Payer: MEDICAID | Admitting: Family Medicine

## 2022-09-01 ENCOUNTER — Ambulatory Visit (INDEPENDENT_AMBULATORY_CARE_PROVIDER_SITE_OTHER): Payer: MEDICAID | Admitting: Student

## 2022-09-01 ENCOUNTER — Other Ambulatory Visit: Payer: Self-pay

## 2022-09-01 VITALS — BP 106/72 | HR 88 | Ht 62.0 in | Wt 198.2 lb

## 2022-09-01 VITALS — BP 122/82 | Ht 62.0 in | Wt 198.0 lb

## 2022-09-01 DIAGNOSIS — S6991XA Unspecified injury of right wrist, hand and finger(s), initial encounter: Secondary | ICD-10-CM

## 2022-09-01 DIAGNOSIS — S52614A Nondisplaced fracture of right ulna styloid process, initial encounter for closed fracture: Secondary | ICD-10-CM
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
## 2022-09-01 NOTE — Patient Instructions (Addendum)
It was great to see you! Thank you for allowing me to participate in your care!  Your wrist is broken. There is a bone in the wrist that is broken and will need surgical management to get better.   Our plans for today:  - Broken Wrist  -We are sending you to [facility], to see if they can put your wrist in a splint  Patricia [name] [facility]  Address: [address] [place], [place], [place] [address] Phone: [phone] Go there at 1:30 pm   -You will need to schedule an appointment with the Hand Surgeon Patricia Beards, MD.  Address: [facility] [address]  [address]  [place] [address]  Phone: [phone]   Take care and seek immediate care sooner if you develop any concerns.   Dr. Bess Kinds, MD [facility]     Candis Shine nzuri Troy! Asante kwa kuniruhusu kushiriki Patricia [name] wako!  Mkono wako umevunjika. Kuna mfupa kwenye kifundo cha mkono ambao umevunjika na utahitaji usimamizi wa upasuaji ili kupata nafuu.   Mipango yetu ya leo:  - Kifundo cha mkono kilichovunjika  -[place] kwa [facility], uone kama wanaweza kuweka kifundo cha mkono wako [place].  Kituo cha Dawa cha [facility]  Anwani: [address] [place], [place], [place] [address] Simu: [phone] Patricia [name] saa 1:30 jioni   -Collene Schlichter miadi na Daktari wa Upasuaji wa Patricia Bibles, MD.  [facility] [address]  [address]  [place] [address]  Simu: [phone]   Jihadharini na utafute huduma ya haraka mapema ikiwa unapata wasiwasi wowote.   Dk. Bess Kinds, MD [facility]

## 2022-09-01 NOTE — Patient Instructions (Addendum)
Dr. Yehuda Budd [facility] [address] [place] [phone]  Appt: 09/03/22 @ 1:50 pm
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
## 2022-09-02 DIAGNOSIS — S52613A Displaced fracture of unspecified ulna styloid process, initial encounter for closed fracture: Secondary | ICD-10-CM | POA: Insufficient documentation
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
## 2022-09-09 ENCOUNTER — Telehealth: Payer: Self-pay | Admitting: Family Medicine
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
## 2022-09-09 NOTE — Telephone Encounter (Signed)
Called [facility]---patient may be scheduled for surgery (? Unsure of follow up per scheduler). Left our office number to call.  [name], MD  Family Medicine Teaching Service

## 2022-09-17 ENCOUNTER — Other Ambulatory Visit: Payer: Self-pay | Admitting: Student

## 2022-09-17 DIAGNOSIS — R21 Rash and other nonspecific skin eruption: Secondary | ICD-10-CM
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
## 2022-11-12 ENCOUNTER — Ambulatory Visit (HOSPITAL_COMMUNITY): Payer: MEDICAID | Admitting: Physician Assistant

## 2022-11-12 ENCOUNTER — Encounter (HOSPITAL_COMMUNITY): Payer: Self-pay | Admitting: Physician Assistant

## 2022-11-12 VITALS — BP 99/69 | HR 70 | Ht 59.84 in | Wt 194.0 lb

## 2022-11-12 DIAGNOSIS — F02B18 Dementia in other diseases classified elsewhere, moderate, with other behavioral disturbance: Secondary | ICD-10-CM | POA: Diagnosis not present

## 2022-11-12 DIAGNOSIS — F3160 Bipolar disorder, current episode mixed, unspecified: Secondary | ICD-10-CM | POA: Diagnosis not present

## 2022-11-12 DIAGNOSIS — F4325 Adjustment disorder with mixed disturbance of emotions and conduct: Secondary | ICD-10-CM

## 2022-11-12 MED ORDER — RISPERIDONE 1 MG PO TABS
ORAL_TABLET | ORAL | 2 refills | Status: DC
Start: 2022-11-12 — End: 2023-01-14
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
## 2022-11-13 ENCOUNTER — Other Ambulatory Visit: Payer: Self-pay | Admitting: Family Medicine

## 2022-12-10 ENCOUNTER — Other Ambulatory Visit: Payer: Self-pay | Admitting: Student

## 2022-12-10 ENCOUNTER — Other Ambulatory Visit: Payer: Self-pay | Admitting: Neurology

## 2022-12-10 DIAGNOSIS — R21 Rash and other nonspecific skin eruption: Secondary | ICD-10-CM

## 2022-12-31 ENCOUNTER — Ambulatory Visit (HOSPITAL_COMMUNITY): Payer: MEDICAID | Admitting: Mental Health

## 2023-01-08 ENCOUNTER — Other Ambulatory Visit: Payer: Self-pay | Admitting: Family Medicine

## 2023-01-14 ENCOUNTER — Ambulatory Visit (INDEPENDENT_AMBULATORY_CARE_PROVIDER_SITE_OTHER): Payer: MEDICAID | Admitting: Physician Assistant

## 2023-01-14 ENCOUNTER — Encounter (HOSPITAL_COMMUNITY): Payer: Self-pay | Admitting: Physician Assistant

## 2023-01-14 VITALS — BP 96/68 | HR 69 | Temp 97.5°F | Ht 59.84 in | Wt 189.8 lb

## 2023-01-14 DIAGNOSIS — Z79899 Other long term (current) drug therapy: Secondary | ICD-10-CM

## 2023-01-14 DIAGNOSIS — F3160 Bipolar disorder, current episode mixed, unspecified: Secondary | ICD-10-CM

## 2023-01-14 DIAGNOSIS — F02B18 Dementia in other diseases classified elsewhere, moderate, with other behavioral disturbance: Secondary | ICD-10-CM | POA: Diagnosis not present

## 2023-01-14 DIAGNOSIS — F4325 Adjustment disorder with mixed disturbance of emotions and conduct: Secondary | ICD-10-CM

## 2023-01-14 MED ORDER — RISPERIDONE 1 MG PO TABS
ORAL_TABLET | ORAL | 2 refills | Status: DC
Start: 2023-01-14 — End: 2023-04-22

## 2023-01-14 NOTE — Progress Notes (Signed)
BH MD/PA/NP OP Progress Note  01/14/2023 6:53 PM Patricia [name]  MRN:  [id number]  Chief Complaint:  Chief Complaint  Patient presents with   Follow-up   Medication Refill   HPI:   Patricia [name] is a 32 year old, African-American female with a past psychiatric history significant for adjustment disorder and bipolar disorder who presents to [facility], accompanied by her social worker Caryn Bee).  Patient presents today for follow-up regarding her medications.  Interpretive services were utilized during the duration of the encounter.  Patient is currently being managed on the following medications: Risperdal 1 mg in the morning/2 mg at bedtime  Patient reports that she is okay.  She states that her arm is feeling much better and states that she has been much better as of late.  She reports that she has been taking her medications regularly.  She reports no issues with her medications at this time.  Patient denies depression nor does she endorse anxiety.  She reports that she has been getting along with her her family members in the household.  Patient is unable to perform a GAD-7 or PHQ-9 screen.  Patient also reports that she has been taking her seizure medications but states that she recently had a seizure yesterday.  Per patient social worker, patient has been having multiple seizures and states that he witnessed the patient having a seizure a few weeks ago.  Patient reports that she does not remember when her last neurology appointment was.  Provider informed patient that her neurologist will be notified about her recent seizures.  Patient is calm, cooperative, and engaged in conversation during the encounter. Patient answers most questions addressed to her.  Patient reports that she is feeling okay.  Patient denies suicidal or homicidal ideations.  She further denies auditory or visual hallucinations and does not appear to be responding to  internal/external stimuli.  Patient is unsure of the amount of hours she receives at night for sleep but states that her sleep quality has been good.  Patient endorses good appetite and eats on average 2-3 times per day.  Patient denies alcohol consumption, tobacco use, or illicit drug use.  Visit Diagnosis:    ICD-10-CM   1. Long term current use of antipsychotic medication  Z79.899 Lipid Profile    CBC with Differential    Comprehensive Metabolic Panel (CMET)    HgB A1c    2. 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 No pertinent surgical history.  Family Psychiatric History:  Per patient's sister, they have a younger sister with a disability but it is not psychiatric in nature    Family History:  Family History  Problem Relation Age of Onset   Hepatitis Brother     Social History:  Social History   Socioeconomic History   Marital status: Single    Spouse name: Not on file   Number of children: 1   Years of education: Not on file   Highest education level: Not on file  Occupational History    Comment: none  Tobacco Use   Smoking status: Never   Smokeless tobacco: Never  Vaping Use   Vaping status: Never Used  Substance and Sexual Activity   Alcohol use: Not Currently    Alcohol/week: 2.0 standard drinks of alcohol    Types: 2 Cans of beer per week    Comment: occasional alcohol use   Drug use: Never   Sexual activity: Not Currently  Other Topics Concern   Not on file  Social History Narrative   Lives with mother, family   Social  Determinants of Health   Financial Resource Strain: Not on file  Food Insecurity: Not on file  Transportation Needs: Not on file  Physical Activity: Not on file  Stress: Not on file  Social Connections: Not on file    Allergies: No Known Allergies  Metabolic Disorder Labs: Lab Results  Component Value Date   HGBA1C 5.5 11/01/2021   MPG 105.41 06/30/2019   No results found for: "PROLACTIN" Lab Results  Component Value Date   CHOL 130 03/20/2021   TRIG 59 03/20/2021   HDL 48 03/20/2021   CHOLHDL 2.7 03/20/2021   VLDL 11 06/30/2019   LDLCALC 69 03/20/2021   LDLCALC 80 06/30/2019   Lab Results  Component Value Date   TSH 1.312 06/30/2019   TSH 1.780 08/04/2017    Therapeutic Level Labs: No results found for: "LITHIUM" No results found for: "VALPROATE" No results found for: "CBMZ"  Current Medications: Current Outpatient Medications  Medication Sig Dispense Refill   hydrocortisone 2.5 % ointment APPLY TOPICALLY TWO (TWO) TIMES DAILY FOR 14 DAYS 30 g 1   lamoTRIgine (LAMICTAL) 150 MG tablet TAKE ONE TABLET BY MOUTH TWICE A DAY 60 tablet 10   levETIRAcetam (KEPPRA) 500 MG tablet TAKE THREE TABLETS BY MOUTH TWICE A DAY 180 tablet 3   risperiDONE (RISPERDAL) 1 MG tablet Take 1 tablet  in the morning and take 2 tablets in the evening each day 90 tablet 2   No current facility-administered medications for this visit.     Musculoskeletal: Strength & Muscle Tone: within normal limits Gait & Station: normal Patient leans: N/A  Psychiatric Specialty Exam: Review of Systems  Psychiatric/Behavioral:  Negative for decreased concentration, dysphoric mood, hallucinations, self-injury, sleep disturbance and suicidal ideas. The patient is not nervous/anxious and is not hyperactive.     Blood pressure 96/68, pulse 69, temperature (!) 97.5 F (36.4 C), temperature source Oral, height 4' 11.84" (1.52 m), weight 189 lb 12.8 oz (86.1 kg), SpO2 100%.Body mass index is 37.27 kg/m.   General Appearance: Casual  Eye Contact:  Fair  Speech:  Clear and Coherent  Volume:  Normal  Mood:  Euthymic  Affect:  Appropriate  Thought Process:  Linear  Orientation:  Full (Time, Place, and Person)  Thought Content: WDL   Suicidal Thoughts:  No  Homicidal Thoughts:  No  Memory:  Immediate;   Fair Recent;   Fair Remote;   Poor  Judgement:  Poor  Insight:  Lacking  Psychomotor Activity:  Normal  Concentration:  Concentration: Fair and Attention Span: Fair  Recall:  [employer] of Knowledge: Fair  Language: Fair  Akathisia:  No  Handed:    AIMS (if indicated): not done  Assets:  Engineer, maintenance Social Support  ADL's:  Intact  Cognition: Impaired,  Moderate  Sleep:  Patient is unsure of the amount of hours she sleeps    Screenings: AIMS    Flowsheet Row [facility] Ad[facility] (Discharged) from [facility] Admission (Discharged) [facility]  AIMS Total Score 0 0 0      AUDIT    Flowsheet Row Ad[facility]) [facility] Admission (Discharged) from 07/13/2019 [facility] Admission (Discharged) from [facility]  Alcohol Use Disorder Identification Test Final Score (AUDIT) 0 0 0      PHQ2-9    Flowsheet Row Office Visit from 06/09/2022 in [facility] Dept Of [place]. [facility] Office Visit from 06/21/2021 in [facility] - [facility] Of [facility]. [facility] Office Visit from 10/20/2019 in [facility] - A Dept Of [facility]. [facility] ED from 09/18/2019 in [facility] Emergency Department at [facility] Office Visit from 07/11/2019 in [facility] - A Dept Of [place]. [facility]  PHQ-2 Total Score 0 0 3 4 0  PHQ-9 Total Score 0 -- 11 13 --      Flowsheet Row Clinical Support from 01/14/2023 in [facility] ED from 08/23/2022 in [facility] Emergency Department at [facility] ED from 06/18/2022 in [facility] Emergency Department at [facility]  C-SSRS RISK CATEGORY Moderate Risk No Risk High Risk        Assessment and Plan:   Halford Decamp is a 32 year old, African-American female with a past psychiatric history significant for adjustment disorder and bipolar disorder who presents to [facility], accompanied by her social worker Caryn Bee). Patient presents today for follow-up regarding her medications.  Interpretive services were utilized during the duration of the encounter.  Patient presents to the encounter stating that she has been taking her medication regularly.  She denies depressive symptoms nor does she endorse anxiety.  She also reports that she is continuing to get along with her family members.  Provider is not able to collaborate patient's information due to none of her family members being present during the encounter.  The patient does mention that she has been experiencing seizures and states that she had a seizure yesterday.  Per patient's caseworker, patient has been experiencing an increase and frequency of her seizures and states that he witnessed her have a seizure a few weeks back.  Patient reports that she is continuing to take her antiseizure medications regularly.  Provider informed patient that her neurologist would be contacted to discuss patient's seizure activity.  Patient to continue taking her medication as prescribed.  Patient's medications to be prescribed to pharmacy of choice.  Prior to the conclusion of the encounter, provider requested the following labs to be performed on the patient: Lipid panel, CBC with differential, CMP, and  hemoglobin A1c.  Patient social worker vocalized understanding.  Collaboration of Care: Collaboration of Care: Medication Management AEB provider managing patient's psychiatric medications and Psychiatrist AEB patient being followed by a mental health provider  Patient/Guardian was advised Release of Information must be obtained prior to any record release in order to collaborate their care with an outside provider. Patient/Guardian was advised if they have not already done so to contact the registration department to sign all necessary forms in order for [name] to release information regarding their care.   Consent: Patient/Guardian gives verbal consent for treatment  and assignment of benefits for services provided during this visit. Patient/Guardian expressed understanding and agreed to proceed.   1. Bipolar affective disorder, current episode mixed, current episode severity unspecified (HCC)  - risperiDONE (RISPERDAL) 1 MG tablet; Take 1 tablet  in the morning and take 2 tablets in the evening each day  Dispense: 90 tablet; Refill: 2  2. Long term current use of antipsychotic medication  - Lipid Profile - CBC with Differential - Comprehensive Metabolic Panel (CMET) - HgB A1c  3. Adjustment disorder with mixed disturbance of emotions and conduct  4. 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

## 2023-02-03 ENCOUNTER — Other Ambulatory Visit: Payer: Self-pay | Admitting: Student

## 2023-02-03 DIAGNOSIS — R21 Rash and other nonspecific skin eruption: Secondary | ICD-10-CM

## 2023-03-03 ENCOUNTER — Other Ambulatory Visit: Payer: Self-pay | Admitting: Student

## 2023-03-03 DIAGNOSIS — R21 Rash and other nonspecific skin eruption: Secondary | ICD-10-CM

## 2023-03-18 ENCOUNTER — Encounter (HOSPITAL_COMMUNITY): Payer: MEDICAID | Admitting: Physician Assistant

## 2023-03-31 ENCOUNTER — Other Ambulatory Visit: Payer: Self-pay | Admitting: Student

## 2023-03-31 DIAGNOSIS — R21 Rash and other nonspecific skin eruption: Secondary | ICD-10-CM

## 2023-04-22 ENCOUNTER — Ambulatory Visit (INDEPENDENT_AMBULATORY_CARE_PROVIDER_SITE_OTHER): Payer: MEDICAID | Admitting: Physician Assistant

## 2023-04-22 VITALS — BP 98/67 | HR 76 | Ht 59.84 in | Wt 178.4 lb

## 2023-04-22 DIAGNOSIS — F3160 Bipolar disorder, current episode mixed, unspecified: Secondary | ICD-10-CM | POA: Diagnosis not present

## 2023-04-22 DIAGNOSIS — F02B18 Dementia in other diseases classified elsewhere, moderate, with other behavioral disturbance: Secondary | ICD-10-CM

## 2023-04-22 DIAGNOSIS — Z79899 Other long term (current) drug therapy: Secondary | ICD-10-CM | POA: Diagnosis not present

## 2023-04-22 DIAGNOSIS — F4325 Adjustment disorder with mixed disturbance of emotions and conduct: Secondary | ICD-10-CM | POA: Diagnosis not present

## 2023-04-22 MED ORDER — RISPERIDONE 2 MG PO TABS
2.0000 mg | ORAL_TABLET | Freq: Two times a day (BID) | ORAL | 1 refills | Status: DC
Start: 2023-04-22 — End: 2023-06-24

## 2023-04-23 ENCOUNTER — Encounter (HOSPITAL_COMMUNITY): Payer: Self-pay | Admitting: Physician Assistant

## 2023-04-23 NOTE — Progress Notes (Signed)
 BH MD/PA/NP OP Progress Note  04/22/2023 8:24 PM Patricia [name]  MRN:  [id number]  Chief Complaint:  Chief Complaint  Patient presents with   Follow-up   Medication Management   HPI:   Patricia [name] is a 33 year old, African-American female with a past psychiatric history significant for adjustment disorder and bipolar disorder who presents to [facility], accompanied by her social worker Caryn Bee) and sister.. Patient presents today for follow-up regarding her medication.  Interpretive services were utilized during the duration of the encounter.  Patient is currently being managed on the following medications: Risperdal 1 mg in the morning/2 mg at bedtime  Patient reports that everything is going well and denies any major issues or concerns at this time.  She reports that she is taking her medications regularly and denies experiencing any adverse side effects.  Patient denies experiencing depression; however, her sister says that the patient has been experiencing some issues with her mood.  Patient's sister states that the patient's depression appears to be part of the patient's life so it occurs every day.  Despite patient's depression, patient's sister states that the patient has not been exhibiting any new bizarre behavior.  She does state that the patient continues to insult and curse at people in the family.  Patient's sister states that the patient appears to be thinking about bad things and will curse at people as a result.  She reports that the patient curses at people because she is sick.   Patient's sister reports that the patient will occasionally grab water from the sink and poured on people.  She also reports that the patient has spat at people as well as punched them.  She reports that the punching and spitting occurs roughly 3 times a week and is directed towards random people in the family.  Despite patient's behavior, patient's sister  states that she feels safe at home. Patient is unable to perform a GAD-7 or PHQ-9 screen.   Patient is calm, cooperative, and engaged in conversation during the encounter. Patient answers most questions addressed to her.  Patient reports that she is feeling okay.  Per patient's sister, patient was angry earlier this morning and stated that she was not going to go to this appointment.  Patient denies suicidal or homicidal ideations.  She further denies auditory or visual hallucinations and does not appear to be responding to internal/external stimuli.  Patient's sister believes that the patient is seeing people come towards her and will often curse at them.  Patient's sister states that the patient has been waking up unexpectedly in the middle of the night.  She states that this habit is worse in the beginning and end of the month.  Patient's sister reports that the patient does not eat a lot but will often eat later in the day.  Patient denies alcohol consumption, tobacco use, or illicit drug use.   Visit Diagnosis:    ICD-10-CM   1. Adjustment disorder with mixed disturbance of emotions and conduct  F43.25     2. Bipolar affective disorder, current episode mixed, current episode severity unspecified (HCC)  F31.60 risperiDONE (RISPERDAL) 2 MG tablet    3. Long term current use of antipsychotic medication  Z79.899 Lipid Profile    CBC with Differential    Comprehensive Metabolic Panel (CMET)    Hemoglobin A1c    4. 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 No pertinent surgical  history.  Family Psychiatric History:  Per patient's sister, they have a younger sister with a disability but it is not psychiatric in nature    Family History:  Family History  Problem Relation Age of Onset   Hepatitis Brother     Social History:  Social History   Socioeconomic History   Marital status: Single    Spouse name: Not on file   Number of children: 1   Years of education: Not on file   Highest education level: Not on file  Occupational History    Comment: none  Tobacco Use   Smoking status: Never   Smokeless tobacco: Never  Vaping Use   Vaping status: Never Used  Substance and Sexual Activity   Alcohol use: Not Currently    Alcohol/week: 2.0 standard drinks of alcohol    Types: 2 Cans of beer per week    Comment: occasional alcohol use   Drug use: Never   Sexual activity: Not Currently  Other Topics Concern   Not on file  Social History Narrative   Lives with mother, family   Social Drivers of Health   Financial Resource Strain: Not on file  Food Insecurity: Not on file  Transportation Needs: Not on file  Physical Activity: Not on file  Stress: Not on file  Social Connections: Not on file    Allergies: No Known Allergies  Metabolic Disorder Labs: Lab Results  Component Value Date   HGBA1C 5.5 11/01/2021   MPG 105.41 06/30/2019   No results found for: "PROLACTIN" Lab Results  Component Value Date   CHOL 130 03/20/2021   TRIG 59 03/20/2021   HDL 48 03/20/2021   CHOLHDL 2.7 03/20/2021   VLDL 11 06/30/2019   LDLCALC 69 03/20/2021   LDLCALC 80 06/30/2019   Lab Results  Component Value Date   TSH 1.312 06/30/2019   TSH 1.780 08/04/2017    Therapeutic Level Labs: No results found for: "LITHIUM" No results found for: "VALPROATE" No results found for: "CBMZ"  Current Medications: Current Outpatient Medications  Medication Sig Dispense Refill   hydrocortisone 2.5 % ointment APPLY TOPICALLY TWO (TWO) TIMES DAILY FOR 14 DAYS 30 g 0    lamoTRIgine (LAMICTAL) 150 MG tablet TAKE ONE TABLET BY MOUTH TWICE A DAY 60 tablet 10   levETIRAcetam (KEPPRA) 500 MG tablet TAKE THREE TABLETS BY MOUTH TWICE A DAY 180 tablet 3   risperiDONE (RISPERDAL) 2 MG tablet Take 1 tablet (2 mg total) by mouth 2 (two) times daily. Take 1 tablet  in the morning and take 2 tablets in the evening each day 180 tablet 1   No current facility-administered medications for this visit.     Musculoskeletal: Strength & Muscle Tone: within normal limits Gait & Station: normal Patient leans: N/A  Psychiatric Specialty Exam: Review of Systems  Psychiatric/Behavioral:  Negative for decreased concentration, dysphoric mood, hallucinations, self-injury, sleep disturbance and suicidal ideas. The patient is not nervous/anxious and is not hyperactive.     Blood pressure 98/67, pulse 76, height 4' 11.84" (1.52 m), weight 178 lb 6.4 oz (80.9 kg), SpO2 100%.Body mass index is 35.03 kg/m.  General  Appearance: Casual  Eye Contact:  Fair  Speech:  Clear and Coherent  Volume:  Normal  Mood:  Euthymic  Affect:  Appropriate  Thought Process:  Linear  Orientation:  Full (Time, Place, and Person)  Thought Content: WDL   Suicidal Thoughts:  No  Homicidal Thoughts:  No  Memory:  Immediate;   Fair Recent;   Fair Remote;   Poor  Judgement:  Poor  Insight:  Lacking  Psychomotor Activity:  Normal  Concentration:  Concentration: Fair and Attention Span: Fair  Recall:  [employer] of Knowledge: Fair  Language: Fair  Akathisia:  No  Handed:    AIMS (if indicated): not done  Assets:  Engineer, maintenance Social Support  ADL's:  Intact  Cognition: Impaired,  Moderate  Sleep:  Patient is unsure of the amount of hours she sleeps    Screenings: AIMS    Flowsheet Row [facility] Admission (Discharged) from 07/13/2019 [facility] Admission (Discharged) from  [facility]  AIMS Total Score 0 0 0      AUDIT    Flowsheet Row Admission (Discharged) [facility] Admission (Discharged) from 07/13/2019 [facility] Admission (Discharged) from [facility]  Alcohol Use Disorder Identification Test Final Score (AUDIT) 0 0 0      PHQ2-9    Flowsheet Row Office Visit from 06/09/2022 in [facility] - A Dept Of [place]. [facility] Office Visit from 06/21/2021 in [facility] - [facility] Dept Of [facility]. [facility] Office Visit from 10/20/2019 in [facility] - A Dept Of [facility]. [facility] ED from 09/18/2019 in [facility] Emergency Department at [facility] Office Visit from 07/11/2019 in [facility] - A Dept Of [place]. [facility]  PHQ-2 Total Score 0 0 3 4 0  PHQ-9 Total Score 0 -- 11 13 --      Flowsheet Row Clinical Support from 01/14/2023 in [facility] ED from 08/23/2022 in [facility] Emergency Department at [facility] ED from 06/18/2022 in [facility] Emergency Department at [facility]  C-SSRS RISK CATEGORY Moderate Risk No Risk High Risk        Assessment and Plan:   Patricia [name] is a 34 year old, African-American female with a past psychiatric history significant for adjustment disorder and bipolar disorder who presents to [facility], accompanied by her social worker Caryn Bee) and sister. Patient presents today for follow-up regarding her medication.  Interpretive services were utilized during the duration of the encounter.   During the assessment, patient reported taking her medications regularly and denied any issues or concerns at this time.  Despite patient denying depressive symptoms,  patient's sister informed provider that the patient has been acting out lately.  She reports that the patient has been known to grab water from the sink and poured on people in the family.  She also reports that the patient has spat at and punched people in the family.  She reports that it is not uncommon for the patient to insult members of the family every day.  Despite denying auditory or visual hallucinations, patient's sister informed provider that she believes that the patient is experiencing  visual hallucinations and is responding to them.  Provider recommended increasing patient's risperidone from 1 mg in the morning/2 mg at bedtime to 2 mg in the morning and 2 mg at bedtime for mood stability and for the alleviation of possible hallucinations.  Patient was agreeable to recommendation.  Patient's medication to be e-prescribed through pharmacy of choice.  Due to patient's use of risperidone, provider to obtain the following labs from the patient: Hemoglobin A1c, lipid profile, complete metabolic panel, and complete blood count with differential.  Collaboration of Care: Collaboration of Care: Medication Management AEB provider managing patient's psychiatric medications and Psychiatrist AEB patient being followed by a mental health provider  Patient/Guardian was advised Release of Information must be obtained prior to any record release in order to collaborate their care with an outside provider. Patient/Guardian was advised if they have not already done so to contact the registration department to sign all necessary forms in order for [name] to release information regarding their care.   Consent: Patient/Guardian gives verbal consent for treatment and assignment of benefits for services provided during this visit. Patient/Guardian expressed understanding and agreed to proceed.   1. Bipolar affective disorder, current episode mixed, current episode severity unspecified (HCC)  - risperiDONE (RISPERDAL)  2 MG tablet; Take 1 tablet (2 mg total) by mouth 2 (two) times daily. Take 1 tablet  in the morning and take 2 tablets in the evening each day  Dispense: 180 tablet; Refill: 1  2. Adjustment disorder with mixed disturbance of emotions and conduct (Primary)  3. Long term current use of antipsychotic medication  - Lipid Profile; Future - CBC with Differential; Future - Comprehensive Metabolic Panel (CMET); Future - Hemoglobin A1c; Future  4. 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

## 2023-04-30 ENCOUNTER — Other Ambulatory Visit: Payer: Self-pay | Admitting: Family Medicine

## 2023-05-20 ENCOUNTER — Telehealth: Payer: Self-pay

## 2023-05-20 NOTE — Telephone Encounter (Signed)
 I have contacted legal guardian-kevin to inform that appointment has been scheduled.  Nicole Cella Oluwatobi Ruppe RN BSN PCCN  [facility] [phone]

## 2023-06-08 NOTE — Congregational Nurse Program (Signed)
  Dept: [phone]   Congregational Nurse Program Note  Date of Encounter: 06/08/2023  Past Medical History: Past Medical History:  Diagnosis Date   Bipolar 1 disorder (HCC)    Cerebral palsy (HCC)    Seizures (HCC)    Suicidal behavior     Encounter Details:  Community Questionnaire - 06/08/23 2335       Questionnaire   Ask client: Do you give verbal consent for me to treat you today? N/A    Student Assistance N/A    Location Patient Served  [facility]    [facility]    Population Status Migrant/Refugee    Insurance Medicaid    Insurance/Financial Assistance Referral N/A    Medication Have Medication Insecurities    Medical Provider Yes    Screening Referrals Made N/A    Medical Referrals Made N/A    Medical Appointment Completed [facility];Dental    Screenings CN Performed N/A    ED Visit Averted N/A    Life-Saving Intervention Made N/A            10 30am- Patient presented to [facility] accompanied by her legal guardian Ernestina Headland. I was requested by her legal guardian to assist with language interpretation. Dental procedure was completed and patient tolerated very well. Next appointment scheduled and I will assist as well.  Perla Bradford Aislynn Cifelli RN BSN PCCN  [facility] [phone]-office

## 2023-06-09 LAB — CBC WITH DIFFERENTIAL/PLATELET
Basophils Absolute: 0 10*3/uL (ref 0.0–0.2)
Basos: 1 %
EOS (ABSOLUTE): 0.2 10*3/uL (ref 0.0–0.4)
Eos: 3 %
Hematocrit: 39.1 % (ref 34.0–46.6)
Hemoglobin: 12.3 g/dL (ref 11.1–15.9)
Immature Grans (Abs): 0 10*3/uL (ref 0.0–0.1)
Immature Granulocytes: 0 %
Lymphocytes Absolute: 2.3 10*3/uL (ref 0.7–3.1)
Lymphs: 39 %
MCH: 26.4 pg — ABNORMAL LOW (ref 26.6–33.0)
MCHC: 31.5 g/dL (ref 31.5–35.7)
MCV: 84 fL (ref 79–97)
Monocytes Absolute: 0.5 10*3/uL (ref 0.1–0.9)
Monocytes: 9 %
Neutrophils Absolute: 2.8 10*3/uL (ref 1.4–7.0)
Neutrophils: 48 %
Platelets: 218 10*3/uL (ref 150–450)
RBC: 4.66 x10E6/uL (ref 3.77–5.28)
RDW: 13.8 % (ref 11.7–15.4)
WBC: 5.8 10*3/uL (ref 3.4–10.8)

## 2023-06-09 LAB — COMPREHENSIVE METABOLIC PANEL WITH GFR
ALT: 19 IU/L (ref 0–32)
AST: 16 IU/L (ref 0–40)
Albumin: 4.5 g/dL (ref 3.9–4.9)
Alkaline Phosphatase: 83 IU/L (ref 44–121)
BUN/Creatinine Ratio: 13 (ref 9–23)
BUN: 11 mg/dL (ref 6–20)
Bilirubin Total: 0.2 mg/dL (ref 0.0–1.2)
CO2: 18 mmol/L — ABNORMAL LOW (ref 20–29)
Calcium: 9.2 mg/dL (ref 8.7–10.2)
Chloride: 107 mmol/L — ABNORMAL HIGH (ref 96–106)
Creatinine, Ser: 0.87 mg/dL (ref 0.57–1.00)
Globulin, Total: 2.8 g/dL (ref 1.5–4.5)
Glucose: 83 mg/dL (ref 70–99)
Potassium: 4.2 mmol/L (ref 3.5–5.2)
Sodium: 141 mmol/L (ref 134–144)
Total Protein: 7.3 g/dL (ref 6.0–8.5)
eGFR: 91 mL/min/{1.73_m2} (ref >59)

## 2023-06-09 LAB — LIPID PANEL
Chol/HDL Ratio: 2.5 ratio (ref 0.0–4.4)
Cholesterol, Total: 140 mg/dL (ref 100–199)
HDL: 57 mg/dL (ref >39)
LDL Chol Calc (NIH): 73 mg/dL (ref 0–99)
Triglycerides: 44 mg/dL (ref 0–149)
VLDL Cholesterol Cal: 10 mg/dL (ref 5–40)

## 2023-06-09 LAB — HEMOGLOBIN A1C
Est. average glucose Bld gHb Est-mCnc: 105 mg/dL
Hgb A1c MFr Bld: 5.3 % (ref 4.8–5.6)

## 2023-06-11 ENCOUNTER — Ambulatory Visit: Payer: MEDICAID

## 2023-06-15 ENCOUNTER — Other Ambulatory Visit: Payer: Self-pay | Admitting: *Deleted

## 2023-06-15 DIAGNOSIS — R21 Rash and other nonspecific skin eruption: Secondary | ICD-10-CM

## 2023-06-15 MED ORDER — HYDROCORTISONE 2.5 % EX OINT
TOPICAL_OINTMENT | Freq: Every day | CUTANEOUS | 0 refills | Status: AC | PRN
Start: 2023-06-15 — End: ?

## 2023-06-18 ENCOUNTER — Ambulatory Visit: Payer: MEDICAID

## 2023-06-18 DIAGNOSIS — Z975 Presence of (intrauterine) contraceptive device: Secondary | ICD-10-CM | POA: Insufficient documentation

## 2023-06-24 ENCOUNTER — Ambulatory Visit (HOSPITAL_COMMUNITY): Payer: MEDICAID | Admitting: Physician Assistant

## 2023-06-24 ENCOUNTER — Encounter (HOSPITAL_COMMUNITY): Payer: Self-pay | Admitting: Physician Assistant

## 2023-06-24 VITALS — BP 114/70 | HR 81 | Ht 59.84 in | Wt 177.8 lb

## 2023-06-24 DIAGNOSIS — F3160 Bipolar disorder, current episode mixed, unspecified: Secondary | ICD-10-CM

## 2023-06-24 DIAGNOSIS — F4325 Adjustment disorder with mixed disturbance of emotions and conduct: Secondary | ICD-10-CM | POA: Diagnosis not present

## 2023-06-24 DIAGNOSIS — F02B18 Dementia in other diseases classified elsewhere, moderate, with other behavioral disturbance: Secondary | ICD-10-CM | POA: Diagnosis not present

## 2023-06-24 MED ORDER — RISPERIDONE 2 MG PO TABS
2.0000 mg | ORAL_TABLET | Freq: Two times a day (BID) | ORAL | 1 refills | Status: DC
Start: 2023-06-24 — End: 2023-08-19

## 2023-06-24 NOTE — Progress Notes (Signed)
 [facility] MD/PA/NP OP Progress Note  06/24/2023 9:34 PM Patricia [name]  MRN:  [id number]  Chief Complaint:  Chief Complaint  Patient presents with   Follow-up   Medication Refill   HPI:   Patricia [name] is a 33 year old, African-American female with a past psychiatric history significant for adjustment disorder and bipolar disorder who presents to [facility], accompanied by her social worker Patricia [name]). Patient presents today for follow-up regarding her medication.  Interpretive services were utilized during the duration of the encounter.  Patient is currently being managed on the following medications: Risperdal  2 mg in the morning/2 mg at bedtime  Patient reports that she is good. Patient reports no issues or concerns regarding her use of risperidone . Patient reports that she has been taking her medications regularly. Per Child psychotherapist, patient's family is concerned about immigration and patient's status. Patient does not have their green card.  Patient denies depression nor does she endorse anxiety. Patient reports that things in the household have been well. Patient is unable to perform a GAD-7 or PHQ-9 screen.  An aims assessment was performed with the patient scoring a 0.  Patient is calm, cooperative, and engaged in conversation during the encounter. Patient answers most questions addressed to her.  Patient reports that she is feeling well.  Patient exhibits euthymic mood with appropriate affect.  Patient denies suicidal or homicidal ideations.  She further denies auditory or visual hallucinations and does not appear to be responding to internal/external stimuli.  Patient endorses good sleep stating that she sleeps a lot.  Patient endorses her appetite needs on average 2 meals per day.  Patient denies alcohol consumption, tobacco use, or illicit drug use.  Visit Diagnosis:    ICD-10-CM   1. Bipolar affective disorder, current episode mixed, current  episode severity unspecified (HCC)  F31.60 risperiDONE  (RISPERDAL ) 2 MG tablet    2. Adjustment disorder with mixed disturbance of emotions and conduct  F43.25     3. 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 No pertinent surgical history.  Family Psychiatric History:  Per patient's sister, they have a younger sister with a disability but it is not psychiatric in nature    Family History:  Family History  Problem Relation Age of Onset   Hepatitis Brother     Social History:  Social History   Socioeconomic History   Marital status: Single    Spouse name: Not on file   Number of children: 1   Years of education: Not on file   Highest education level: Not on file  Occupational History    Comment: none  Tobacco Use   Smoking status: Never   Smokeless tobacco: Never  Vaping Use   Vaping status: Never Used  Substance and Sexual Activity   Alcohol use: Not Currently    Alcohol/week: 2.0 standard drinks of alcohol    Types: 2 Cans of beer per week    Comment: occasional alcohol use   Drug use: Never   Sexual activity: Not Currently  Other Topics Concern   Not  on file  Social History Narrative   Lives with mother, family   Social Drivers of Health   Financial Resource Strain: Not on file  Food Insecurity: Not on file  Transportation Needs: Not on file  Physical Activity: Not on file  Stress: Not on file  Social Connections: Not on file    Allergies: No Known Allergies  Metabolic Disorder Labs: Lab Results  Component Value Date   HGBA1C 5.3  06/08/2023   MPG 105.41 06/30/2019   No results found for: "PROLACTIN" Lab Results  Component Value Date   CHOL 140 06/08/2023   TRIG 44 06/08/2023   HDL 57 06/08/2023   CHOLHDL 2.5 06/08/2023   VLDL 11 06/30/2019   LDLCALC 73 06/08/2023   LDLCALC 69 03/20/2021   Lab Results  Component Value Date   TSH 1.312 06/30/2019   TSH 1.780 08/04/2017    Therapeutic Level Labs: No results found for: "LITHIUM" No results found for: "VALPROATE" No results found for: "CBMZ"  Current Medications: Current Outpatient Medications  Medication Sig Dispense Refill   hydrocortisone  2.5 % ointment Apply topically daily as needed. 30 g 0   lamoTRIgine  (LAMICTAL ) 150 MG tablet TAKE ONE TABLET BY MOUTH TWICE A DAY 60 tablet 10   levETIRAcetam  (KEPPRA ) 500 MG tablet TAKE THREE TABLETS BY MOUTH TWICE A DAY 180 tablet 2   risperiDONE  (RISPERDAL ) 2 MG tablet Take 1 tablet (2 mg total) by mouth 2 (two) times daily. Take 1 tablet  in the morning and take 2 tablets in the evening each day 180 tablet 1   No current facility-administered medications for this visit.     Musculoskeletal: Strength & Muscle Tone: within normal limits Gait & Station: normal Patient leans: N/A  Psychiatric Specialty Exam: Review of Systems  Psychiatric/Behavioral:  Negative for decreased concentration, dysphoric mood, hallucinations, self-injury, sleep disturbance and suicidal ideas. The patient is not nervous/anxious and is not hyperactive.     Blood pressure 114/70, pulse 81, height 4' 11.84" (1.52 m), weight 177 lb 12.8 oz (80.6 kg), SpO2 100%.Body mass index is 34.91 kg/m.  General Appearance: Casual  Eye Contact:  Fair  Speech:  Clear and Coherent  Volume:  Normal  Mood:  Euthymic  Affect:  Appropriate  Thought Process:  Linear  Orientation:  Full (Time, Place, and Person)  Thought Content: WDL   Suicidal Thoughts:  No  Homicidal Thoughts:  No  Memory:  Immediate;   Fair Recent;   Fair Remote;   Poor   Judgement:  Poor  Insight:  Lacking  Psychomotor Activity:  Normal  Concentration:  Concentration: Fair and Attention Span: Fair  Recall:  [employer] of Knowledge: Fair  Language: Fair  Akathisia:  No  Handed:    AIMS (if indicated): not done  Assets:  Engineer, maintenance Social Support  ADL's:  Intact  Cognition: Impaired,  Moderate  Sleep:  Patient is unsure of the amount of hours she sleeps    Screenings: AIMS    Flowsheet Row Clinical Support from 06/24/2023 in [facility]Dis[facility]) [facility]) from [facility] ([facility]) [facility]  AIMS Total Score 0 0 0 0      AUDIT    Flowsheet Row Ad[facility] (Discharged) [facility] Ad[facility] (Discharged) from 07/13/2019 [facility] Admission (Discharged) from 06[facility]29/2019  [facility]  Alcohol Use Disorder Identification Test Final Score (AUDIT) 0 0 0      PHQ2-9    Flowsheet Row Office Visit from 06/09/2022 in [facility] - [facility] Dept Of [place]. [facility] Office Visit from 06/21/2021 in [facility] - A Dept Of [place]. [facility] Office Visit from 10/20/2019 in [facility] - A Dept Of [place]. [facility] ED from 09/18/2019 in [facility] Emergency Department at [facility] Office Visit from 07/11/2019 in [facility] - A Dept Of [place]. [facility]  PHQ-2 Total Score 0 0 3 4 0  PHQ-9 Total Score 0 -- 11 13 --      Flowsheet Row Clinical Support from 06/24/2023 in [facility] Clinical Support from 04/22/2023 in [facility] Clinical Support from  01/14/2023 in [facility]  C-SSRS RISK CATEGORY Moderate Risk Moderate Risk Moderate Risk        Assessment and Plan:   Patricia [name] is a 33 year old, African-American female with a past psychiatric history significant for adjustment disorder and bipolar disorder who presents to [facility], accompanied by her social worker Patricia [name]). Patient presents today for follow-up regarding her medication.  Interpretive services were utilized during the duration of the encounter.   Patient presents to the encounter stating that she has been taking her medications regularly.  She denies experiencing any adverse side effects at this time.  Patient denies experiencing any involuntary movements associated with her use of risperidone .  An aims assessment was performed with the patient scoring a 0.  Patient denies overt depressive symptoms nor does she endorse anxiety.  Patient does not appear to be responding to internal/external stimuli at this time.  Patient endorses stability on her current medication regimen and would like to continue taking her medication as prescribed.  Patient's medication to be e-prescribed to pharmacy of choice.  A [place] Suicide Severity Rating Scale was performed with the patient being considered moderate risk.  Patient denies suicidal ideations and is able to contract for safety following the conclusion of the encounter.  Patient is currently being seen by primary care provider.  Patient had labs obtained on 06/08/2023.  Patient's hemoglobin A1c was within normal limits.  Patient's comprehensive metabolic panel was significant for elevated chloride (107 mmol/L) and decreased CO2 (18 mmol/L).  Patient's complete blood count with differential was significant for decreased MCH (26.4 pg).  Patient's lipid profile was within normal limits.  Collaboration of Care: Collaboration of Care: Medication Management AEB  provider managing patient's psychiatric medications and Psychiatrist AEB patient being followed by a mental health provider  Patient/Guardian was advised Release of Information must be obtained prior to any record release in order to collaborate their care with an outside provider. Patient/Guardian was advised if they have not already done so to contact the registration department to sign all necessary forms in order for us  to release information regarding their care.   Consent: Patient/Guardian gives verbal consent for treatment and assignment of benefits for services provided during this visit. Patient/Guardian expressed understanding and agreed to proceed.   1. Bipolar affective disorder, current episode mixed, current episode severity unspecified (HCC) (Primary)  - risperiDONE  (RISPERDAL ) 2 MG tablet; Take 1 tablet (2 mg total) by mouth 2 (two) times daily. Take 1 tablet  in the morning and take 2 tablets in the evening each day  Dispense: 180 tablet; Refill: 1  2. Adjustment disorder with mixed disturbance of emotions and conduct  3. 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

## 2023-07-01 ENCOUNTER — Ambulatory Visit: Payer: MEDICAID | Admitting: Family Medicine

## 2023-07-01 NOTE — Progress Notes (Deleted)
    SUBJECTIVE:   CHIEF COMPLAINT / HPI: Removal and replacement  ***  PERTINENT  PMH / PSH: Nexplanon  in place  OBJECTIVE:   There were no vitals taken for this visit.  ***  ASSESSMENT/PLAN:   Assessment & Plan  No follow-ups on file.  Ivin Marrow, MD [facility]

## 2023-07-14 NOTE — Patient Instructions (Incomplete)
 Below is our plan:  We will continue levetiracetam  1500mg  and lamotrigine  150mg  twice daily.   Please make sure you are consistent with timing of seizure medication. I recommend annual visit with primary care provider (PCP) for complete physical and routine blood work. I recommend daily intake of vitamin D (400-800iu) and calcium (800-1000mg ) for bone health. Discuss Dexa screening with PCP.   Please maintain precautions. Do not participate in activities where a loss of awareness could harm you or someone else. No swimming alone, no tub bathing, no hot tubs, no driving, no operating motorized vehicles (cars, ATVs, motocycles, etc), lawnmowers, power tools or firearms. No standing at heights, such as rooftops, ladders or stairs. Avoid hot objects such as stoves, heaters, open fires. Wear a helmet when riding a bicycle, scooter, skateboard, etc. and avoid areas of traffic. Set your water  heater to 120 degrees or less.  SUDEP is the sudden, unexpected death of someone with epilepsy, who was otherwise healthy. In SUDEP cases, no other cause of death is found when an autopsy is done. Each year, more than 1 in 1,000 people with epilepsy die from SUDEP. This is the leading cause of death in people with uncontrolled seizures. Until further answers are available, the best way to prevent SUDEP is to lower your risk by controlling seizures. Research has found that people with all types of epilepsy that experience convulsive seizures can be at risk.  Please make sure you are staying well hydrated. I recommend 50-60 ounces daily. Well balanced diet and regular exercise encouraged. Consistent sleep schedule with 6-8 hours recommended.   Please continue follow up with care team as directed.   Follow up with me in 1 year   You may receive a survey regarding today's visit. I encourage you to leave honest feed back as I do use this information to improve patient care. Thank you for seeing me today!

## 2023-07-14 NOTE — Progress Notes (Deleted)
 No chief complaint on file.   HISTORY OF PRESENT ILLNESS:  07/14/23 ALL: Patricia [name] returns for follow up. She was last seen 06/2022 and doing well on lamotrigine  150mg  and Keppra  1500mg  BID. Since,   Weight   She continues regular follow up with psychiatry.   07/17/2022 ALL:  Patricia [name] returns for follow up for seizures. She was last seen by Dr Samara Crest 12/2021. No evidence of seizure activity but she reported unexplained falls when sitting. EEG was ordered and was normal. Lamictal  was increased to 150mg  BID and Keppra  1500mg  BID continued. Since, she reports doing well. She tells our CMA that she had a seizure yesterday. No family here to confirm. Ernestina Headland, Child psychotherapist, does not know of any concerns of seizure. She was not taken to ER. She seems to be tolerating meds well.   She has lost about 14lbs since visit with Dr Samara Crest. Unclear if she has is trying to lose weight. She reports no difficulty getting food. She tells me her appetite is good . She does have intermittent stomach pains but unable to tell me what it feels like. She denies vomiting. She is followed regularly by PCP and psychiatry.   Interpreter on the phone  12/27/21 [facility]: Patient presents today for follow-up, she is accompanied by her guardian Ernestina Headland and Contractor.  She is on Lamictal  150 mg daily, Keppra  1500 mg twice daily for his seizures.  Patient has been reporting lately that she has been falling, with no cause.  These falls happened when she is sitting down.  She cannot think of anything that brings them on.  I spoke with her mother over the phone and she has reported the fall while sitting down.  She reports patient had one fall.  She is compliant with her medications.  Denies any generalized tonic-clonic activity.  11/05/2020 ALL: Patricia [name] returns for follow up for seizures. She was last seen by Dr Tilda Fogo 04/2020 and continued on levetiracetam  1500mg  BID and lamotrigine  was added (had not received rx started with me in 02/2020).  She was advised to titrate lamotrigine  to 75mg  BID then call for new dose of 100mg  BID. She was seen in the ER 07/13/2020 for behavioral concerns and reportedly not compliant with Risperdal , levetiracetam  and lamotrigine . Meds were restarted. She has continued levetiracetam  1500mg  BID and lamotrigine  50mg  daily (dose prescribed by psychiatry).   She presents with her mother and interpreter who aid in history. Since seeing Dr Tilda Fogo, she has had three seizures. All have been in the setting of missed AED doses for several days. Her mother is now overseeing administration of medications and she denies recently missed doses. Medications are pill packed and mailed to family via [employer] Upon review of individual bubble pack, it is noted that she has three lamotrigine  tablets for Thursday and only one on Friday.   She continues close follow up with Jacqulynn Maxin, PA with psychiatry. Last seen 10/25/2020 and next appt 12/28/2020. She is also followed by Dr Bevin Bucks, PCP.  05/22/2020 KW: Ms. Koppel is a 33 year old left-handed black female with a history of seizures.  The patient has had poorly controlled seizures, in the month of February she had at least 4 seizures.  She has been on Keppra  with an increase in the dose to 1500 mg twice daily.  Lamotrigine  was recommended, but the patient apparently never got the prescription.  She will have episodes of behavior where she will try to run away from her home, she was seen  in the emergency room on 14 May 2020 when she tried to run away, the police were able to find her and they got her to the emergency room.  Blood work was done at that time.  The patient has not apparently had any increase in anxiety or agitation with the increase in the Keppra  level according to the family.  This behavior has been present for years.  Her typical seizures are associated with dystonic posturing and spinning and then a generalized seizure.  The patient comes to the office today  for further evaluation.  03/07/2020 ALL:  She returns today for seizure follow up. We increased levetiracetam  to 1500mg  BID in 02/09/2020 (pateint most likely started increased dose 12/19 with congregational nurse visit) due to continued reports of seizures. Per last note from congregational nurse, she last had a seizure on 12/28.  Her sister-in-law presents with her today and states that patient's last seizure was actually yesterday, 03/06/2020.  She has not notified the congregational nurse of this event.  She reports that Patricia [name] suddenly started clearing her throat/coughing then started to pull her hair with right hand. Head, neck and eyes were deviated to left. Patricia [name] was unable to communicate and then fell to the ground. Event lasted a few minutes and she was back to baseline within 5 minutes. She did have urinary incontinence but no tongue injury, no tonic clonic movements. Event was very similar to previous reports of seizure activity. Sister in law reports being present with medication administration and with the exception of 1 dose, she has taken (3) 500mg  tablets twice daily since 12/19. She has had 2-3 seizures since increased dose.   She has had significant behavioral concerns. On 1/2, she ran out in the road at night without clothes. Per sister in law repots, she seems to have erratic/uncontrollable behavior periodically but consistently worse on the weekends when Tauni's mother is home. She is not followed by psychiatry. She is followed closely by Dr Bevin Bucks, PCP.   01/17/2020 ALL: Patricia [name] is a 33 y.o. female here today for follow up for questionable breakthrough seizures. She presents with her brother and sister in law who aid in history with the assistance of interpreter. She was last seen in 09/2019 when she was reportedly doing well. No seizures at that time. Her brother reports that over the past two weeks she has had 3 seizures. With event, she will have urinary incontinence and then  stare off into space. No tonic clonic movements but is not responsive. She has collapsed once. She has had some behavioral concerns as well with leaving the home without a supervisor. She has a caregiver 24 hours a day. She is working with mental health provider per her brother. She eats and drinks well but prefers mostly fruits and drinks soda and juice. Very little water .   10/10/2019 ALL: Patricia [name] is a 33 y.o. female here today for follow up. She presents today with her brother and sister in law who aid in history. She continues levetiracetam  500mg  twice daily. Family reports that she is doing well on this medication. They denies recent seizure activity. She is followed by Dr Otho Blitz, PCP. Family reports that they have not yet received refill sent by Dr Bevin Bucks on 8/12. Her brother will go by pharmacy today to inquire. Patricia [name] reports feeling well. No concerns. No pain. She is cognitively impaired but able to communicate with me today via interpretor.    Jocelyne, interpreter, assists with visit. Patient  speaks Swahili.   REVIEW OF SYSTEMS: Out of a complete 14 system review of symptoms, the patient complains only of the following symptoms, seizures, behavioral concerns and all other reviewed systems are negative.   ALLERGIES: No Known Allergies   HOME MEDICATIONS: Outpatient Medications Prior to Visit  Medication Sig Dispense Refill   hydrocortisone  2.5 % ointment Apply topically daily as needed. 30 g 0   lamoTRIgine  (LAMICTAL ) 150 MG tablet TAKE ONE TABLET BY MOUTH TWICE A DAY 60 tablet 10   levETIRAcetam  (KEPPRA ) 500 MG tablet TAKE THREE TABLETS BY MOUTH TWICE A DAY 180 tablet 2   risperiDONE  (RISPERDAL ) 2 MG tablet Take 1 tablet (2 mg total) by mouth 2 (two) times daily. Take 1 tablet  in the morning and take 2 tablets in the evening each day 180 tablet 1   No facility-administered medications prior to visit.     PAST MEDICAL HISTORY: Past Medical History:  Diagnosis Date    Bipolar 1 disorder (HCC)    Cerebral palsy (HCC)    Seizures (HCC)    Suicidal behavior      PAST SURGICAL HISTORY: No past surgical history on file.   FAMILY HISTORY: Family History  Problem Relation Age of Onset   Hepatitis Brother      SOCIAL HISTORY: Social History   Socioeconomic History   Marital status: Single    Spouse name: Not on file   Number of children: 1   Years of education: Not on file   Highest education level: Not on file  Occupational History    Comment: none  Tobacco Use   Smoking status: Never   Smokeless tobacco: Never  Vaping Use   Vaping status: Never Used  Substance and Sexual Activity   Alcohol use: Not Currently    Alcohol/week: 2.0 standard drinks of alcohol    Types: 2 Cans of beer per week    Comment: occasional alcohol use   Drug use: Never   Sexual activity: Not Currently  Other Topics Concern   Not on file  Social History Narrative   Lives with mother, family   Social Drivers of Corporate investment banker Strain: Not on file  Food Insecurity: Not on file  Transportation Needs: Not on file  Physical Activity: Not on file  Stress: Not on file  Social Connections: Not on file  Intimate Partner Violence: Not on file      PHYSICAL EXAM  There were no vitals filed for this visit.    There is no height or weight on file to calculate BMI.   Generalized: Well developed, in no acute distress  Cardiology: normal rate and rhythm, no murmur auscultated  Respiratory: clear to auscultation bilaterally    Neurological examination  Mentation: Alert, not oriented to time, place, or history taking. Follows most commands speech and language fluent, developmentally delayed, laughs though out visit, repetitive in yes/no answers Cranial nerve II-XII: Pupils were equal round reactive to light. Extraocular movements were full, visual field were full on confrontational test. Facial sensation and strength were normal. Head turning  and shoulder shrug  were normal and symmetric. Motor: The motor testing reveals 5 over 5 strength of all 4 extremities. Good symmetric motor tone is noted throughout.  Sensory: Sensory testing is intact to soft touch on all 4 extremities. No evidence of extinction is noted.  Coordination: she is unable to follow instructions Gait and station: Gait is wide  DIAGNOSTIC DATA (LABS, IMAGING, TESTING) - I reviewed patient records,  labs, notes, testing and imaging myself where available.  Lab Results  Component Value Date   WBC 5.8 06/08/2023   HGB 12.3 06/08/2023   HCT 39.1 06/08/2023   MCV 84 06/08/2023   PLT 218 06/08/2023      Component Value Date/Time   NA 141 06/08/2023 1214   K 4.2 06/08/2023 1214   CL 107 (H) 06/08/2023 1214   CO2 18 (L) 06/08/2023 1214   GLUCOSE 83 06/08/2023 1214   GLUCOSE 130 (H) 08/23/2022 0018   BUN 11 06/08/2023 1214   CREATININE 0.87 06/08/2023 1214   CALCIUM 9.2 06/08/2023 1214   PROT 7.3 06/08/2023 1214   ALBUMIN 4.5 06/08/2023 1214   AST 16 06/08/2023 1214   ALT 19 06/08/2023 1214   ALKPHOS 83 06/08/2023 1214   BILITOT 0.2 06/08/2023 1214   GFRNONAA >60 08/23/2022 0000   GFRAA 102 03/07/2020 0957   Lab Results  Component Value Date   CHOL 140 06/08/2023   HDL 57 06/08/2023   LDLCALC 73 06/08/2023   TRIG 44 06/08/2023   CHOLHDL 2.5 06/08/2023   Lab Results  Component Value Date   HGBA1C 5.3 06/08/2023   No results found for: "VITAMINB12" Lab Results  Component Value Date   TSH 1.312 06/30/2019      ASSESSMENT AND PLAN  33 y.o. year old female  has a past medical history of Bipolar 1 disorder (HCC), Cerebral palsy (HCC), Seizures (HCC), and Suicidal behavior. here with   Seizure disorder [facility])  Cognitive developmental delay  Other cerebral palsy (HCC)  Adjustment disorder with mixed disturbance of emotions and conduct  Language barrier   Patricia [name] seems to be doing well on lamotrigine  150mg  and levetiracetam  1500mg  BID. We  will continue current treatment plan. Labs from ER visit 05/2022 reviewed for today's visit. Ernestina Headland will help relay plan to family. She will continue close follow up with her PCP. I will have her follow up in 1 year, sooner if needed. Ernestina Headland verbalizes understanding and agreement with this plan.   Terrilyn Fick, MSN, FNP-C 07/14/2023, 9:58 AM  [facility] [address], [address] [place], [place] [address] [phone]

## 2023-07-15 ENCOUNTER — Ambulatory Visit: Payer: MEDICAID | Admitting: Family Medicine

## 2023-07-23 ENCOUNTER — Other Ambulatory Visit: Payer: Self-pay | Admitting: *Deleted

## 2023-07-23 MED ORDER — LEVETIRACETAM 500 MG PO TABS: ORAL_TABLET | ORAL | 2 refills | Status: DC

## 2023-08-04 ENCOUNTER — Telehealth: Payer: Self-pay

## 2023-08-04 NOTE — Telephone Encounter (Signed)
 Sister in law called me with questions regarding vaccinations that are required for immigration purposes. I have called [facility] and informed some vaccines need a prescription. Patient has a prescription and was advised to present herself as a walk in.  Perla Bradford Araceli Coufal RN BSN PCCN  [facility] [phone]-cell [phone]-office

## 2023-08-05 ENCOUNTER — Telehealth: Payer: Self-pay

## 2023-08-05 ENCOUNTER — Other Ambulatory Visit (HOSPITAL_BASED_OUTPATIENT_CLINIC_OR_DEPARTMENT_OTHER): Payer: Self-pay

## 2023-08-05 MED ORDER — BOOSTRIX 5-2.5-18.5 LF-MCG/0.5 IM SUSY
0.5000 mL | PREFILLED_SYRINGE | Freq: Once | INTRAMUSCULAR | 0 refills | Status: AC
  Filled 2023-08-05: qty 0.5, 1d supply, fill #0

## 2023-08-05 NOTE — Telephone Encounter (Signed)
 Returned TC to pt about BC questions, no answer, VM not set up.

## 2023-08-06 ENCOUNTER — Encounter: Payer: Self-pay | Admitting: Family Medicine

## 2023-08-06 ENCOUNTER — Ambulatory Visit: Payer: Self-pay

## 2023-08-06 NOTE — Telephone Encounter (Signed)
Opened in error.   Ed Mandich C Nyellie Yetter, RN  

## 2023-08-06 NOTE — Congregational Nurse Program (Signed)
  Dept: [phone]   Congregational Nurse Program Note  Date of Encounter: 08/06/2023  Past Medical History: Past Medical History:  Diagnosis Date   Bipolar 1 disorder (HCC)    Cerebral palsy (HCC)    Seizures (HCC)    Suicidal behavior     Encounter Details:  Community Questionnaire - 08/06/23 0807       Questionnaire   Ask client: Do you give verbal consent for me to treat you today? N/A   Pt has a English as a second language teacher N/A    Location Patient Served  [facility]    Encounter Setting Phone/Text/Email    Population Status Migrant/Refugee    Insurance Medicaid    Insurance/Financial Assistance Referral N/A    Medication Have Medication Insecurities    Medical Provider Yes    Screening Referrals Made N/A    Medical Referrals Made N/A    Medical Appointment Completed [facility];Dental    CNP Interventions Advocate/Support;Navigate Healthcare System;Case Management;Counsel    Screenings CN Performed N/A    ED Visit Averted N/A    Life-Saving Intervention Made N/A        -Family informed that vaccination appointment is scheduled at 1030 am today. Transportation assistance provided. -Legal Guardian attempted to make appointment for contraception at [facility]. Legal Guardian informed that patient usually gets this done at [facility].  Perla Bradford Brinson Tozzi RN BSN PCCN  [facility] [phone]-cell [phone]-office

## 2023-08-10 ENCOUNTER — Ambulatory Visit: Payer: MEDICAID

## 2023-08-14 ENCOUNTER — Ambulatory Visit: Payer: Self-pay | Admitting: Student

## 2023-08-14 ENCOUNTER — Encounter: Payer: Self-pay | Admitting: Student

## 2023-08-14 ENCOUNTER — Ambulatory Visit (INDEPENDENT_AMBULATORY_CARE_PROVIDER_SITE_OTHER): Payer: MEDICAID | Admitting: Student

## 2023-08-14 VITALS — BP 112/69 | HR 86 | Ht 59.0 in | Wt 176.4 lb

## 2023-08-14 DIAGNOSIS — Z309 Encounter for contraceptive management, unspecified: Secondary | ICD-10-CM | POA: Insufficient documentation

## 2023-08-14 DIAGNOSIS — Z3046 Encounter for surveillance of implantable subdermal contraceptive: Secondary | ICD-10-CM | POA: Diagnosis not present

## 2023-08-14 LAB — POCT URINE PREGNANCY: Preg Test, Ur: NEGATIVE

## 2023-08-14 MED ORDER — ETONOGESTREL 68 MG ~~LOC~~ IMPL
68.0000 mg | DRUG_IMPLANT | Freq: Once | SUBCUTANEOUS | Status: AC
Start: 2023-08-14 — End: 2023-08-14
  Administered 2023-08-14: 68 mg via SUBCUTANEOUS

## 2023-08-14 NOTE — Progress Notes (Cosign Needed Addendum)
  SUBJECTIVE:   CHIEF COMPLAINT / HPI:   Nexplanon  replacement  -placed Aug 2021, patient would like it replaced.  Unsure of last period  PERTINENT  PMH / PSH:   OBJECTIVE:  BP 112/69   Pulse 86   Ht 4' 11 (1.499 m)   Wt 176 lb 6.4 oz (80 kg)   SpO2 100%   BMI 35.63 kg/m  Physical Exam Constitutional:      General: She is not in acute distress.    Appearance: Normal appearance. She is not ill-appearing.  Pulmonary:     Effort: Pulmonary effort is normal. No respiratory distress.   Neurological:     Mental Status: She is alert.   Psychiatric:        Mood and Affect: Mood normal.        Behavior: Behavior normal.      ASSESSMENT/PLAN:   Assessment & Plan Encounter for contraceptive management, unspecified type GYNECOLOGY PROCEDURE NOTE  Patricia [name] is a 33 y.o. G1P1001 here for Nexplanon  removal and insertion. No other gynecologic concerns.     Nexplanon  Removal and Insertion  Patient identified, informed consent performed, consent signed. Urine pregnancy test negative.  Patient does understand that irregular bleeding is a very common side effect of this medication. She was advised to have backup contraception for one week after replacement of the implant. Pregnancy test in clinic today was negative.  Appropriate time out taken. Implanon  site identified. Area prepped in usual sterile fashon. One ml of 1% lidocaine  was used to anesthetize the area at the distal end of the implant. A small stab incision was made right beside the implant on the distal portion. The Nexplanon  rod was grasped using hemostats and removed without difficulty. There was minimal blood loss. There were no complications. Area was then injected with 3 ml of 1 % lidocaine . She was re-prepped with betadine, Nexplanon  removed from packaging, Device confirmed in needle, then inserted full length of needle and withdrawn per handbook instructions. Nexplanon  was able to palpated in the patient's arm;  patient palpated the insert herself.  There was minimal blood loss. Patient insertion site covered with guaze and a pressure bandage to reduce any bruising. The patient tolerated the procedure well and was given post procedure instructions.  She was advised to have backup contraception for one week.    Wilhemena Harbour, MD 08/14/2023, 4:54 PM PGY-3, [facility]  No follow-ups on file. Wilhemena Harbour, MD 08/14/2023, 4:15 PM PGY-3, [facility]

## 2023-08-14 NOTE — Assessment & Plan Note (Addendum)
 GYNECOLOGY PROCEDURE NOTE  Patricia [name] is a 33 y.o. G1P1001 here for Nexplanon  removal and insertion. No other gynecologic concerns.     Nexplanon  Removal and Insertion  Patient identified, informed consent performed, consent signed. Urine pregnancy test negative.  Patient does understand that irregular bleeding is a very common side effect of this medication. She was advised to have backup contraception for one week after replacement of the implant. Pregnancy test in clinic today was negative.  Appropriate time out taken. Implanon  site identified. Area prepped in usual sterile fashon. One ml of 1% lidocaine  was used to anesthetize the area at the distal end of the implant. A small stab incision was made right beside the implant on the distal portion. The Nexplanon  rod was grasped using hemostats and removed without difficulty. There was minimal blood loss. There were no complications. Area was then injected with 3 ml of 1 % lidocaine . She was re-prepped with betadine, Nexplanon  removed from packaging, Device confirmed in needle, then inserted full length of needle and withdrawn per handbook instructions. Nexplanon  was able to palpated in the patient's arm; patient palpated the insert herself.  There was minimal blood loss. Patient insertion site covered with guaze and a pressure bandage to reduce any bruising. The patient tolerated the procedure well and was given post procedure instructions.  She was advised to have backup contraception for one week.    Wilhemena Harbour, MD 08/14/2023, 4:54 PM PGY-3, [facility]

## 2023-08-14 NOTE — Patient Instructions (Addendum)
 It was great to see you! Thank you for allowing me to participate in your care!  I recommend that you always bring your medications to each appointment as this makes it easy to ensure we are on the correct medications and helps us  not miss when refills are needed.  Our plans for today:  - Nexplanon  replacement Today we replaced your Nexplanon  for birthcontrol. It will take 1 week to start working, so use condoms or other forms of contraceptives until that time.   This is good for 3 years, thus you will need to replace it in 2028.  Wound care  Keep the wound dry for 48 hours  You can then bathe, but should keep the wrap on for 4 days.   Make a follow up appointment if you develop swelling, pain, redness in your arm, or fever.   Take care and seek immediate care sooner if you develop any concerns.   Dr. Wilhemena Harbour, MD [facility]    [facility] nzuri Scott! Asante kwa kuniruhusu kushiriki Meridee Standing wako!  Ninapendekeza ulete dawa zako kila mara kwa kila miadi kwani [place] [employer] kuwa tunatumia dawa sahihi na hutusaidia tusikose wakati kujaza kunapohitajika.  Mipango yetu ya leo:  - Uingizwaji wa Nexplanon  Veryl Gottron tumebadilisha Nexplanon  yako kwa udhibiti wa uzazi. [place] wiki 1 [place] hivyo tumia kondomu au njia zingine za uzazi wa mpango hadi [place].   Hii ni nzuri kwa miaka 3, kwa hivyo utahitaji [place] mnamo 2028.  Thor Fling  Weka Dionicia Frater kwa masaa [address] [place], lakini unapaswa kuweka kitambaa kwa siku 4.   Fanya miadi ya kufuatilia ikiwa utapata uvimbe, maumivu, uwekundu [employer], au [place].   Jihadharini na utafute huduma ya haraka mapema ikiwa unapata wasiwasi wowote.   Dk. Wilhemena Harbour, MD [facility]

## 2023-08-19 ENCOUNTER — Ambulatory Visit (INDEPENDENT_AMBULATORY_CARE_PROVIDER_SITE_OTHER): Payer: MEDICAID | Admitting: Physician Assistant

## 2023-08-19 VITALS — BP 123/85 | HR 88 | Temp 99.3°F | Ht 59.0 in | Wt 171.6 lb

## 2023-08-19 DIAGNOSIS — Z79899 Other long term (current) drug therapy: Secondary | ICD-10-CM

## 2023-08-19 DIAGNOSIS — F02B18 Dementia in other diseases classified elsewhere, moderate, with other behavioral disturbance: Secondary | ICD-10-CM

## 2023-08-19 DIAGNOSIS — F4325 Adjustment disorder with mixed disturbance of emotions and conduct: Secondary | ICD-10-CM

## 2023-08-19 DIAGNOSIS — F3160 Bipolar disorder, current episode mixed, unspecified: Secondary | ICD-10-CM

## 2023-08-19 MED ORDER — RISPERIDONE 4 MG PO TABS: 4.0000 mg | ORAL_TABLET | Freq: Two times a day (BID) | ORAL | 1 refills | Status: DC

## 2023-08-20 ENCOUNTER — Encounter (HOSPITAL_COMMUNITY): Payer: Self-pay | Admitting: Physician Assistant

## 2023-08-20 NOTE — Progress Notes (Signed)
 [facility] MD/PA/NP OP Progress Note  08/19/2023 9:13 PM Patricia [name]  MRN:  [id number]  Chief Complaint:  Chief Complaint  Patient presents with   Follow-up   Medication Management   HPI:   Patricia [name] is a 33 year old, African-American female with a past psychiatric history significant for adjustment disorder and bipolar disorder who presents to [facility], accompanied by her social worker Bernerd). Patient presents today for follow-up regarding her medication.  Interpretive services were utilized during the duration of the encounter.  Patient is currently being managed on the following medications: Risperdal  2 mg in the morning/4 mg at bedtime  Patient presents to the encounter stating that she is fine and has no concerns today.  She reports that she has been taking her medication regularly.  Patient denies experiencing any adverse side effects with her use of risperidone .  She further denies experiencing involuntary movements from her use of risperidone .  Patient denies depression nor does she experience nervousness/restlessness.  Patient denies exhibiting any aggressive behavior.  She further denies auditory or visual hallucinations or paranoia. Patient is unable to perform a GAD-7 or PHQ-9 screen.  An aims assessment was performed with the patient scoring a 0.  Provider asked the patient about her home environment to which she replied that her home environment is fine. Prior to the conclusion of the encounter, patient expressed to provider that she wanted to harm herself because her family calls her names and insults her.  Patient's social worker was able to contact the patient's sister so that she could provide collateral over the patient's behavior.  Patient's sister informed provider that the patient was talking a lot this morning and making insults towards her sister stating that she smells bad.  Per patient's sister, patient is always looking for a  problem and causing fights.  She reports that the patient's risperidone  is helpful in calming the patient down.  She reports that once the patient is calm, she often apologizes for her previous actions.  She believes that the patient's medication needs to be adjusted so that she is more calm and able to be more managed in the home.  Patient is calm, cooperative, and engaged in conversation during the encounter.  Patient endorses okay mood.  Patient exhibits euthymic mood with appropriate affect.  Despite patient expressing wanting to harm herself earlier in the encounter, patient denies active suicidal or homicidal ideations.  She further denies auditory or visual hallucinations and does not appear to be responding to internal/external stimuli.  Patient endorses good sleep.  In regards to appetite, patient reports that she is not as hungry but will eat when given food.  Patient denies alcohol consumption, tobacco use, or illicit drug use.  Visit Diagnosis:    ICD-10-CM   1. Adjustment disorder with mixed disturbance of emotions and conduct  F43.25     2. Bipolar affective disorder, current episode mixed, current episode severity unspecified (HCC)  F31.60 risperidone  (RISPERDAL ) 4 MG tablet    3. 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 No pertinent surgical history.  Family Psychiatric History:  Per patient's sister, they have a younger sister with a disability but it is not psychiatric in nature    Family History:  Family History  Problem Relation Age of Onset   Hepatitis Brother      Social History:  Social History   Socioeconomic History   Marital status: Single    Spouse name: Not on file   Number of children: 1   Years of education: Not on file   Highest education level: Not on file  Occupational History    Comment: none  Tobacco Use   Smoking status: Never   Smokeless tobacco: Never  Vaping Use   Vaping status: Never Used  Substance and Sexual Activity   Alcohol use: Not Currently    Alcohol/week: 2.0 standard drinks of alcohol    Types: 2 Cans of beer per week    Comment: occasional alcohol use   Drug use: Never   Sexual activity: Not Currently  Other Topics Concern   Not on file  Social History Narrative   Lives with mother, family   Social Drivers of Health   Financial Resource Strain: Not on file  Food Insecurity: Not on file  Transportation Needs: Not on file  Physical Activity: Not on file  Stress: Not on file  Social Connections: Not on file    Allergies: No Known Allergies  Metabolic Disorder Labs: Lab Results  Component Value Date   HGBA1C 5.3 06/08/2023   MPG 105.41 06/30/2019   No results found for: PROLACTIN Lab Results  Component Value Date   CHOL 140 06/08/2023   TRIG 44 06/08/2023   HDL 57 06/08/2023   CHOLHDL 2.5 06/08/2023   VLDL 11 06/30/2019   LDLCALC 73 06/08/2023   LDLCALC 69 03/20/2021   Lab Results  Component Value Date   TSH 1.312 06/30/2019   TSH 1.780 08/04/2017    Therapeutic Level Labs: No results found for: LITHIUM No results found for: VALPROATE No results found for: CBMZ  Current Medications: Current Outpatient Medications  Medication Sig Dispense Refill   hydrocortisone  2.5 % ointment Apply topically daily as needed. 30 g 0   lamoTRIgine  (LAMICTAL ) 150 MG tablet TAKE ONE TABLET BY MOUTH TWICE A DAY 60 tablet 10   levETIRAcetam  (KEPPRA ) 500 MG tablet TAKE THREE TABLETS BY MOUTH TWICE A DAY 180 tablet 2   risperidone  (RISPERDAL ) 4 MG tablet Take 1 tablet (4 mg total) by mouth  2 (two) times daily. Take 1 tablet in the morning and take 1 tablet in the evening each day 60 tablet 1   No current facility-administered medications for this visit.     Musculoskeletal: Strength & Muscle Tone: within normal limits Gait & Station: normal Patient leans: N/A  Psychiatric Specialty Exam: Review of Systems  Psychiatric/Behavioral:  Negative for decreased concentration, dysphoric mood, hallucinations, self-injury, sleep disturbance and suicidal ideas. The patient is not nervous/anxious and is not hyperactive.     Blood pressure 123/85, pulse 88, temperature 99.3 F (37.4 C), temperature source Oral, height 4' 11 (1.499 m), weight 171 lb 9.6 oz (77.8 kg), SpO2 97%.Body mass index is 34.66 kg/m.  General Appearance: Casual  Eye Contact:  Fair  Speech:  Clear and Coherent  Volume:  Normal  Mood:  Euthymic  Affect:  Appropriate  Thought Process:  Linear  Orientation:  Full (Time, Place, and Person)  Thought Content: WDL   Suicidal Thoughts:  No  Homicidal Thoughts:  No  Memory:  Immediate;   Fair  Recent;   Fair Remote;   Poor  Judgement:  Poor  Insight:  Lacking  Psychomotor Activity:  Normal  Concentration:  Concentration: Fair and Attention Span: Fair  Recall:  [employer] of Knowledge: Fair  Language: Fair  Akathisia:  No  Handed:    AIMS (if indicated): not done  Assets:  Engineer, maintenance Social Support  ADL's:  Intact  Cognition: Impaired,  Moderate  Sleep:  Patient is unsure of the amount of hours she sleeps    Screenings: AIMS    Flowsheet Row Clinical Support from 08/19/2023 in [facility] Clinical Support from 06[facility]24/2023 in [facility] ([facility] ([facility]) from [facility] (Discharged) [facility]   AIMS Total Score 0 0 0 0 0   AUDIT    Flowsheet Row Ad[facility] ([facility]charged) [facility] Admission (Discharged) from 07/13/2019 [facility] Admission (Discharged) from [facility]  Alcohol Use Disorder Identification Test Final Score (AUDIT) 0 0 0   PHQ2-9    Flowsheet Row Office Visit from 06/09/2022 in [facility] [place]. [facility] Office Visit from 06/21/2021 in [facility] Of [place] [facility]. [facility] Office Visit from 10/20/2019 in [facility] Of [place] [facility] ED from 09/18/2019 in [facility] Emergency Department at [facility] Office Visit from 07/11/2019 in [facility] - [facility] Dept Of [place]. [facility]  PHQ-2 Total Score 0 0 3 4 0  PHQ-9 Total Score 0 -- 11 13 --   Flowsheet Row Clinical Support from 08/19/2023 in [facility] Clinical Support from 06/24/2023 in [facility] Clinical Support from 04/22/2023 in [facility]  C-SSRS RISK CATEGORY High Risk Moderate Risk Moderate Risk     Assessment and Plan:   Patricia [name] is a 33 year old, African-American female with a past psychiatric history significant for adjustment disorder and bipolar disorder who presents to [facility], accompanied by her social worker Bernerd). Patient presents today for follow-up regarding her medication.  Interpretive services were utilized during the duration of the encounter.  Patient presents to the encounter stating that she is taking her risperidone  regularly.  She denied experiencing any adverse side effects from the use of her risperidone .  An aims assessment was performed with the patient scoring of 0.  Patient denies  experiencing any changes in her behavior.  She further denies auditory or visual hallucinations or paranoia.  During the assessment, patient expressed to provider that she occasionally has thoughts of wanting to harm herself due to her family.  She informed provider that her family insults her, called her names, and gives her a hard time.  Provider was able to obtain collateral from patient's sister via phone call.  Patient's sister informed provider that patient continues to start arguments and fights in the home.  She reports that when the patient does take her medication, she is able to calm down and occasionally apologizes for her previous actions.  Despite her medication being helpful in managing her mood, patient's sister believes that the patient's medication needs  to be further adjusted so that she is more calm and agreeable.  Provider discussed with patient the concerns that her sister has with her behavior in the home.  Provider recommended adjusting patient's risperidone  dosage from 2 mg in the morning/4 mg at bedtime to 4 mg in the morning and 4 mg at bedtime.  Patient was agreeable to adjustment.  Patient's medication to be prescribed to pharmacy of choice.  Patient labs are up to date.  Provider to refer patient to cardiology for up-to-date EKG.  Most recent EKG was obtained on 08/25/2022.  A [place] Suicide Severity Rating Scale was performed with the patient being considered high risk.  Following the conclusion of the encounter, patient denies being a danger to herself and is able to contract for safety.  - Provider provided information for the 988 Suicide and Crisis Lifeline - Patient was instructed to present to [facility]  in case of a mental health crisis - Patient was instructed to call 911 during the event of a mental health crisis.  Collaboration of Care: Collaboration of Care: Medication Management AEB provider managing patient's psychiatric  medications and Psychiatrist AEB patient being followed by a mental health provider  Patient/Guardian was advised Release of Information must be obtained prior to any record release in order to collaborate their care with an outside provider. Patient/Guardian was advised if they have not already done so to contact the registration department to sign all necessary forms in order for us  to release information regarding their care.   Consent: Patient/Guardian gives verbal consent for treatment and assignment of benefits for services provided during this visit. Patient/Guardian expressed understanding and agreed to proceed.   1. Bipolar affective disorder, current episode mixed, current episode severity unspecified (HCC)  - risperidone  (RISPERDAL ) 4 MG tablet; Take 1 tablet (4 mg total) by mouth 2 (two) times daily. Take 1 tablet in the morning and take 1 tablet in the evening each day  Dispense: 60 tablet; Refill: 1  2. Adjustment disorder with mixed disturbance of emotions and conduct (Primary)  3. 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 Long term current use of antipsychotic medication  - Ambulatory referral to Cardiology  Patient to follow-up in 2 months Provider spent a total of 40 minutes with the patient/reviewing patient's chart  Reginia FORBES Bolster, PA 08/19/2023, 9:13 PM

## 2023-08-21 ENCOUNTER — Telehealth (HOSPITAL_COMMUNITY): Payer: Self-pay

## 2023-08-21 NOTE — Telephone Encounter (Signed)
 PA was started and approved for patients Risperidone  4mg  tablets until 02/20/2024.   JNL

## 2023-08-24 NOTE — Telephone Encounter (Signed)
Message acknowledge and reviewed.

## 2023-10-16 ENCOUNTER — Ambulatory Visit (INDEPENDENT_AMBULATORY_CARE_PROVIDER_SITE_OTHER): Payer: MEDICAID | Admitting: Physician Assistant

## 2023-10-16 ENCOUNTER — Encounter (HOSPITAL_COMMUNITY): Payer: Self-pay | Admitting: Physician Assistant

## 2023-10-16 VITALS — BP 103/70 | HR 71 | Ht 59.0 in | Wt 175.4 lb

## 2023-10-16 DIAGNOSIS — F3160 Bipolar disorder, current episode mixed, unspecified: Secondary | ICD-10-CM | POA: Diagnosis not present

## 2023-10-16 DIAGNOSIS — F4325 Adjustment disorder with mixed disturbance of emotions and conduct: Secondary | ICD-10-CM | POA: Diagnosis not present

## 2023-10-16 DIAGNOSIS — Z79899 Other long term (current) drug therapy: Secondary | ICD-10-CM | POA: Diagnosis not present

## 2023-10-16 MED ORDER — RISPERIDONE 4 MG PO TABS
4.0000 mg | ORAL_TABLET | Freq: Two times a day (BID) | ORAL | 1 refills | Status: DC
Start: 2023-10-16 — End: 2023-12-23

## 2023-10-16 NOTE — Progress Notes (Signed)
 BH MD/PA/NP OP Progress Note  10/16/2023 10:00 AM Patricia [name]  MRN:  [id number]  Chief Complaint:  Chief Complaint  Patient presents with   Follow-up   Medication Refill   HPI:   Patricia [name] is a 33 year old, African-American female with a past psychiatric history significant for adjustment disorder and bipolar disorder who presents to [facility], accompanied by her social worker Bernerd) and mother. Patient presents today for follow-up regarding her medication.  Interpretive services were utilized during the duration of the encounter.  Patient is currently being managed on the following medications: Risperdal  4 mg in the morning/4 mg at bedtime.  During the assessment, patient's mother states that the patient has been doing good and has no concerns with the patient's behavior at this time.  She denies the patient exhibiting aggressive behavior as of late.  She reports that the patient occasionally does not receive her medication every day but she does take her medication most of the time.  Patient's mother denies the patient getting into any fights in the home which was a previous problem in the past.  Patient denies overt depressive symptoms nor does she endorse anxiety.  Patient denies paranoia characterized by the feeling that people are out to get her.  Patient denies auditory or visual hallucinations.  Patient's mother states that the only issue that the patient has recently had is mocking people in the house.  She reports that the patient has been mostly stable while on her medications. Patient is unable to perform a GAD-7 or PHQ-9 screen.   Patient is calm, cooperative, and engaged in conversation during the encounter.  Patient endorses okay mood.  Patient exhibits euthymic mood with appropriate affect.  Patient denies suicidal or homicidal ideations.  She further denies auditory or visual hallucinations and does not appear to be responding  to internal/external stimuli.  Patient endorses good sleep as well as good appetite.  Patient denies alcohol consumption, tobacco use, or illicit drug use.  Visit Diagnosis:    ICD-10-CM   1. Bipolar affective disorder, current episode mixed, current episode severity unspecified (HCC)  F31.60 risperidone  (RISPERDAL ) 4 MG tablet    2. Long term current use of antipsychotic medication  Z79.899     3. Adjustment disorder with mixed disturbance of emotions and conduct  F43.25       Past Psychiatric History:  Mixed bipolar affective disorder Major neurocognitive disorder Adjustment disorder with mixed disturbance of emotions and conduct  Past Medical History:  Past Medical History:  Diagnosis Date   Bipolar 1 disorder (HCC)    Cerebral palsy (HCC)    Seizures (HCC)    Suicidal behavior    History reviewed. No pertinent surgical history.  Family Psychiatric History:  Per patient's sister, they have a younger sister with a disability but it is not psychiatric in nature    Family History:  Family History  Problem Relation Age of Onset   Hepatitis Brother     Social History:  Social History   Socioeconomic History   Marital status: Single    Spouse name: Not on file   Number of children: 1   Years of education: Not on file   Highest education level: Not on file  Occupational History    Comment: none  Tobacco Use   Smoking status: Never   Smokeless tobacco: Never  Vaping Use   Vaping status: Never Used  Substance and Sexual Activity   Alcohol use: Not Currently  Alcohol/week: 2.0 standard drinks of alcohol    Types: 2 Cans of beer per week    Comment: occasional alcohol use   Drug use: Never   Sexual activity: Not Currently  Other Topics Concern   Not on file  Social History Narrative   Lives with mother, family   Social Drivers of Health   Financial Resource Strain: Not on file  Food Insecurity: Not on file  Transportation Needs: Not on file  Physical  Activity: Not on file  Stress: Not on file  Social Connections: Not on file    Allergies: No Known Allergies  Metabolic Disorder Labs: Lab Results  Component Value Date   HGBA1C 5.3 06/08/2023   MPG 105.41 06/30/2019   No results found for: PROLACTIN Lab Results  Component Value Date   CHOL 140 06/08/2023   TRIG 44 06/08/2023   HDL 57 06/08/2023   CHOLHDL 2.5 06/08/2023   VLDL 11 06/30/2019   LDLCALC 73 06/08/2023   LDLCALC 69 03/20/2021   Lab Results  Component Value Date   TSH 1.312 06/30/2019   TSH 1.780 08/04/2017    Therapeutic Level Labs: No results found for: LITHIUM No results found for: VALPROATE No results found for: CBMZ  Current Medications: Current Outpatient Medications  Medication Sig Dispense Refill   hydrocortisone  2.5 % ointment Apply topically daily as needed. 30 g 0   lamoTRIgine  (LAMICTAL ) 150 MG tablet TAKE ONE TABLET BY MOUTH TWICE A DAY 60 tablet 10   levETIRAcetam  (KEPPRA ) 500 MG tablet TAKE THREE TABLETS BY MOUTH TWICE A DAY 180 tablet 2   risperidone  (RISPERDAL ) 4 MG tablet Take 1 tablet (4 mg total) by mouth 2 (two) times daily. Take 1 tablet in the morning and take 1 tablet in the evening each day 60 tablet 1   No current facility-administered medications for this visit.     Musculoskeletal: Strength & Muscle Tone: within normal limits Gait & Station: normal Patient leans: N/A  Psychiatric Specialty Exam: Review of Systems  Psychiatric/Behavioral:  Negative for decreased concentration, dysphoric mood, hallucinations, self-injury, sleep disturbance and suicidal ideas. The patient is not nervous/anxious and is not hyperactive.     Blood pressure 103/70, pulse 71, height 4' 11 (1.499 m), weight 175 lb 6.4 oz (79.6 kg), SpO2 100%.Body mass index is 35.43 kg/m.  General Appearance: Casual  Eye Contact:  Fair  Speech:  Clear and Coherent  Volume:  Normal  Mood:  Euthymic  Affect:  Appropriate  Thought Process:  Linear   Orientation:  Full (Time, Place, and Person)  Thought Content: WDL   Suicidal Thoughts:  No  Homicidal Thoughts:  No  Memory:  Immediate;   Fair Recent;   Fair Remote;   Poor  Judgement:  Poor  Insight:  Lacking  Psychomotor Activity:  Normal  Concentration:  Concentration: Fair and Attention Span: Fair  Recall:  [employer] of Knowledge: Fair  Language: Fair  Akathisia:  No  Handed:    AIMS (if indicated): not done  Assets:  Engineer, maintenance Social Support  ADL's:  Intact  Cognition: Impaired,  Moderate  Sleep:  Patient is unsure of the amount of hours she sleeps    Screenings: AIMS    Flowsheet Row Clinical Support from 08/19/2023 in [facility] Clinical Support from 06/24/2023 in [facility]) [facility]) [facility] ([facility]) [facility]2019  [facility]  AIMS Total Score 0 0 0 0 0   AUDIT    Flowsheet Row [facility]) [facility] (Discharged) from 07[facility]13/2019 [facility] (Dis[facility]  Alcohol Use Disorder Identification Test Final Score (AUDIT) 0 0 0   PHQ2-9    Flowsheet Row Office Visit from 06/09/2022 in [facility] [place] Office Visit from 06/21/2021 in [facility] [place] Office Visit from 10/20/2019 in [facility] [place] [facility] ED from 09/18/2019 in [facility] Office Visit from 07/11/2019 in [facility] Of [place]. [facility]  PHQ-2  Total Score 0 0 3 4 0  PHQ-9 Total Score 0 -- 11 13 --   Flowsheet Row Clinical Support from 10/16/2023 in [facility] Clinical Support from 08/19/2023 in [facility] Clinical Support from 06/24/2023 in [facility]  C-SSRS RISK CATEGORY Moderate Risk High Risk Moderate Risk     Assessment and Plan:   Patricia [name] is a 33 year old, African-American female with a past psychiatric history significant for adjustment disorder and bipolar disorder who presents to [facility], accompanied by her social worker Bernerd) and mother. Patient presents today for follow-up regarding her medication.  Interpretive services were utilized during the duration of the encounter.  Patient's mother presents to the encounter stating that the patient takes her medications most of the time and denies the patient experiencing any adverse side effects from her use of risperidone .  An aims assessment was performed with the patient scoring of 0.  Patient does not appear to be exhibiting any involuntary movements, stiffness, or rigidity.  Patient appears to be doing relatively well per mother.  Patient's mother states that the patient has not exhibited any aggressive behavior nor has she started any fights within the home.  She reports that the patient will occasionally not people in the household, but states that that is the extent of the patient's issues today.  Patient denies overt depressive symptoms nor does she endorse anxiety.  Patient denies paranoia and does not appear to be responding to internal/external stimuli. Patient is unable to perform a GAD-7 or PHQ-9 screen.   Patient appears to be tolerating her use of risperidone  4 mg in the morning and 4 mg at bedtime.  Provider to consider titrating down on the dosage to see if efficacy can be maintained at a lower dose.  Patient's medication to be  e-prescribed to pharmacy of choice.  A [place] Suicide Severity Rating Scale was performed with the patient being considered moderate risk.  Patient denies suicidal ideations and is able to contract for safety at this time.  Safety planning was discussed with the patient prior to the conclusion of the encounter.  - Patient was instructed to contact 911 in the event of a mental health crisis. - Patient was instructed to contact [facility] [employer] in the event of a mental health crisis. - Patient was instructed to present to [facility] in the event of a mental health crisis.  Collaboration of Care: Collaboration  of Care: Medication Management AEB provider managing patient's psychiatric medications and Psychiatrist AEB patient being followed by a mental health provider  Patient/Guardian was advised Release of Information must be obtained prior to any record release in order to collaborate their care with an outside provider. Patient/Guardian was advised if they have not already done so to contact the registration department to sign all necessary forms in order for us  to release information regarding their care.   Consent: Patient/Guardian gives verbal consent for treatment and assignment of benefits for services provided during this visit. Patient/Guardian expressed understanding and agreed to proceed.   1. Bipolar affective disorder, current episode mixed, current episode severity unspecified (HCC) (Primary)  - risperidone  (RISPERDAL ) 4 MG tablet; Take 1 tablet (4 mg total) by mouth 2 (two) times daily. Take 1 tablet in the morning and take 1 tablet in the evening each day  Dispense: 60 tablet; Refill: 1  2. Long term current use of antipsychotic medication Referral to cardiology for EKG  3. Adjustment disorder with mixed disturbance of emotions and conduct  Patient to follow-up in 2 months Provider spent a total of 30 minutes with the  patient/reviewing patient's chart  Reginia FORBES Bolster, PA 10/16/2023, 10:00 AM

## 2023-10-27 ENCOUNTER — Telehealth: Payer: Self-pay

## 2023-10-27 NOTE — Telephone Encounter (Signed)
 Franky (legal guardian) called and wanted to cancel appt but can only do Monday, Wednesday, or Friday. Routing to pod 1 as he was supposed to see amy to see if they can work him in any time soon.

## 2023-10-27 NOTE — Telephone Encounter (Signed)
 Patricia [name] at [phone]. Rescheduled appt for 11/02/23 at 930am with AL,NP. Asked she check in at 900am, bring updated insurance cards and med list to appt.

## 2023-10-28 ENCOUNTER — Ambulatory Visit: Payer: MEDICAID | Admitting: Family Medicine

## 2023-10-28 NOTE — Progress Notes (Signed)
 Chief Complaint  Patient presents with   RM1/SEIZURES    Pt is here with her Mother and Caseworker. Pt's mother states that pt has had seizure activity on yesterday.     HISTORY OF PRESENT ILLNESS:  11/02/23 ALL: Patricia [name] returns for follow up. She was last seen by me 06/2022 and doing well on lamotrigine  150mg  BID and levetiracetam  1500mg  BID. Since, mom reports she was doing well until yesterday. She had a bad breakthrough seizure. Mom describes the event as an abrupt cease of communication and then Braley fell to ground. Mom describes stiffening of her upper and lower extremities for a few minutes then she was very confused and disoriented for about 30 minutes afterwards. They did not seek medical attention. Mom thinks she has taken ASM consistently. Usually takes morning dose around 6am and second dose at 6pm. Siblings helps administer some doses when mom has to work. Mom reports behaviors are usually well controlled She continues to see psychiatry.   Franky ([employer] social worker) and interpreter assisted with visit.   07/17/2022 ALL:  Patricia [name] returns for follow up for seizures. She was last seen by Dr Gregg 12/2021. No evidence of seizure activity but she reported unexplained falls when sitting. EEG was ordered and was normal. Lamictal  was increased to 150mg  BID and Keppra  1500mg  BID continued. Since, she reports doing well. She tells our CMA that she had a seizure yesterday. No family here to confirm. Franky, Child psychotherapist, does not know of any concerns of seizure. She was not taken to ER. She seems to be tolerating meds well.   She has lost about 14lbs since visit with Dr Gregg. Unclear if she has is trying to lose weight. She reports no difficulty getting food. She tells me her appetite is good . She does have intermittent stomach pains but unable to tell me what it feels like. She denies vomiting. She is followed regularly by PCP and psychiatry.   Interpreter on the phone  12/27/21  [facility]: Patient presents today for follow-up, she is accompanied by her guardian Franky and Contractor.  She is on Lamictal  150 mg daily, Keppra  1500 mg twice daily for his seizures.  Patient has been reporting lately that she has been falling, with no cause.  These falls happened when she is sitting down.  She cannot think of anything that brings them on.  I spoke with her mother over the phone and she has reported the fall while sitting down.  She reports patient had one fall.  She is compliant with her medications.  Denies any generalized tonic-clonic activity.  11/05/2020 ALL: Patricia [name] returns for follow up for seizures. She was last seen by Dr Jenel 04/2020 and continued on levetiracetam  1500mg  BID and lamotrigine  was added (had not received rx started with me in 02/2020). She was advised to titrate lamotrigine  to 75mg  BID then call for new dose of 100mg  BID. She was seen in the ER 07/13/2020 for behavioral concerns and reportedly not compliant with Risperdal , levetiracetam  and lamotrigine . Meds were restarted. She has continued levetiracetam  1500mg  BID and lamotrigine  50mg  daily (dose prescribed by psychiatry).   She presents with her mother and interpreter who aid in history. Since seeing Dr Jenel, she has had three seizures. All have been in the setting of missed AED doses for several days. Her mother is now overseeing administration of medications and she denies recently missed doses. Medications are pill packed and mailed to family via [employer] Upon review of  individual bubble pack, it is noted that she has three lamotrigine  tablets for Thursday and only one on Friday.   She continues close follow up with Reginia Bolster, PA with psychiatry. Last seen 10/25/2020 and next appt 12/28/2020. She is also followed by Dr Delores, PCP.  05/22/2020 KW: Patricia [name] is a 33 year old left-handed black female with a history of seizures.  The patient has had poorly controlled seizures, in the month of  February she had at least 4 seizures.  She has been on Keppra  with an increase in the dose to 1500 mg twice daily.  Lamotrigine  was recommended, but the patient apparently never got the prescription.  She will have episodes of behavior where she will try to run away from her home, she was seen in the emergency room on 14 May 2020 when she tried to run away, the police were able to find her and they got her to the emergency room.  Blood work was done at that time.  The patient has not apparently had any increase in anxiety or agitation with the increase in the Keppra  level according to the family.  This behavior has been present for years.  Her typical seizures are associated with dystonic posturing and spinning and then a generalized seizure.  The patient comes to the office today for further evaluation.  03/07/2020 ALL:  She returns today for seizure follow up. We increased levetiracetam  to 1500mg  BID in 02/09/2020 (pateint most likely started increased dose 12/19 with congregational nurse visit) due to continued reports of seizures. Per last note from congregational nurse, she last had a seizure on 12/28.  Her sister-in-law presents with her today and states that patient's last seizure was actually yesterday, 03/06/2020.  She has not notified the congregational nurse of this event.  She reports that Margean suddenly started clearing her throat/coughing then started to pull her hair with right hand. Head, neck and eyes were deviated to left. Anjeli was unable to communicate and then fell to the ground. Event lasted a few minutes and she was back to baseline within 5 minutes. She did have urinary incontinence but no tongue injury, no tonic clonic movements. Event was very similar to previous reports of seizure activity. Sister in law reports being present with medication administration and with the exception of 1 dose, she has taken (3) 500mg  tablets twice daily since 12/19. She has had 2-3 seizures since increased  dose.   She has had significant behavioral concerns. On 1/2, she ran out in the road at night without clothes. Per sister in law repots, she seems to have erratic/uncontrollable behavior periodically but consistently worse on the weekends when Dorthula's mother is home. She is not followed by psychiatry. She is followed closely by Dr Delores, PCP.   01/17/2020 ALL: Patricia [name] is a 33 y.o. female here today for follow up for questionable breakthrough seizures. She presents with her brother and sister in law who aid in history with the assistance of interpreter. She was last seen in 09/2019 when she was reportedly doing well. No seizures at that time. Her brother reports that over the past two weeks she has had 3 seizures. With event, she will have urinary incontinence and then stare off into space. No tonic clonic movements but is not responsive. She has collapsed once. She has had some behavioral concerns as well with leaving the home without a supervisor. She has a caregiver 24 hours a day. She is working with mental health provider per her brother.  She eats and drinks well but prefers mostly fruits and drinks soda and juice. Very little water .   10/10/2019 ALL: Patricia [name] is a 33 y.o. female here today for follow up. She presents today with her brother and sister in law who aid in history. She continues levetiracetam  500mg  twice daily. Family reports that she is doing well on this medication. They denies recent seizure activity. She is followed by Dr Suzann Daring, PCP. Family reports that they have not yet received refill sent by Dr Daring on 8/12. Her brother will go by pharmacy today to inquire. Revella reports feeling well. No concerns. No pain. She is cognitively impaired but able to communicate with me today via interpretor.    Jocelyne, interpreter, assists with visit. Patient speaks Swahili.   REVIEW OF SYSTEMS: Out of a complete 14 system review of symptoms, the patient complains only of the  following symptoms, seizures, behavioral concerns and all other reviewed systems are negative.   ALLERGIES: No Known Allergies   HOME MEDICATIONS: Outpatient Medications Prior to Visit  Medication Sig Dispense Refill   hydrocortisone  2.5 % ointment Apply topically daily as needed. 30 g 0   lamoTRIgine  (LAMICTAL ) 150 MG tablet TAKE ONE TABLET BY MOUTH TWICE A DAY 60 tablet 10   levETIRAcetam  (KEPPRA ) 500 MG tablet TAKE THREE TABLETS BY MOUTH TWICE A DAY 180 tablet 2   risperidone  (RISPERDAL ) 4 MG tablet Take 1 tablet (4 mg total) by mouth 2 (two) times daily. Take 1 tablet in the morning and take 1 tablet in the evening each day 60 tablet 1   No facility-administered medications prior to visit.     PAST MEDICAL HISTORY: Past Medical History:  Diagnosis Date   Bipolar 1 disorder (HCC)    Cerebral palsy (HCC)    Seizures (HCC)    Suicidal behavior      PAST SURGICAL HISTORY: History reviewed. No pertinent surgical history.   FAMILY HISTORY: Family History  Problem Relation Age of Onset   Hepatitis Brother      SOCIAL HISTORY: Social History   Socioeconomic History   Marital status: Single    Spouse name: Not on file   Number of children: 1   Years of education: Not on file   Highest education level: Not on file  Occupational History    Comment: none  Tobacco Use   Smoking status: Never   Smokeless tobacco: Never  Vaping Use   Vaping status: Never Used  Substance and Sexual Activity   Alcohol use: Not Currently    Alcohol/week: 2.0 standard drinks of alcohol    Types: 2 Cans of beer per week    Comment: occasional alcohol use   Drug use: Never   Sexual activity: Not Currently  Other Topics Concern   Not on file  Social History Narrative   Lives with mother, family   Social Drivers of Corporate investment banker Strain: Not on file  Food Insecurity: Not on file  Transportation Needs: Not on file  Physical Activity: Not on file  Stress: Not on file   Social Connections: Not on file  Intimate Partner Violence: Not on file      PHYSICAL EXAM  Vitals:   11/02/23 0928  Weight: 173 lb 8 oz (78.7 kg)  Height: 5' 2 (1.575 m)      Body mass index is 31.73 kg/m.   Generalized: Well developed, in no acute distress  Cardiology: normal rate and rhythm, no murmur auscultated  Respiratory:  clear to auscultation bilaterally    Neurological examination  Mentation: Alert, not oriented to time, place, or history taking. Follows most commands speech and language fluent, developmentally delayed, laughs though out visit, repetitive in yes/no answers Cranial nerve II-XII: Pupils were equal round reactive to light. Extraocular movements were full, visual field were full on confrontational test. Facial sensation and strength were normal. Head turning and shoulder shrug  were normal and symmetric. Motor: The motor testing reveals 5 over 5 strength of all 4 extremities. Good symmetric motor tone is noted throughout.  Sensory: Sensory testing is intact to soft touch on all 4 extremities. No evidence of extinction is noted.  Coordination: she is unable to follow instructions Gait and station: Gait is wide  DIAGNOSTIC DATA (LABS, IMAGING, TESTING) - I reviewed patient records, labs, notes, testing and imaging myself where available.  Lab Results  Component Value Date   WBC 5.8 06/08/2023   HGB 12.3 06/08/2023   HCT 39.1 06/08/2023   MCV 84 06/08/2023   PLT 218 06/08/2023      Component Value Date/Time   NA 141 06/08/2023 1214   K 4.2 06/08/2023 1214   CL 107 (H) 06/08/2023 1214   CO2 18 (L) 06/08/2023 1214   GLUCOSE 83 06/08/2023 1214   GLUCOSE 130 (H) 08/23/2022 0018   BUN 11 06/08/2023 1214   CREATININE 0.87 06/08/2023 1214   CALCIUM 9.2 06/08/2023 1214   PROT 7.3 06/08/2023 1214   ALBUMIN 4.5 06/08/2023 1214   AST 16 06/08/2023 1214   ALT 19 06/08/2023 1214   ALKPHOS 83 06/08/2023 1214   BILITOT 0.2 06/08/2023 1214    GFRNONAA >60 08/23/2022 0000   GFRAA 102 03/07/2020 0957   Lab Results  Component Value Date   CHOL 140 06/08/2023   HDL 57 06/08/2023   LDLCALC 73 06/08/2023   TRIG 44 06/08/2023   CHOLHDL 2.5 06/08/2023   Lab Results  Component Value Date   HGBA1C 5.3 06/08/2023   No results found for: [id number] Lab Results  Component Value Date   TSH 1.312 06/30/2019      ASSESSMENT AND PLAN  33 y.o. year old female  has a past medical history of Bipolar 1 disorder (HCC), Cerebral palsy (HCC), Seizures (HCC), and Suicidal behavior. here with   Seizure disorder (HCC) - Plan: Lamotrigine  level, Levetiracetam  level, CBC with Differential/Platelets, CMP, EEG adult  Cognitive developmental delay  Language barrier  Adjustment disorder with mixed disturbance of emotions and conduct   Amiley was doing well on lamotrigine  150mg  and levetiracetam  1500mg  BID. Mom reports breakthrough seizure occurring yesterday. Unclear if in setting of missed ASM. We will continue current treatment plan for now. I will update labs to assess ASM levels. May adjust doses pending results. I have ordered an EEG for eval. She will continue close follow up with her PCP. I will have her follow up with Dr Gregg in 6 months, sooner if needed. Franky and mom verbalize understanding and agreement with this plan.   I personally spent a total of 40 minutes in the care of the patient today including preparing to see the patient, getting/reviewing separately obtained history, performing a medically appropriate exam/evaluation, counseling and educating, placing orders, documenting clinical information in the EHR, independently interpreting results, communicating results, and coordinating care.   Greig Forbes, MSN, FNP-C 11/02/2023, 9:56 AM  [facility] [address], [address] [place], [place] [address] [phone]

## 2023-10-28 NOTE — Patient Instructions (Signed)
 Below is our plan:  We will continue lamotrigine  150mg  and levetiracetam  1500mg  twice daily. I will update labs, today. We will discuss case with Dr Gregg and repeat EEG if needed. Our office will call to schedule this test.   Please make sure you are consistent with timing of seizure medication. I recommend annual visit with primary care provider (PCP) for complete physical and routine blood work. I recommend daily intake of vitamin D (400-800iu) and calcium (800-1000mg ) for bone health. Discuss Dexa screening with PCP.   Please maintain precautions. Do not participate in activities where a loss of awareness could harm you or someone else. No swimming alone, no tub bathing, no hot tubs, no driving, no operating motorized vehicles (cars, ATVs, motocycles, etc), lawnmowers, power tools or firearms. No standing at heights, such as rooftops, ladders or stairs. Avoid hot objects such as stoves, heaters, open fires. Wear a helmet when riding a bicycle, scooter, skateboard, etc. and avoid areas of traffic. Set your water  heater to 120 degrees or less.  SUDEP is the sudden, unexpected death of someone with epilepsy, who was otherwise healthy. In SUDEP cases, no other cause of death is found when an autopsy is done. Each year, more than 1 in 1,000 people with epilepsy die from SUDEP. This is the leading cause of death in people with uncontrolled seizures. Until further answers are available, the best way to prevent SUDEP is to lower your risk by controlling seizures. Research has found that people with all types of epilepsy that experience convulsive seizures can be at risk.  Please make sure you are staying well hydrated. I recommend 50-60 ounces daily. Well balanced diet and regular exercise encouraged. Consistent sleep schedule with 6-8 hours recommended.   Please continue follow up with care team as directed.   Follow up with Dr Gregg in 4-6 months  You may receive a survey regarding today's visit. I  encourage you to leave honest feed back as I do use this information to improve patient care. Thank you for seeing me today!

## 2023-11-02 ENCOUNTER — Encounter: Payer: Self-pay | Admitting: Family Medicine

## 2023-11-02 ENCOUNTER — Telehealth: Payer: Self-pay | Admitting: Family Medicine

## 2023-11-02 ENCOUNTER — Ambulatory Visit (INDEPENDENT_AMBULATORY_CARE_PROVIDER_SITE_OTHER): Payer: MEDICAID | Admitting: Family Medicine

## 2023-11-02 VITALS — Ht 62.0 in | Wt 173.5 lb

## 2023-11-02 DIAGNOSIS — F819 Developmental disorder of scholastic skills, unspecified: Secondary | ICD-10-CM | POA: Diagnosis not present

## 2023-11-02 DIAGNOSIS — G40909 Epilepsy, unspecified, not intractable, without status epilepticus: Secondary | ICD-10-CM

## 2023-11-02 DIAGNOSIS — Z603 Acculturation difficulty: Secondary | ICD-10-CM

## 2023-11-02 DIAGNOSIS — F4325 Adjustment disorder with mixed disturbance of emotions and conduct: Secondary | ICD-10-CM

## 2023-11-02 DIAGNOSIS — Z758 Other problems related to medical facilities and other health care: Secondary | ICD-10-CM

## 2023-11-02 NOTE — Telephone Encounter (Signed)
 Hi Risk manager,  Please schedule this patient for follow up with me in my clinic or immigrant  in the next 1-2 months.   Please call case worker Franky to schedule   Thanks, Suzann Daring, MD  [facility] Medicine Teaching Service

## 2023-11-04 ENCOUNTER — Ambulatory Visit: Payer: Self-pay | Admitting: Family Medicine

## 2023-11-04 LAB — COMPREHENSIVE METABOLIC PANEL WITH GFR
ALT: 18 IU/L (ref 0–32)
AST: 18 IU/L (ref 0–40)
Albumin: 4.7 g/dL (ref 3.9–4.9)
Alkaline Phosphatase: 67 IU/L (ref 44–121)
BUN/Creatinine Ratio: 10 (ref 9–23)
BUN: 9 mg/dL (ref 6–20)
Bilirubin Total: 0.6 mg/dL (ref 0.0–1.2)
CO2: 16 mmol/L — ABNORMAL LOW (ref 20–29)
Calcium: 9.5 mg/dL (ref 8.7–10.2)
Chloride: 106 mmol/L (ref 96–106)
Creatinine, Ser: 0.88 mg/dL (ref 0.57–1.00)
Globulin, Total: 3.1 g/dL (ref 1.5–4.5)
Glucose: 82 mg/dL (ref 70–99)
Potassium: 4.1 mmol/L (ref 3.5–5.2)
Sodium: 138 mmol/L (ref 134–144)
Total Protein: 7.8 g/dL (ref 6.0–8.5)
eGFR: 89 mL/min/1.73 (ref >59)

## 2023-11-04 LAB — CBC WITH DIFFERENTIAL/PLATELET
Basophils Absolute: 0 x10E3/uL (ref 0.0–0.2)
Basos: 1 %
EOS (ABSOLUTE): 0.1 x10E3/uL (ref 0.0–0.4)
Eos: 1 %
Hematocrit: 42.9 % (ref 34.0–46.6)
Hemoglobin: 13.5 g/dL (ref 11.1–15.9)
Immature Grans (Abs): 0 x10E3/uL (ref 0.0–0.1)
Immature Granulocytes: 0 %
Lymphocytes Absolute: 2.3 x10E3/uL (ref 0.7–3.1)
Lymphs: 37 %
MCH: 26.5 pg — ABNORMAL LOW (ref 26.6–33.0)
MCHC: 31.5 g/dL (ref 31.5–35.7)
MCV: 84 fL (ref 79–97)
Monocytes Absolute: 0.6 x10E3/uL (ref 0.1–0.9)
Monocytes: 9 %
Neutrophils Absolute: 3.3 x10E3/uL (ref 1.4–7.0)
Neutrophils: 52 %
Platelets: 263 x10E3/uL (ref 150–450)
RBC: 5.09 x10E6/uL (ref 3.77–5.28)
RDW: 13.1 % (ref 11.7–15.4)
WBC: 6.3 x10E3/uL (ref 3.4–10.8)

## 2023-11-04 LAB — LAMOTRIGINE LEVEL: Lamotrigine Lvl: <1 ug/mL — ABNORMAL LOW (ref 2.0–20.0)

## 2023-11-04 LAB — LEVETIRACETAM LEVEL: Levetiracetam Lvl: <2 ug/mL — AB (ref 10.0–40.0)

## 2023-11-05 NOTE — Telephone Encounter (Signed)
 Mother not on DPR. Unable to contact mother.   Kevin-guardian is listed on DPR. I called him at [phone]. Relayed message from Amy. He states mother is the one to handle medication management for her. He is going to have a discussion with mother about making sure she is taking seizure medications as prescribed for seizure management. He will call back if they want to try XR option if they feel this would be a better option. Aware it may be more costly. Would not know cost until it was sent to pharmacy.

## 2023-11-06 ENCOUNTER — Other Ambulatory Visit: Payer: Self-pay

## 2023-11-06 ENCOUNTER — Other Ambulatory Visit: Payer: Self-pay | Admitting: Neurology

## 2023-11-06 MED ORDER — LEVETIRACETAM 500 MG PO TABS: ORAL_TABLET | ORAL | 2 refills | Status: AC

## 2023-11-30 ENCOUNTER — Ambulatory Visit (INDEPENDENT_AMBULATORY_CARE_PROVIDER_SITE_OTHER): Payer: MEDICAID | Admitting: Neurology

## 2023-11-30 DIAGNOSIS — G40909 Epilepsy, unspecified, not intractable, without status epilepticus: Secondary | ICD-10-CM

## 2023-11-30 NOTE — Procedures (Signed)
    History:  33 year old woman with seizure   EEG classification: Awake and drowsy  Duration: 25 minutes   Technical aspects: This EEG study was done with scalp electrodes positioned according to the 10-20 International system of electrode placement. Electrical activity was reviewed with band pass filter of 1-70Hz , sensitivity of 7 uV/mm, display speed of 42mm/sec with a 60Hz  notched filter applied as appropriate. EEG data were recorded continuously and digitally stored.   Description of the recording: The background rhythms of this recording consists of a fairly well modulated medium amplitude alpha rhythm of 11 Hz that is reactive to eye opening and closure. Present in the anterior head region is a 15-20 Hz beta activity. Photic stimulation was performed, did not show any abnormalities. Hyperventilation was also performed, did not show any abnormalities. Drowsiness was manifested by background fragmentation. No abnormal epileptiform discharges seen during this recording. There was intermittent right hemispheric slowing. There were no electrographic seizure identified.   Abnormality: Intermittent right hemispheric slowing   Impression: This is an abnormal awake and drowsy EEG due to right hemispheric slowing suggestive of neuronal dysfunction within the right hemisphere. No seizures or epileptiform discharges were seen during this recording.     Umaima Scholten, MD [facility]

## 2023-12-03 NOTE — Congregational Nurse Program (Signed)
  Dept: [phone]   Congregational Nurse Program Note  Date of Encounter: 12/03/2023  Past Medical History: Past Medical History:  Diagnosis Date   Bipolar 1 disorder (HCC)    Cerebral palsy (HCC)    Seizures (HCC)    Suicidal behavior     Encounter Details:  Community Questionnaire - 12/03/23 0921       Questionnaire   Ask client: Do you give verbal consent for me to treat you today? N/A    Student Assistance N/A    Location Patient Served  [facility]    Encounter Setting Phone/Text/Email    Population Status Migrant/Refugee    Insurance Medicaid    Insurance/Financial Assistance Referral N/A    Medication Have Medication Insecurities    Medical Provider Yes    Screening Referrals Made N/A    Medical Referrals Made N/A    Medical Appointment Completed [facility] P[facility]inic;Dental    CNP Interventions Advocate/Support;Navigate Healthcare System;Case Management;Counsel    Screenings CN Performed N/A    ED Visit Averted Yes    Life-Saving Intervention Made N/A         I was called by parent on 12/02/23 at 1030 am  that patient was unmanageable with behavioral issues running away from home onto the oncoming traffic. Chart reviewed and noted possibility of non compliance with medication as noted with labs last month. I have called legal guardian Franky and informed him of situation at home.Franky will make te necessary appointments for patient to be seen. Parent educated regarding the importance of medication compliance and verbalized understanding.  [name] Renesmee Raine RN BSN PCCN  [facility] [phone]-cell [phone]-office

## 2023-12-10 NOTE — Assessment & Plan Note (Deleted)
 Recently saw Neurology Lamictal  150 mg and Keppra  1500 mg BID

## 2023-12-10 NOTE — Progress Notes (Deleted)
    SUBJECTIVE:   CHIEF COMPLAINT: annual exam HPI:   Patricia [name] is a 33 y.o.  with history notable for seizure disorder, intellectual disability and adjustment disorder presenting for follow up and annual exam .   Discussed the use of AI scribe software for clinical note transcription with the patient, who gave verbal consent to proceed.  History of Present Illness      PERTINENT  PMH / PSH/Family/Social History : ***  OBJECTIVE:   There were no vitals taken for this visit.  Today's weight:  Review of prior weights: There were no vitals filed for this visit.  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

## 2023-12-11 ENCOUNTER — Ambulatory Visit: Payer: MEDICAID | Admitting: Family Medicine

## 2023-12-11 DIAGNOSIS — Z0000 Encounter for general adult medical examination without abnormal findings: Secondary | ICD-10-CM

## 2023-12-11 DIAGNOSIS — G40909 Epilepsy, unspecified, not intractable, without status epilepticus: Secondary | ICD-10-CM

## 2023-12-11 DIAGNOSIS — F02B18 Dementia in other diseases classified elsewhere, moderate, with other behavioral disturbance: Secondary | ICD-10-CM

## 2023-12-11 DIAGNOSIS — Z309 Encounter for contraceptive management, unspecified: Secondary | ICD-10-CM

## 2023-12-11 DIAGNOSIS — G808 Other cerebral palsy: Secondary | ICD-10-CM

## 2023-12-11 DIAGNOSIS — F4325 Adjustment disorder with mixed disturbance of emotions and conduct: Secondary | ICD-10-CM

## 2023-12-22 ENCOUNTER — Other Ambulatory Visit (HOSPITAL_COMMUNITY): Payer: Self-pay | Admitting: Physician Assistant

## 2023-12-22 DIAGNOSIS — F3160 Bipolar disorder, current episode mixed, unspecified: Secondary | ICD-10-CM

## 2023-12-23 ENCOUNTER — Encounter (HOSPITAL_COMMUNITY): Payer: Self-pay | Admitting: Physician Assistant

## 2023-12-23 ENCOUNTER — Ambulatory Visit (INDEPENDENT_AMBULATORY_CARE_PROVIDER_SITE_OTHER): Payer: MEDICAID | Admitting: Physician Assistant

## 2023-12-23 VITALS — BP 106/72 | HR 68 | Temp 98.2°F | Ht 62.0 in | Wt 181.4 lb

## 2023-12-23 DIAGNOSIS — F02B18 Dementia in other diseases classified elsewhere, moderate, with other behavioral disturbance: Secondary | ICD-10-CM

## 2023-12-23 DIAGNOSIS — Z79899 Other long term (current) drug therapy: Secondary | ICD-10-CM | POA: Diagnosis not present

## 2023-12-23 DIAGNOSIS — F3160 Bipolar disorder, current episode mixed, unspecified: Secondary | ICD-10-CM

## 2023-12-23 DIAGNOSIS — F4325 Adjustment disorder with mixed disturbance of emotions and conduct: Secondary | ICD-10-CM

## 2023-12-23 MED ORDER — RISPERIDONE 4 MG PO TABS: 4.0000 mg | ORAL_TABLET | Freq: Two times a day (BID) | ORAL | 1 refills | Status: DC
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
## 2023-12-28 ENCOUNTER — Other Ambulatory Visit (HOSPITAL_COMMUNITY): Payer: MEDICAID

## 2023-12-30 ENCOUNTER — Encounter (HOSPITAL_COMMUNITY): Payer: Self-pay

## 2023-12-30 ENCOUNTER — Ambulatory Visit (INDEPENDENT_AMBULATORY_CARE_PROVIDER_SITE_OTHER): Payer: MEDICAID

## 2023-12-30 DIAGNOSIS — Z79899 Other long term (current) drug therapy: Secondary | ICD-10-CM | POA: Diagnosis not present

## 2023-12-30 NOTE — Progress Notes (Unsigned)
 Patient presented the office for EKG testing , test was preformed with no issue pt was informed that provider will reach out with those results

## 2024-01-01 ENCOUNTER — Encounter: Payer: Self-pay | Admitting: Family Medicine

## 2024-01-01 ENCOUNTER — Ambulatory Visit: Payer: MEDICAID | Admitting: Family Medicine

## 2024-01-01 VITALS — BP 98/71 | HR 79 | Wt 182.4 lb

## 2024-01-01 DIAGNOSIS — N920 Excessive and frequent menstruation with regular cycle: Secondary | ICD-10-CM

## 2024-01-01 DIAGNOSIS — Z23 Encounter for immunization: Secondary | ICD-10-CM

## 2024-01-01 DIAGNOSIS — Z0000 Encounter for general adult medical examination without abnormal findings: Secondary | ICD-10-CM

## 2024-01-01 DIAGNOSIS — G40909 Epilepsy, unspecified, not intractable, without status epilepticus: Secondary | ICD-10-CM

## 2024-01-01 DIAGNOSIS — Z72820 Sleep deprivation: Secondary | ICD-10-CM

## 2024-01-01 NOTE — Assessment & Plan Note (Signed)
 At last Neurology visit levels undetectable  Reports no seizures Repeat Keppra  and Lamictal  levels today

## 2024-01-01 NOTE — Progress Notes (Signed)
    SUBJECTIVE:   CHIEF COMPLAINT: annual exam  HPI:   Patricia [name] [id number] The patient speaks Swahili as their primary language.  An interpreter was used for the entire visit.    Patricia [name] is a 33 y.o.  with history notable for seizure disorder, intellectual disability  presenting for annual exam .   She has no specific concerns today. She is alone, brought by her guardian. He has no concerns. She denies seizures. She is uncertain of her LMP but reports monthly menses which are somewhat heavy. No headaches, chest pain, dizziness, syncope.   PERTINENT  PMH / PSH/Family/Social History : seizures, intellectual disability   OBJECTIVE:   BP 98/71   Pulse 79   Wt 182 lb 6.4 oz (82.7 kg)   SpO2 99%   BMI 33.36 kg/m   Today's weight:  Last Weight  Most recent update: 01/01/2024  9:55 AM    Weight  82.7 kg (182 lb 6.4 oz)            Review of prior weights: Filed Weights   01/01/24 0955  Weight: 182 lb 6.4 oz (82.7 kg)   HEENT: EOMI. Sclera without injection or icterus. MMM. External auditory canal examined and WNL. TM normal appearance, no erythema or bulging. Neck: Supple.  Cardiac: Regular rate and rhythm. Normal S1/S2. No murmurs, rubs, or gallops appreciated. Lungs: Clear bilaterally to ascultation.  Abdomen: Normoactive bowel sounds. No tenderness to deep or light palpation. No rebound or guarding.  Psych: Pleasant and appropriate   ASSESSMENT/PLAN:   Assessment & Plan Seizure disorder (HCC) At last Neurology visit levels undetectable  Reports no seizures Repeat Keppra  and Lamictal  levels today  Annual physical exam Up to date on age appropriate blood work Will schedule back with mom for Pap, discussed For heavy  menses, CBC and ferritin today  Menorrhagia with regular cycle CBC and ferritin today  Has a nexplanon   Encounter for immunization Flu shot today  Poor sleep Discussed sleep hygiene (she is napping a lot of the day)     Suzann Daring, MD   Family Medicine Teaching Service  [facility]

## 2024-01-01 NOTE — Patient Instructions (Addendum)
 It was wonderful to see you today.  Please bring ALL of your medications with you to every visit.   Today we talked about:  - Do not nap during the day   - I will discuss your medications with [name]   - We will check blood work today   Please follow up in 3 months   Thank you for choosing [facility].   Please call [phone] with any questions about today's appointment.  Please be sure to schedule follow up at the front  desk before you leave today.   Suzann Daring, MD  Family Medicine    Patricia [name].  Tafadhali leta dawa zako ZOTE kwa kila ziara.   Vila kyle juu ya:  - [place]ilale wakati wa mchana   - Nitajadili dawa zako na [name]   - [place] kazi ya damu [name]   Tafadhali fuatilia baada ya miezi 3   [place]ante kwa k[place] Dawa ya Familia ya [place].   [place] piga simu [phone] na maswali yoyote kuhusu miadi ya [name].  Tafadhali yjxpxpdyj umepanga ufuatiliaji kwenye dawati la mbele kabla ya [place] [name].   Suzann Daring, MD  Patricia [name]

## 2024-01-02 ENCOUNTER — Ambulatory Visit: Payer: Self-pay | Admitting: Family Medicine

## 2024-01-03 LAB — CBC
Hematocrit: 39.9 % (ref 34.0–46.6)
Hemoglobin: 12.4 g/dL (ref 11.1–15.9)
MCH: 25.8 pg — ABNORMAL LOW (ref 26.6–33.0)
MCHC: 31.1 g/dL — ABNORMAL LOW (ref 31.5–35.7)
MCV: 83 fL (ref 79–97)
Platelets: 236 x10E3/uL (ref 150–450)
RBC: 4.81 x10E6/uL (ref 3.77–5.28)
RDW: 13.3 % (ref 11.7–15.4)
WBC: 4.4 x10E3/uL (ref 3.4–10.8)

## 2024-01-03 LAB — FERRITIN: Ferritin: 65 ng/mL (ref 15–150)

## 2024-01-03 LAB — LAMOTRIGINE LEVEL: Lamotrigine Lvl: 8.1 ug/mL (ref 2.0–20.0)

## 2024-01-03 LAB — LEVETIRACETAM LEVEL: Levetiracetam Lvl: 44.5 ug/mL — AB (ref 10.0–40.0)

## 2024-02-08 NOTE — Congregational Nurse Program (Signed)
°  Dept: [phone]   Congregational Nurse Program Note  Date of Encounter: 02/08/2024  Past Medical History: Past Medical History:  Diagnosis Date   Bipolar 1 disorder (HCC)    Cerebral palsy (HCC)    Seizures (HCC)    Suicidal behavior     Encounter Details:  Community Questionnaire - 02/08/24 1100       Questionnaire   Ask client: Do you give verbal consent for me to treat you today? N/A   Patient has a English As A Second Language Teacher N/A    Location Patient Served  [facility]    Encounter Setting Other   Dentist   Population Status Migrant/Refugee    Insurance Medicaid    Insurance/Financial Assistance Referral N/A    Medication Have Medication Insecurities    Medical Provider Yes    Screening Referrals Made N/A    Medical Referrals Made N/A    Medical Appointment Completed [facility];Dental;[facility]    CNP Interventions Advocate/Support;Navigate Healthcare System;Case Management;Counsel    Screenings CN Performed N/A    ED Visit Averted N/A    Life-Saving Intervention Made N/A        I accompanied Patricia [name] and her legal guardian to [facility] to assist with interpretation. Dentist has no interpreters. X-rays done, teeth cleaned,4 cavities were identified and appointment scheduled in January to fix the cavities. Patient encouraged to brush teeth at least twice daily.  [name] RN BSN PCCN  [facility] [phone]-cell [phone]-office

## 2024-02-10 ENCOUNTER — Encounter (HOSPITAL_COMMUNITY): Payer: Self-pay | Admitting: Physician Assistant

## 2024-02-10 ENCOUNTER — Ambulatory Visit (INDEPENDENT_AMBULATORY_CARE_PROVIDER_SITE_OTHER): Payer: MEDICAID | Admitting: Physician Assistant

## 2024-02-10 VITALS — BP 100/70 | HR 86 | Temp 97.6°F | Ht 62.0 in | Wt 185.8 lb

## 2024-02-10 DIAGNOSIS — F02B18 Dementia in other diseases classified elsewhere, moderate, with other behavioral disturbance: Secondary | ICD-10-CM

## 2024-02-10 DIAGNOSIS — F3160 Bipolar disorder, current episode mixed, unspecified: Secondary | ICD-10-CM | POA: Diagnosis not present

## 2024-02-10 DIAGNOSIS — F4325 Adjustment disorder with mixed disturbance of emotions and conduct: Secondary | ICD-10-CM | POA: Diagnosis not present

## 2024-02-10 MED ORDER — RISPERIDONE 4 MG PO TABS: 4.0000 mg | ORAL_TABLET | Freq: Two times a day (BID) | ORAL | 2 refills | Status: AC

## 2024-02-10 NOTE — Progress Notes (Signed)
 BH MD/PA/NP OP Progress Note  02/10/2024 10:00 AM Patricia [name]  MRN:  [id number]  Chief Complaint:  Chief Complaint  Patient presents with   Follow-up   Medication Refill   HPI:   Patricia [name] is a 33 year old, African-American female with a past psychiatric history significant for adjustment disorder and bipolar disorder who presents to [facility], accompanied by her social worker Bernerd). Patient presents today for follow-up regarding her medication.  Interpretive services were utilized during the duration of the encounter.  Patient is currently being managed on the following medications: Risperdal  4 mg in the morning/4 mg at bedtime.  Patient presents to the encounter stating that she is doing good.  Patient reports that she has been taking her medications regularly.  She reports experiencing side effects but when asked to elaborate, patient reports that she does not know if there has been any side effects/she cannot tell.  Patient denies overt depression nor does she endorse anxiety.  When asked about auditory hallucinations, patient reports that she hears voices in her head but cannot tell whose voices they are.  In regards to living in the household, patient reports that things have been going well.  Patient states that she has been behaving well and denies having any issues or concerns. Patient is unable to perform a GAD-7 or PHQ-9 screen.   Provider obtained collateral from the patient's sister Neal Benton, [phone]) regarding how the patient has been doing since the last encounter.  Per patient's sister, the patient has been fine and has been taking her medications regularly.  She reported that the patient was not experiencing any side effects associated with her use of risperidone .  She denies the patient exhibiting any bizarre behaviors but stated that the patient has a habit of going in and out of the home.  The only concern  that the patient's sister expressed was that the patient appears to be losing weight.  Provider instructed patient's sister to make sure that patient is following up with her family medicine provider.  Patient's sister vocalized understanding.  Patient is calm, cooperative, and engaged in conversation during the encounter.  Patient endorses okay mood.  Patient reports that she is feeling good patient exhibits stable mood with appropriate affect.  Patient denies suicidal or homicidal ideations.  She further denies auditory or visual hallucinations and does not appear to be responding to internal/external stimuli.  Patient is unsure of the amount of sleep that she is regularly receiving.  Per patient's sister, patient's sleep schedule is dependent on how much she has slept the night before.  Per patient's sister, patient endorses good appetite and eats on average 2 meals per day.  Patient denies alcohol consumption, tobacco use, or illicit drug use.  Visit Diagnosis:    ICD-10-CM   1. Bipolar affective disorder, current episode mixed, current episode severity unspecified (HCC)  F31.60 risperidone  (RISPERDAL ) 4 MG tablet    2. Adjustment disorder with mixed disturbance of emotions and conduct  F43.25     3. 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 No pertinent surgical history.  Family Psychiatric History:  Per patient's sister, they have a younger sister with a disability but it is not psychiatric in nature    Family History:  Family History  Problem Relation Age of Onset    Hepatitis Brother     Social History:  Social History   Socioeconomic History   Marital status: Single    Spouse name: Not on file   Number of children: 1   Years of education: Not on file   Highest education level: Not on file  Occupational History    Comment: none  Tobacco Use   Smoking status: Never   Smokeless tobacco: Never  Vaping Use   Vaping status: Never Used  Substance and Sexual Activity   Alcohol use: Not Currently    Alcohol/week: 2.0 standard drinks of alcohol    Types: 2 Cans of beer per week    Comment: occasional alcohol use   Drug use: Never   Sexual activity: Not Currently  Other Topics Concern   Not on file  Social History Narrative   Lives with mother, family   Social Drivers of Health   Tobacco Use: Low Risk (02/10/2024)   Patient History    Smoking Tobacco Use: Never    Smokeless Tobacco Use: Never    Passive Exposure: Not on file  Financial Resource Strain: Not on file  Food Insecurity: Not on file  Transportation Needs: Not on file  Physical Activity: Not on file  Stress: Not on file  Social Connections: Not on file  Depression (PHQ2-9): Low Risk (06/09/2022)   Depression (PHQ2-9)    PHQ-2 Score: 0  Alcohol Screen: Not on file  Housing: Not on file  Utilities: Not on file  Health Literacy: Not on file    Allergies: No Known Allergies  Metabolic Disorder Labs: Lab Results  Component Value Date   HGBA1C 5.3 06/08/2023   MPG 105.41 06/30/2019   No results found for: PROLACTIN Lab Results  Component Value Date   CHOL 140 06/08/2023   TRIG 44 06/08/2023   HDL 57 06/08/2023   CHOLHDL 2.5 06/08/2023   VLDL 11 06/30/2019   LDLCALC 73 06/08/2023   LDLCALC 69 03/20/2021   Lab Results  Component Value Date   TSH 1.312 06/30/2019   TSH 1.780 08/04/2017    Therapeutic Level Labs: No results found for: LITHIUM No results found for: VALPROATE No results found for: CBMZ  Current Medications: Current Outpatient  Medications  Medication Sig Dispense Refill   hydrocortisone  2.5 % ointment Apply topically daily as needed. 30 g 0   lamoTRIgine  (LAMICTAL ) 150 MG tablet TAKE ONE TABLET BY MOUTH TWICE A DAY 60 tablet 9   levETIRAcetam  (KEPPRA ) 500 MG tablet TAKE THREE TABLETS BY MOUTH TWICE A DAY 180 tablet 2   risperidone  (RISPERDAL ) 4 MG tablet Take 1 tablet (4 mg total) by mouth 2 (two) times daily. Take 1 tablet in the morning and take 1 tablet in the evening each day 60 tablet 2   No current facility-administered medications for this visit.     Musculoskeletal: Strength & Muscle Tone: within normal limits Gait & Station: normal Patient leans: N/A  Psychiatric Specialty Exam: Review of Systems  Psychiatric/Behavioral:  Negative for decreased concentration, dysphoric mood, hallucinations, self-injury, sleep disturbance and suicidal ideas. The patient is nervous/anxious. The patient is not hyperactive.     Blood pressure 100/70, pulse 86, temperature 97.6 F (36.4 C), temperature source Oral, height 5' 2 (1.575  m), weight 185 lb 12.8 oz (84.3 kg), SpO2 100%.Body mass index is 33.98 kg/m.  General Appearance: Casual  Eye Contact:  Fair  Speech:  Clear and Coherent  Volume:  Normal  Mood:  Euthymic  Affect:  Appropriate  Thought Process:  Linear  Orientation:  Full (Time, Place, and Person)  Thought Content: WDL   Suicidal Thoughts:  No  Homicidal Thoughts:  No  Memory:  Immediate;   Fair Recent;   Fair Remote;   Poor  Judgement:  Poor  Insight:  Lacking  Psychomotor Activity:  Normal  Concentration:  Concentration: Fair and Attention Span: Fair  Recall:  [employer] of Knowledge: Fair  Language: Fair  Akathisia:  No  Handed:    AIMS (if indicated): not done  Assets:  Engineer, Maintenance Social Support  ADL's:  Intact  Cognition: Impaired,  Moderate  Sleep:  Patient is unsure of the amount of hours she sleeps    Screenings: AIMS    Flowsheet Row Clinical Support  from 02/10/2024 in [facility] Clinical Support from 12/23/2023 in [facility] Clinical Support from 08/19/2023 in [facility] Clinical Support from 06/24/2023 in [facility]) [facility]  AIMS Total Score 0 0 0 0 0   AUDIT    Flowsheet Row Ad[facility]) [facility] Ad[facility] (Discharged) from [facility]2019 [facility] Ad[facility] (Discharged) [facility]  Alcohol Use Disorder Identification Test Final Score (AUDIT) 0 0 0   PHQ2-9    Flowsheet Row Office Visit from 06/09/2022 in [facility] [place]. [facility] Office Visit from 06/21/2021 in [facility] - [facility] [place] [facility] Office Visit from 10/20/2019 in [facility] - [facility] [place] ED from 09/18/2019 in [facility] Emergency Department at [facility] Office Visit from 07/11/2019 in [facility] - [facility] Dept Of [place]. [facility]  PHQ-2 Total Score 0 0 3 4 0  PHQ-9 Total Score 0 -- 11 13 --   Flowsheet Row Clinical Support from 02/10/2024 in [facility] Clinical Support from 12/23/2023 in [facility] Clinical Support from 10/16/2023 in [facility]  C-SSRS RISK CATEGORY Moderate Risk High Risk Moderate Risk     Assessment and Plan:   Brihanna A. Placke is a 33 year old, African-American female with a past psychiatric history significant for adjustment disorder and bipolar disorder who presents to [facility], accompanied by her social worker Bernerd). Patient presents  today for follow-up regarding her medication.  Interpretive services were utilized during the duration of the encounter.  Patient presents to the encounter stating that she has been taking her medications regularly.  This was further verified by collateral obtained from the patient's sister.  Patient does not appear to be experiencing any adverse side effects associated with her use of risperidone .  An aims assessment was performed with the patient scoring of 0.  Patient denies overt depressive symptoms nor does she endorse anxiety. Patient is unable to perform a GAD-7 or PHQ-9 screen.  It is difficult to determine if patient is exhibiting true auditory/visual hallucinations; however, patient  does deny active auditory/visual hallucinations during the assessment.  Collateral was obtained from the patient's sister with the patient's sister stating that the patient has been taking her medication regularly.  She reports that the patient has not been experiencing any issues within the home nor has she expressed concerns of wanting to harm herself.  She does report that the patient appears to be losing weight.  Provider recommended that she set up a follow-up appointment for the patient with her family medicine provider.  Patient's weight was found to be 185 lbs prior to the conclusion of the encounter.  Patient's weight during her previous encounter was 181 lbs.  Patient appears to be stable on her current medication regimen and will continue taking her medications as prescribed.   Patient had an EKG performed on 12/30/2023.  Her QTc was found to be 421 ms.  Patient's labs are up-to-date.  A [place] Suicide Severity Rating Scale was performed with the patient being considered moderate risk.  Patient denies suicidal ideations and is able to contract for safety at this time.  Safety planning was discussed with the patient prior to the conclusion of the encounter.  - Patient was instructed to contact 911 in the  event of a mental health crisis. - Patient was instructed to contact 988 Suicide and Crisis Lifeline in the event of a mental health crisis. - Patient was instructed to present to [facility] in the event of a mental health crisis.  Collaboration of Care: Collaboration of Care: Medication Management AEB provider managing patient's psychiatric medications and Psychiatrist AEB patient being followed by a mental health provider  Patient/Guardian was advised Release of Information must be obtained prior to any record release in order to collaborate their care with an outside provider. Patient/Guardian was advised if they have not already done so to contact the registration department to sign all necessary forms in order for us  to release information regarding their care.   Consent: Patient/Guardian gives verbal consent for treatment and assignment of benefits for services provided during this visit. Patient/Guardian expressed understanding and agreed to proceed.   1. Bipolar affective disorder, current episode mixed, current episode severity unspecified (HCC) (Primary)  - risperidone  (RISPERDAL ) 4 MG tablet; Take 1 tablet (4 mg total) by mouth 2 (two) times daily. Take 1 tablet in the morning and take 1 tablet in the evening each day  Dispense: 60 tablet; Refill: 2  2. Adjustment disorder with mixed disturbance of emotions and conduct  3. 

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
## 2024-03-04 ENCOUNTER — Ambulatory Visit: Payer: MEDICAID | Admitting: Family Medicine

## 2024-03-04 ENCOUNTER — Encounter: Payer: Self-pay | Admitting: Family Medicine

## 2024-03-04 ENCOUNTER — Telehealth: Payer: Self-pay | Admitting: Family Medicine

## 2024-03-04 VITALS — BP 100/69 | HR 80 | Ht 62.0 in | Wt 184.2 lb

## 2024-03-04 DIAGNOSIS — L98499 Non-pressure chronic ulcer of skin of other sites with unspecified severity: Secondary | ICD-10-CM | POA: Diagnosis not present

## 2024-03-04 NOTE — Telephone Encounter (Signed)
 Patient has need for DME. I have ordered wound care supply. I am routing note for [facility] RN Pool.   Suzann CHRISTELLA Daring, MD
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
## 2024-03-07 NOTE — Telephone Encounter (Signed)
 Community message sent to [employer] for processing.

## 2024-03-23 NOTE — Congregational Nurse Program (Signed)
" °  Dept: [phone]   Congregational Nurse Program Note  Date of Encounter: 03/23/2024  Past Medical History: Past Medical History:  Diagnosis Date   Bipolar 1 disorder (HCC)    Cerebral palsy (HCC)    Seizures (HCC)    Suicidal behavior     Encounter Details:  Community Questionnaire - 03/23/24 1118       Questionnaire   Ask client: Do you give verbal consent for me to treat you today? Yes    Student Assistance N/A    Location Patient Served  [place]    Encounter Setting Home    Population Status Migrant farmer/Refugee/Immigrant    Insurance Medicaid    Insurance/Financial Assistance Referral N/A    Medication Have Medication Insecurities    Medical Provider Yes    Medical Referrals Made NA    Medical Appointment Completed N/A    Screenings CN Performed (remember to also record results) NA    CN Interventions Other Education;Health Counseling    ED Visit Averted N/A          Saw Patricia [name] at a home visit today. She stays hyperactive and agitated some times, with report from family members that she is very difficult to accept her daily medications. Patricia [name] is suppose to take risperidone , Keppra  and lamotrigine , but she only takes the morning medications and no evening or bedtime meds. Mom is working in the evening and night, so she is not able to give Patricia [name] those medications.  In addition, she does not have food STAMP.  Advised family about medication compliancy. Talked with sister in-law and brothers who are at home in the evening to help with giving the medication to Patricia [name].   Patricia [name], legal-guardian, about enrollment on food STAMP.  Patricia Pessoa,MD Peds resident  I agree with the above assessment and documentation.  Patricia Sherrita Riederer RN BSN PCCN  [facility] [phone]-cell [phone]-office  "

## 2024-04-01 NOTE — Progress Notes (Unsigned)
" ° ° °  SUBJECTIVE:   CHIEF COMPLAINT: wound check HPI:   Patricia [name] is a 34 y.o.  with history notable for intellectual disability and behavioral spells presenting for follow up.   Discussed the use of AI scribe software for clinical note transcription with the patient, who gave verbal consent to proceed.  The patient speaks Swahili as their primary language.  An interpreter was used for the entire visit.    History of Present Illness      PERTINENT  PMH / PSH/Family/Social History : seizure disorder  OBJECTIVE:   There were no vitals taken for this visit.  Today's weight:  Review of prior weights: There were no vitals filed for this visit.  Cardiac: Warm well perfused.  Capillary refill less than 3 seconds Respiratory breathing comfortably on room air Psych: Pleasant normal affect, appropriate, normal rate of speech *** Right inner leg   ASSESSMENT/PLAN:   Assessment & Plan Nonhealing skin ulcer, with unspecified severity (HCC)     Suzann Daring, MD  Family Medicine Teaching Service  [facility]   "

## 2024-04-04 ENCOUNTER — Ambulatory Visit: Payer: MEDICAID | Admitting: Family Medicine

## 2024-04-04 DIAGNOSIS — L98499 Non-pressure chronic ulcer of skin of other sites with unspecified severity: Secondary | ICD-10-CM

## 2024-04-13 ENCOUNTER — Encounter (HOSPITAL_COMMUNITY): Payer: MEDICAID | Admitting: Physician Assistant

## 2024-06-15 ENCOUNTER — Ambulatory Visit: Payer: MEDICAID | Admitting: Neurology

## 2024-12-07 ENCOUNTER — Ambulatory Visit: Payer: MEDICAID | Admitting: Physician Assistant
